# Patient Record
Sex: Male | Born: 1935 | Race: White | Hispanic: No | Marital: Married | Smoking: Former smoker
Health system: Southern US, Community
[De-identification: ages and names within clinical notes are randomized; demographics above are authoritative.]

## PROBLEM LIST (undated history)

## (undated) DIAGNOSIS — R262 Difficulty in walking, not elsewhere classified: Secondary | ICD-10-CM

## (undated) DIAGNOSIS — K219 Gastro-esophageal reflux disease without esophagitis: Secondary | ICD-10-CM

## (undated) DIAGNOSIS — J189 Pneumonia, unspecified organism: Secondary | ICD-10-CM

## (undated) DIAGNOSIS — D649 Anemia, unspecified: Secondary | ICD-10-CM

## (undated) DIAGNOSIS — I10 Essential (primary) hypertension: Secondary | ICD-10-CM

## (undated) DIAGNOSIS — I2699 Other pulmonary embolism without acute cor pulmonale: Secondary | ICD-10-CM

## (undated) DIAGNOSIS — M6281 Muscle weakness (generalized): Secondary | ICD-10-CM

## (undated) DIAGNOSIS — N390 Urinary tract infection, site not specified: Secondary | ICD-10-CM

## (undated) DIAGNOSIS — N3041 Irradiation cystitis with hematuria: Secondary | ICD-10-CM

## (undated) DIAGNOSIS — N400 Enlarged prostate without lower urinary tract symptoms: Secondary | ICD-10-CM

## (undated) DIAGNOSIS — C61 Malignant neoplasm of prostate: Secondary | ICD-10-CM

## (undated) DIAGNOSIS — M1990 Unspecified osteoarthritis, unspecified site: Secondary | ICD-10-CM

## (undated) HISTORY — DX: Muscle weakness (generalized): M62.81

## (undated) HISTORY — DX: Urinary tract infection, site not specified: N39.0

## (undated) HISTORY — DX: Malignant neoplasm of prostate: C61

## (undated) HISTORY — DX: Irradiation cystitis with hematuria: N30.41

## (undated) HISTORY — PX: TOTAL KNEE ARTHROPLASTY: SHX125

## (undated) HISTORY — DX: Gastro-esophageal reflux disease without esophagitis: K21.9

## (undated) HISTORY — DX: Pneumonia, unspecified organism: J18.9

## (undated) HISTORY — DX: Other pulmonary embolism without acute cor pulmonale: I26.99

## (undated) HISTORY — DX: Difficulty in walking, not elsewhere classified: R26.2

## (undated) HISTORY — DX: Benign prostatic hyperplasia without lower urinary tract symptoms: N40.0

## (undated) HISTORY — DX: Anemia, unspecified: D64.9

## (undated) SURGERY — IVC FILTER INSERTION
Anesthesia: Moderate Sedation

---
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
## 1997-10-18 ENCOUNTER — Encounter: Admission: RE | Admit: 1997-10-18 | Discharge: 1998-01-16 | Payer: Self-pay | Admitting: Internal Medicine

## 1997-10-18 ENCOUNTER — Encounter: Admission: RE | Admit: 1997-10-18 | Discharge: 1998-01-16 | Payer: Self-pay | Admitting: Orthopedic Surgery

## 1999-01-15 ENCOUNTER — Other Ambulatory Visit: Admission: RE | Admit: 1999-01-15 | Discharge: 1999-01-15 | Payer: Self-pay | Admitting: *Deleted

## 1999-02-06 ENCOUNTER — Encounter: Admission: RE | Admit: 1999-02-06 | Discharge: 1999-02-17 | Payer: Self-pay | Admitting: Radiation Oncology

## 1999-02-18 ENCOUNTER — Encounter: Admission: RE | Admit: 1999-02-18 | Discharge: 1999-05-19 | Payer: Self-pay | Admitting: Radiation Oncology

## 2003-04-18 ENCOUNTER — Encounter: Payer: Self-pay | Admitting: *Deleted

## 2003-04-18 ENCOUNTER — Encounter: Admission: RE | Admit: 2003-04-18 | Discharge: 2003-04-18 | Payer: Self-pay | Admitting: *Deleted

## 2003-04-20 ENCOUNTER — Ambulatory Visit (HOSPITAL_BASED_OUTPATIENT_CLINIC_OR_DEPARTMENT_OTHER): Admission: RE | Admit: 2003-04-20 | Discharge: 2003-04-20 | Payer: Self-pay | Admitting: *Deleted

## 2003-04-20 ENCOUNTER — Ambulatory Visit (HOSPITAL_COMMUNITY): Admission: RE | Admit: 2003-04-20 | Discharge: 2003-04-20 | Payer: Self-pay | Admitting: *Deleted

## 2004-08-25 ENCOUNTER — Inpatient Hospital Stay: Payer: Self-pay | Admitting: General Practice

## 2004-08-29 ENCOUNTER — Encounter: Payer: Self-pay | Admitting: Internal Medicine

## 2004-09-23 ENCOUNTER — Encounter: Payer: Self-pay | Admitting: General Practice

## 2005-06-14 ENCOUNTER — Emergency Department: Payer: Self-pay | Admitting: Unknown Physician Specialty

## 2005-06-16 ENCOUNTER — Ambulatory Visit: Payer: Self-pay | Admitting: Urology

## 2007-08-09 ENCOUNTER — Other Ambulatory Visit: Payer: Self-pay

## 2007-08-09 ENCOUNTER — Ambulatory Visit: Payer: Self-pay | Admitting: General Practice

## 2007-08-22 ENCOUNTER — Inpatient Hospital Stay: Payer: Self-pay | Admitting: General Practice

## 2007-08-26 ENCOUNTER — Encounter: Payer: Self-pay | Admitting: Internal Medicine

## 2008-08-20 ENCOUNTER — Encounter: Admission: RE | Admit: 2008-08-20 | Payer: Medicare Other | Source: Ambulatory Visit | Admitting: Internal Medicine

## 2008-12-20 ENCOUNTER — Ambulatory Visit: Payer: Self-pay | Admitting: Ophthalmology

## 2009-01-02 ENCOUNTER — Ambulatory Visit: Payer: Self-pay | Admitting: Ophthalmology

## 2009-06-05 ENCOUNTER — Ambulatory Visit: Payer: Self-pay | Admitting: Ophthalmology

## 2009-06-18 ENCOUNTER — Ambulatory Visit: Payer: Self-pay | Admitting: Ophthalmology

## 2012-01-05 ENCOUNTER — Ambulatory Visit: Payer: Self-pay | Admitting: Internal Medicine

## 2012-01-18 ENCOUNTER — Ambulatory Visit: Payer: Self-pay | Admitting: Physical Medicine and Rehabilitation

## 2012-02-24 ENCOUNTER — Emergency Department: Payer: Self-pay | Admitting: Emergency Medicine

## 2012-02-24 LAB — CK TOTAL AND CKMB (NOT AT ARMC)
CK, Total: 74 U/L (ref 35–232)
CK-MB: 1.4 ng/mL (ref 0.5–3.6)

## 2012-02-24 LAB — URINALYSIS, COMPLETE
Bacteria: NONE SEEN
Bilirubin,UR: NEGATIVE
Blood: NEGATIVE
Glucose,UR: NEGATIVE mg/dL (ref 0–75)
Ketone: NEGATIVE
Leukocyte Esterase: NEGATIVE
Nitrite: NEGATIVE
Ph: 7 (ref 4.5–8.0)
Protein: NEGATIVE
RBC,UR: 2 /HPF (ref 0–5)
Specific Gravity: 1.009 (ref 1.003–1.030)
Squamous Epithelial: <1
WBC UR: 1 /HPF (ref 0–5)

## 2012-02-24 LAB — COMPREHENSIVE METABOLIC PANEL
Albumin: 2.6 g/dL — ABNORMAL LOW (ref 3.4–5.0)
Alkaline Phosphatase: 116 U/L (ref 50–136)
Anion Gap: 9 (ref 7–16)
BUN: 18 mg/dL (ref 7–18)
Bilirubin,Total: 0.5 mg/dL (ref 0.2–1.0)
Calcium, Total: 8.4 mg/dL — ABNORMAL LOW (ref 8.5–10.1)
Chloride: 101 mmol/L (ref 98–107)
Co2: 28 mmol/L (ref 21–32)
Creatinine: 0.91 mg/dL (ref 0.60–1.30)
EGFR (African American): >60
EGFR (Non-African Amer.): >60
Glucose: 100 mg/dL — ABNORMAL HIGH (ref 65–99)
Osmolality: 278 (ref 275–301)
Potassium: 3.5 mmol/L (ref 3.5–5.1)
SGOT(AST): 19 U/L (ref 15–37)
SGPT (ALT): 29 U/L (ref 12–78)
Sodium: 138 mmol/L (ref 136–145)
Total Protein: 5.8 g/dL — ABNORMAL LOW (ref 6.4–8.2)

## 2012-02-24 LAB — CBC
HCT: 33.4 % — ABNORMAL LOW (ref 40.0–52.0)
HGB: 11.5 g/dL — ABNORMAL LOW (ref 13.0–18.0)
MCH: 32 pg (ref 26.0–34.0)
MCHC: 34.5 g/dL (ref 32.0–36.0)
MCV: 93 fL (ref 80–100)
Platelet: 394 10*3/uL (ref 150–440)
RBC: 3.6 10*6/uL — ABNORMAL LOW (ref 4.40–5.90)
RDW: 14.5 % (ref 11.5–14.5)
WBC: 8.5 10*3/uL (ref 3.8–10.6)

## 2012-02-24 LAB — TSH: Thyroid Stimulating Horm: 3.14 u[IU]/mL

## 2012-02-24 LAB — PRO B NATRIURETIC PEPTIDE: B-Type Natriuretic Peptide: 598 pg/mL — ABNORMAL HIGH (ref 0–450)

## 2012-02-24 LAB — TROPONIN I: Troponin-I: <0.02 ng/mL

## 2012-04-05 ENCOUNTER — Ambulatory Visit: Payer: Self-pay | Admitting: Neurology

## 2015-12-11 ENCOUNTER — Encounter: Payer: Self-pay | Admitting: Emergency Medicine

## 2015-12-11 ENCOUNTER — Emergency Department: Payer: Medicare Other

## 2015-12-11 ENCOUNTER — Inpatient Hospital Stay
Admission: EM | Admit: 2015-12-11 | Discharge: 2015-12-14 | DRG: 699 | Disposition: A | Discharge status: Skilled Nursing Facility | Payer: Medicare Other | Attending: Internal Medicine | Admitting: Internal Medicine

## 2015-12-11 DIAGNOSIS — X58XXXA Exposure to other specified factors, initial encounter: Secondary | ICD-10-CM | POA: Diagnosis present

## 2015-12-11 DIAGNOSIS — Z8546 Personal history of malignant neoplasm of prostate: Secondary | ICD-10-CM

## 2015-12-11 DIAGNOSIS — Z79899 Other long term (current) drug therapy: Secondary | ICD-10-CM

## 2015-12-11 DIAGNOSIS — K219 Gastro-esophageal reflux disease without esophagitis: Secondary | ICD-10-CM | POA: Diagnosis present

## 2015-12-11 DIAGNOSIS — S3282XA Multiple fractures of pelvis without disruption of pelvic ring, initial encounter for closed fracture: Secondary | ICD-10-CM | POA: Diagnosis present

## 2015-12-11 DIAGNOSIS — Z8249 Family history of ischemic heart disease and other diseases of the circulatory system: Secondary | ICD-10-CM | POA: Diagnosis not present

## 2015-12-11 DIAGNOSIS — N3289 Other specified disorders of bladder: Secondary | ICD-10-CM | POA: Diagnosis present

## 2015-12-11 DIAGNOSIS — Z7902 Long term (current) use of antithrombotics/antiplatelets: Secondary | ICD-10-CM | POA: Diagnosis not present

## 2015-12-11 DIAGNOSIS — N3041 Irradiation cystitis with hematuria: Secondary | ICD-10-CM | POA: Diagnosis present

## 2015-12-11 DIAGNOSIS — R319 Hematuria, unspecified: Secondary | ICD-10-CM | POA: Diagnosis present

## 2015-12-11 DIAGNOSIS — K627 Radiation proctitis: Secondary | ICD-10-CM | POA: Diagnosis present

## 2015-12-11 DIAGNOSIS — R338 Other retention of urine: Secondary | ICD-10-CM

## 2015-12-11 DIAGNOSIS — I2510 Atherosclerotic heart disease of native coronary artery without angina pectoris: Secondary | ICD-10-CM | POA: Diagnosis present

## 2015-12-11 DIAGNOSIS — N3040 Irradiation cystitis without hematuria: Secondary | ICD-10-CM | POA: Diagnosis present

## 2015-12-11 DIAGNOSIS — Y842 Radiological procedure and radiotherapy as the cause of abnormal reaction of the patient, or of later complication, without mention of misadventure at the time of the procedure: Secondary | ICD-10-CM | POA: Diagnosis present

## 2015-12-11 DIAGNOSIS — Z833 Family history of diabetes mellitus: Secondary | ICD-10-CM | POA: Diagnosis not present

## 2015-12-11 DIAGNOSIS — N281 Cyst of kidney, acquired: Secondary | ICD-10-CM | POA: Diagnosis present

## 2015-12-11 DIAGNOSIS — D500 Iron deficiency anemia secondary to blood loss (chronic): Secondary | ICD-10-CM | POA: Diagnosis present

## 2015-12-11 DIAGNOSIS — I10 Essential (primary) hypertension: Secondary | ICD-10-CM | POA: Diagnosis present

## 2015-12-11 DIAGNOSIS — N3941 Urge incontinence: Secondary | ICD-10-CM | POA: Diagnosis not present

## 2015-12-11 DIAGNOSIS — N359 Urethral stricture, unspecified: Secondary | ICD-10-CM | POA: Diagnosis present

## 2015-12-11 DIAGNOSIS — R310 Gross hematuria: Secondary | ICD-10-CM

## 2015-12-11 DIAGNOSIS — Z923 Personal history of irradiation: Secondary | ICD-10-CM | POA: Diagnosis not present

## 2015-12-11 DIAGNOSIS — M1990 Unspecified osteoarthritis, unspecified site: Secondary | ICD-10-CM | POA: Diagnosis present

## 2015-12-11 DIAGNOSIS — Z87891 Personal history of nicotine dependence: Secondary | ICD-10-CM

## 2015-12-11 HISTORY — DX: Essential (primary) hypertension: I10

## 2015-12-11 HISTORY — DX: Unspecified osteoarthritis, unspecified site: M19.90

## 2015-12-11 LAB — CBC WITH DIFFERENTIAL/PLATELET
BASOS ABS: 0 10*3/uL (ref 0–0.1)
Basophils Relative: 0 %
Eosinophils Absolute: 0.1 10*3/uL (ref 0–0.7)
Eosinophils Relative: 1 %
HEMATOCRIT: 30.3 % — AB (ref 40.0–52.0)
Hemoglobin: 10.1 g/dL — ABNORMAL LOW (ref 13.0–18.0)
LYMPHS ABS: 1.2 10*3/uL (ref 1.0–3.6)
LYMPHS PCT: 20 %
MCH: 28.7 pg (ref 26.0–34.0)
MCHC: 33.3 g/dL (ref 32.0–36.0)
MCV: 86.2 fL (ref 80.0–100.0)
MONO ABS: 0.6 10*3/uL (ref 0.2–1.0)
MONOS PCT: 11 %
NEUTROS ABS: 4.2 10*3/uL (ref 1.4–6.5)
Neutrophils Relative %: 68 %
Platelets: 335 10*3/uL (ref 150–440)
RBC: 3.52 MIL/uL — ABNORMAL LOW (ref 4.40–5.90)
RDW: 15.7 % — AB (ref 11.5–14.5)
WBC: 6.1 10*3/uL (ref 3.8–10.6)

## 2015-12-11 LAB — TYPE AND SCREEN
ABO/RH(D): O POS
Antibody Screen: NEGATIVE

## 2015-12-11 LAB — CBC
HCT: 30.5 % — ABNORMAL LOW (ref 40.0–52.0)
Hemoglobin: 10.2 g/dL — ABNORMAL LOW (ref 13.0–18.0)
MCH: 28.9 pg (ref 26.0–34.0)
MCHC: 33.3 g/dL (ref 32.0–36.0)
MCV: 86.5 fL (ref 80.0–100.0)
PLATELETS: 350 10*3/uL (ref 150–440)
RBC: 3.53 MIL/uL — ABNORMAL LOW (ref 4.40–5.90)
RDW: 15.9 % — ABNORMAL HIGH (ref 11.5–14.5)
WBC: 6.2 10*3/uL (ref 3.8–10.6)

## 2015-12-11 LAB — URINALYSIS COMPLETE WITH MICROSCOPIC (ARMC ONLY)
Bacteria, UA: NONE SEEN
SPECIFIC GRAVITY, URINE: 1.015 (ref 1.005–1.030)
Squamous Epithelial / LPF: NONE SEEN
WBC, UA: NONE SEEN WBC/hpf (ref 0–5)

## 2015-12-11 LAB — BASIC METABOLIC PANEL
Anion gap: 10 (ref 5–15)
BUN: 28 mg/dL — ABNORMAL HIGH (ref 6–20)
CALCIUM: 8.6 mg/dL — AB (ref 8.9–10.3)
CO2: 22 mmol/L (ref 22–32)
CREATININE: 0.9 mg/dL (ref 0.61–1.24)
Chloride: 100 mmol/L — ABNORMAL LOW (ref 101–111)
GFR calc Af Amer: >60 mL/min (ref >60)
GFR calc non Af Amer: >60 mL/min (ref >60)
GLUCOSE: 108 mg/dL — AB (ref 65–99)
Potassium: 3.8 mmol/L (ref 3.5–5.1)
Sodium: 132 mmol/L — ABNORMAL LOW (ref 135–145)

## 2015-12-11 LAB — PROTIME-INR
INR: 1.14
Prothrombin Time: 14.8 seconds (ref 11.4–15.0)

## 2015-12-11 MED ORDER — HYDRALAZINE HCL 20 MG/ML IJ SOLN
10.0000 mg | Freq: Four times a day (QID) | INTRAMUSCULAR | Status: DC | PRN
  Filled 2015-12-11: qty 0.5

## 2015-12-11 NOTE — ED Notes (Signed)
Dr. Junious Silk at bedside with patient.

## 2015-12-11 NOTE — ED Notes (Signed)
Patient has h/o prostate cancer and does self catheters.  Patient started having bleeding today when he completed a self cath.  Patient called his [place] coordinator who told him to come here and then have the hospital transfer him to the [facility]

## 2015-12-11 NOTE — H&P (Signed)
[place] [facility] [place] NAME: Geoffrey Barber    MR#:  [id number]  DATE OF BIRTH:  August 06, 1924  DATE OF ADMISSION:  12/11/2015  PRIMARY CARE PHYSICIAN: BABAOFF, Caryl Bis, MD   REQUESTING/REFERRING PHYSICIAN: dr Marcelene Butte  CHIEF COMPLAINT:   Blood in the urine since last night HISTORY OF PRESENT ILLNESS:  Geoffrey Barber  is a 82 y.o. male with a known history of Prostate cancer, hypertension, arthritis comes to the emergency room after he started noticing hematuria since last time. Patient intermittently does self-catheterization at home started noticing some hematuria did self-catheterization 5 times today and it up getting significant amount of hematuria and got 3000 cc of CBI in the emergency room. Patient's Foley catheter was difficult to balloon inflation hands urology was consulted and is planning on doing a cystoscopy and thereafter a Foley catheter placement in the ER. Patient is asymptomatic other than some discomfort locally in the perineal area. Denies any vomiting nausea or any symptoms of hematuria in the past.  No family members present during my evaluation.  PAST MEDICAL HISTORY:   Past Medical History  Diagnosis Date  . Cancer ([place])   . Hypertension   . Arthritis     PAST SURGICAL HISTOIRY:  No past surgical history on file.  SOCIAL HISTORY:   Social History  Substance Use Topics  . Smoking status: Former Research scientist (life sciences)  . Smokeless tobacco: Not on file  . Alcohol Use: No    FAMILY HISTORY:  Hypertension, diabetes  DRUG ALLERGIES:  No Known Allergies  REVIEW OF SYSTEMS:  Review of Systems  Constitutional: Negative for fever, chills and weight loss.  HENT: Negative for ear discharge, ear pain and nosebleeds.   Eyes: Negative for blurred vision, pain and discharge.  Respiratory: Negative for sputum production, shortness of breath, wheezing and stridor.   Cardiovascular: Negative for chest pain, palpitations, orthopnea  and PND.  Gastrointestinal: Negative for nausea, vomiting, abdominal pain and diarrhea.  Genitourinary: Positive for hematuria. Negative for urgency and frequency.  Musculoskeletal: Negative for back pain and joint pain.  Neurological: Negative for sensory change, speech change, focal weakness and weakness.  Psychiatric/Behavioral: Negative for depression and hallucinations. The patient is not nervous/anxious.   All other systems reviewed and are negative.    MEDICATIONS AT HOME:   Prior to Admission medications   Medication Sig Start Date End Date Taking? Authorizing Provider  acetaminophen (TYLENOL) 325 MG tablet Take 650 mg by mouth every 6 (six) hours as needed for mild pain.   Yes Historical Provider, MD  amLODipine (NORVASC) 10 MG tablet Take 10 mg by mouth daily.   Yes Historical Provider, MD  Cholecalciferol (VITAMIN D3) 1000 units CAPS Take 1 capsule by mouth daily.   Yes Historical Provider, MD  clopidogrel (PLAVIX) 75 MG tablet Take 75 mg by mouth daily.   Yes Historical Provider, MD  dapsone 25 MG tablet Take 25 mg by mouth every other day.   Yes Historical Provider, MD  hydrochlorothiazide (HYDRODIURIL) 25 MG tablet Take 25 mg by mouth daily.   Yes Historical Provider, MD  meloxicam (MOBIC) 15 MG tablet Take 15 mg by mouth daily.   Yes Historical Provider, MD  omeprazole (PRILOSEC) 20 MG capsule Take 20 mg by mouth daily.   Yes Historical Provider, MD  potassium chloride SA (K-DUR,KLOR-CON) 20 MEQ tablet Take 20 mEq by mouth daily.   Yes Historical Provider, MD  terazosin (HYTRIN) 5 MG capsule Take 5 mg by mouth  every evening.   Yes Historical Provider, MD  traMADol (ULTRAM) 50 MG tablet Take 50 mg by mouth every 6 (six) hours as needed.   Yes Historical Provider, MD      VITAL SIGNS:  Blood pressure 154/84, pulse 73, temperature 98.4 F (36.9 C), temperature source Oral, resp. rate 18, height 6' (1.829 m), weight 99.791 kg (220 lb), SpO2 95 %.  PHYSICAL EXAMINATION:   GENERAL:  82 y.o.-year-old patient lying in the bed with no acute distress.  EYES: Pupils equal, round, reactive to light and accommodation. No scleral icterus. Extraocular muscles intact.  HEENT: Head atraumatic, normocephalic. Oropharynx and nasopharynx clear.  NECK:  Supple, no jugular venous distention. No thyroid enlargement, no tenderness.  LUNGS: Normal breath sounds bilaterally, no wheezing, rales,rhonchi or crepitation. No use of accessory muscles of respiration.  CARDIOVASCULAR: S1, S2 normal. No murmurs, rubs, or gallops.  ABDOMEN: Soft, nontender, nondistended. Bowel sounds present. No organomegaly or mass.  EXTREMITIES: No pedal edema, cyanosis, or clubbing.  NEUROLOGIC: Cranial nerves II through XII are intact. Muscle strength 5/5 in all extremities. Sensation intact. Gait not checked.  PSYCHIATRIC: The patient is alert and oriented x 3.  SKIN: No obvious rash, lesion, or ulcer.   LABORATORY PANEL:   CBC  Recent Labs Lab 12/11/15 2146  WBC 6.1  HGB 10.1*  HCT 30.3*  PLT 335   ------------------------------------------------------------------------------------------------------------------  Chemistries   Recent Labs Lab 12/11/15 1651  NA 132*  K 3.8  CL 100*  CO2 22  GLUCOSE 108*  BUN 28*  CREATININE 0.90  CALCIUM 8.6*   ------------------------------------------------------------------------------------------------------------------  Cardiac Enzymes No results for input(s): TROPONINI in the last 168 hours. ------------------------------------------------------------------------------------------------------------------  RADIOLOGY:  Ct Renal Stone Study  12/11/2015  CLINICAL DATA:  History of prostate cancer and perform self catheterization. Bleeding on self catheterization today. EXAM: CT ABDOMEN AND PELVIS WITHOUT CONTRAST TECHNIQUE: Multidetector CT imaging of the abdomen and pelvis was performed following the standard protocol without IV contrast.  COMPARISON:  CT 06/16/2005 as well as pelvic MRI 01/18/2012 FINDINGS: Lung bases demonstrate minimal scarring/ atelectasis over the lingula and left base. Calcified plaque over the left anterior descending coronary artery. Abdominal images demonstrate a few sub cm liver hypodensity is too small to characterize but likely cysts. The gallbladder, spleen, pancreas and adrenal glands are within normal. Kidneys are normal in size without evidence of hydronephrosis. Subtle smooth shaped stranding of the perirenal fat. Possible subtle faint cortical calcification upper pole right kidney. There is 6.4 cm simple cyst over the lower pole right kidney. Ureters are within normal. There is calcified plaque over the abdominal aorta iliac arteries. Appendix is normal. Colon is within normal. Small bowel is unremarkable. Pelvic images demonstrate a a focal dependent region of hyperdensity along the posterior bladder wall measuring 1.3 x 2.7 cm in AP and transverse dimension. This likely represents focal hemorrhage, although cannot exclude a mass. Rectum is normal. There is no free fluid or adenopathy. Examination demonstrates chronic displaced fracture of the right superior pubic ramus and bilateral symphysis including junction of inferior pubic rami to the symphysis. Old right inferior pubic ramus fracture. These findings were present on the previous MRI. The There is a nondisplaced fracture across the left superior acetabulum vessels in minimal displaced slightly comminuted fracture of the superior right acetabulum as these likely acute to subacute in nature. There are moderate degenerative changes of the spine with multilevel disc disease over the lumbar spine. Moderate degenerative change of the hips. IMPRESSION: 1.3 x 2.7  cm hyperdense collection along the posterior dependent portion of the bladder likely a focal region of hemorrhage, although cannot exclude a mass. No adenopathy. No renal/ureteral stones or obstruction. 6.4  cm right renal cyst. Few small subcentimeter liver hypodensities likely cysts or hemangiomas. Displaced somewhat comminuted chronic fracture involving the right superior ramus and bilateral symphysis including the junction of the inferior pubic rami bilaterally. Acute to subacute fracture left superior acetabulum without significant displacement as well as right acetabulum fracture with mild displacement and comminution. Atherosclerotic coronary artery disease. Electronically Signed   By: Marin Olp M.D.   On: 12/11/2015 20:28    EKG:    IMPRESSION AND PLAN:   Geoffrey Barber  is a 82 y.o. male with a known history of Prostate cancer, hypertension, arthritis comes to the emergency room after he started noticing hematuria since last time. Patient intermittently does self-catheterization at home started noticing some hematuria did self-catheterization 5 times today and it up getting significant amount of hematuria and got 3000 cc of CBI in the emergency room. P  1. Gross hematuria with abnormal CT scan suggestive of either bladder mass or focal hemorrhage -Admit to medical floor -Further recommendations per urology -Patient received CBI of 3 L -H&H every 12 hourly transfuse if hemoglobin less than 7 -Hold off Plavix  2. Hypertension -Continue hydrochlorothiazide and amlodipine  3. History of prostate cancer patient follows at the [place]  4. GERD continue PPI  5. DVT prophylaxis SCD and teds No antiplatelet secondary to hematuria  All the records are reviewed and case discussed with ED provider. Management plans discussed with the patient, family and they are in agreement.  CODE STATUS: full  TOTAL TIME TAKING CARE OF THIS PATIENT: 50 minutes.    Geoffrey Barber M.D on 12/11/2015 at 10:59 PM  Between 7am to 6pm - Pager - [phone]  After 6pm go to [email or web address] - password [email or web address]  Office  [phone]  CC: Primary care physician; BABAOFF, Caryl Bis,  MD

## 2015-12-11 NOTE — ED Notes (Signed)
Pt presents with blood in his urine started last night. Pt with hx of same. Pt with hx of prostate cancer.

## 2015-12-11 NOTE — Consult Note (Signed)
Consult: gross hematuria  Requested by: Dr. Marcelene Butte  History of Present Illness: 82 year old with history of radiation therapy for prostate cancer, urethral stricture and gross hematuria he developed gross hematuria earlier today. He had trouble emptying his bladder and tried to do CIC which is done in the past for urethral stricture disease but did not drain any of the urine. He came to the hospital and a three-way catheter was placed per urethra and the bladder irrigated but the balloon could not be inflated so it was removed. Patient currently feels an urge to void and is having urge incontinence. He underwent a noncontrast CT scan of the abdomen and pelvis which revealed a small clot in the bladder and otherwise benign GU tract. I reviewed the images. Is kidney function was normal. Hemoglobin of 10 with a hemoglobin of 11.5 three yrs ago. No dysuria or fever.  As above he reports a history of radiation for prostate cancer many years ago. He thinks a PSA was checked recently at the [place] and noted to be normal. He saw Dr. Jacqlyn Larsen in 2015. A PSA was less than 0.1 at that time. Also he reports he's had urethral dilations in the past for urethral stricture. He reports gross hematuria about 3 years ago and cystoscopy at the [place] which he was told was radiation cystitis but does not recall urethral dilation at that time.  Past Medical History  Diagnosis Date  . Cancer ([place])   . Hypertension   . Arthritis    No past surgical history on file.  Home Medications:   (Not in a hospital admission) Allergies: No Known Allergies  No family history on file. Social History:  reports that he has quit smoking. He does not have any smokeless tobacco history on file. He reports that he does not drink alcohol. His drug history is not on file.  ROS: A complete review of systems was performed.  All systems are negative except for pertinent findings as noted. Review of Systems  All other systems reviewed and are  negative.    Physical Exam:  Vital signs in last 24 hours: Temp:  [98.4 F (36.9 C)] 98.4 F (36.9 C) (05/24 1640) Pulse Rate:  [74] 74 (05/24 1640) Resp:  [18] 18 (05/24 1640) BP: (143)/(87) 143/87 mmHg (05/24 1640) SpO2:  [98 %] 98 % (05/24 1640) Weight:  [99.791 kg (220 lb)] 99.791 kg (220 lb) (05/24 1641) General:  Alert and oriented, No acute distress HEENT: Normocephalic, atraumatic Neck: No JVD or lymphadenopathy Cardiovascular: Regular rate and rhythm Lungs: Regular rate and effort Abdomen: Soft, nontender, nondistended, no abdominal masses Extremities: + edema Neurologic: Grossly intact  Procedure: The penis was prepped and an 15 [place] coud catheter was placed without difficulty but there was some resistance going through the prostate. The balloon was inflated and seated at the bladder neck. I irrigated the bladder to clear getting out several small clots. The catheter was left to gravity drainage.  Laboratory Data:  Results for orders placed or performed during the hospital encounter of 12/11/15 (from the past 24 hour(s))  Urinalysis complete, with microscopic- may I&O cath if menses     Status: Abnormal   Collection Time: 12/11/15  4:13 PM  Result Value Ref Range   Color, Urine RED (A) YELLOW   APPearance CLOUDY (A) CLEAR   Glucose, UA (A) NEGATIVE mg/dL    TEST NOT REPORTED DUE TO COLOR INTERFERENCE OF URINE PIGMENT   Bilirubin Urine (A) NEGATIVE    TEST NOT  REPORTED DUE TO COLOR INTERFERENCE OF URINE PIGMENT   Ketones, ur (A) NEGATIVE mg/dL    TEST NOT REPORTED DUE TO COLOR INTERFERENCE OF URINE PIGMENT   Specific Gravity, Urine 1.015 1.005 - 1.030   Hgb urine dipstick (A) NEGATIVE    TEST NOT REPORTED DUE TO COLOR INTERFERENCE OF URINE PIGMENT   pH  5.0 - 8.0    TEST NOT REPORTED DUE TO COLOR INTERFERENCE OF URINE PIGMENT   Protein, ur (A) NEGATIVE mg/dL    TEST NOT REPORTED DUE TO COLOR INTERFERENCE OF URINE PIGMENT   Nitrite (A) NEGATIVE    TEST NOT  REPORTED DUE TO COLOR INTERFERENCE OF URINE PIGMENT   Leukocytes, UA (A) NEGATIVE    TEST NOT REPORTED DUE TO COLOR INTERFERENCE OF URINE PIGMENT   RBC / HPF TOO NUMEROUS TO COUNT 0 - 5 RBC/hpf   WBC, UA NONE SEEN 0 - 5 WBC/hpf   Bacteria, UA NONE SEEN NONE SEEN   Squamous Epithelial / LPF NONE SEEN NONE SEEN  Basic metabolic panel     Status: Abnormal   Collection Time: 12/11/15  4:51 PM  Result Value Ref Range   Sodium 132 (L) 135 - 145 mmol/L   Potassium 3.8 3.5 - 5.1 mmol/L   Chloride 100 (L) 101 - 111 mmol/L   CO2 22 22 - 32 mmol/L   Glucose, Bld 108 (H) 65 - 99 mg/dL   BUN 28 (H) 6 - 20 mg/dL   Creatinine, Ser 0.90 0.61 - 1.24 mg/dL   Calcium 8.6 (L) 8.9 - 10.3 mg/dL   GFR calc non Af Amer >60 >60 mL/min   GFR calc Af Amer >60 >60 mL/min   Anion gap 10 5 - 15  CBC     Status: Abnormal   Collection Time: 12/11/15  4:51 PM  Result Value Ref Range   WBC 6.2 3.8 - 10.6 K/uL   RBC 3.53 (L) 4.40 - 5.90 MIL/uL   Hemoglobin 10.2 (L) 13.0 - 18.0 g/dL   HCT 30.5 (L) 40.0 - 52.0 %   MCV 86.5 80.0 - 100.0 fL   MCH 28.9 26.0 - 34.0 pg   MCHC 33.3 32.0 - 36.0 g/dL   RDW 15.9 (H) 11.5 - 14.5 %   Platelets 350 150 - 440 K/uL  CBC with Differential/Platelet     Status: Abnormal   Collection Time: 12/11/15  9:46 PM  Result Value Ref Range   WBC 6.1 3.8 - 10.6 K/uL   RBC 3.52 (L) 4.40 - 5.90 MIL/uL   Hemoglobin 10.1 (L) 13.0 - 18.0 g/dL   HCT 30.3 (L) 40.0 - 52.0 %   MCV 86.2 80.0 - 100.0 fL   MCH 28.7 26.0 - 34.0 pg   MCHC 33.3 32.0 - 36.0 g/dL   RDW 15.7 (H) 11.5 - 14.5 %   Platelets 335 150 - 440 K/uL   Neutrophils Relative % 68 %   Neutro Abs 4.2 1.4 - 6.5 K/uL   Lymphocytes Relative 20 %   Lymphs Abs 1.2 1.0 - 3.6 K/uL   Monocytes Relative 11 %   Monocytes Absolute 0.6 0.2 - 1.0 K/uL   Eosinophils Relative 1 %   Eosinophils Absolute 0.1 0 - 0.7 K/uL   Basophils Relative 0 %   Basophils Absolute 0.0 0 - 0.1 K/uL  Protime-INR     Status: None   Collection Time: 12/11/15   9:46 PM  Result Value Ref Range   Prothrombin Time 14.8 11.4 - 15.0 seconds  INR 1.14    No results found for this or any previous visit (from the past 240 hour(s)). 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 History of urethral stricture but 89 French coud catheter was able to be placed.  Plan:  Continue Foley catheter. If urine remains clear, foley could be discontinued for void trial. He'll need outpatient cystoscopy. Urology to follow.  Hilari Wethington 12/11/2015

## 2015-12-11 NOTE — ED Provider Notes (Signed)
Time Seen: Approximately 1830 I have reviewed the triage notes  Chief Complaint: Hematuria   History of Present Illness: Geoffrey Barber is a 82 y.o. male who presents with persistent hematuria. Patient states he started having some mild bleeding last night and then started having some heavy bleeding with urination. Patient self catheterizes himself and states that sometimes the catheter showed clogging up. The patient denies any lightheadedness. He denies much in way of abdominal pain. Patient self catheterize himself due to a history of prostate cancer that was treated with radiation pellets. The patient states he did not have any surgery on his prostate. He's had one previous episode of hematuria and had cystoscopy performed and they have did not have any lesions. The source of his pain was thought to be irritation from his previous  therapy for prostate cancer. Patient is not currently on any blood thinners. He denies any rectal bleeding.   Past Medical History  Diagnosis Date  . Cancer ([place])   . Hypertension   . Arthritis     There are no active problems to display for this patient.   No past surgical history on file.  No past surgical history on file.  Current Outpatient Rx  Name  Route  Sig  Dispense  Refill  . acetaminophen (TYLENOL) 325 MG tablet   Oral   Take 650 mg by mouth every 6 (six) hours as needed for mild pain.         Marland Kitchen amLODipine (NORVASC) 10 MG tablet   Oral   Take 10 mg by mouth daily.         . Cholecalciferol (VITAMIN D3) 1000 units CAPS   Oral   Take 1 capsule by mouth daily.         . clopidogrel (PLAVIX) 75 MG tablet   Oral   Take 75 mg by mouth daily.         . dapsone 25 MG tablet   Oral   Take 25 mg by mouth every other day.         . hydrochlorothiazide (HYDRODIURIL) 25 MG tablet   Oral   Take 25 mg by mouth daily.         . meloxicam (MOBIC) 15 MG tablet   Oral   Take 15 mg by mouth daily.         Marland Kitchen omeprazole  (PRILOSEC) 20 MG capsule   Oral   Take 20 mg by mouth daily.         . potassium chloride SA (K-DUR,KLOR-CON) 20 MEQ tablet   Oral   Take 20 mEq by mouth daily.         Marland Kitchen terazosin (HYTRIN) 5 MG capsule   Oral   Take 5 mg by mouth every evening.         . traMADol (ULTRAM) 50 MG tablet   Oral   Take 50 mg by mouth every 6 (six) hours as needed.           Allergies:  Review of patient's allergies indicates no known allergies.  Family History: No family history on file.  Social History: Social History  Substance Use Topics  . Smoking status: Former Research scientist (life sciences)  . Smokeless tobacco: None  . Alcohol Use: No     Review of Systems:   10 point review of systems was performed and was otherwise negative:  Constitutional: No fever Eyes: No visual disturbances ENT: No sore throat, ear pain Cardiac: No chest pain Respiratory:  No shortness of breath, wheezing, or stridor Abdomen: No abdominal pain, no vomiting, No diarrhea Endocrine: No weight loss, No night sweats Extremities: No peripheral edema, cyanosis Skin: No rashes, easy bruising Neurologic: No focal weakness, trouble with speech or swollowing Urologic: No dysuria, Hematuria, or urinary frequency   Physical Exam:  ED Triage Vitals  Enc Vitals Group     BP 12/11/15 1640 143/87 mmHg     Pulse Rate 12/11/15 1640 74     Resp 12/11/15 1640 18     Temp 12/11/15 1640 98.4 F (36.9 C)     Temp Source 12/11/15 1640 Oral     SpO2 12/11/15 1640 98 %     Weight 12/11/15 1641 220 lb (99.791 kg)     Height 12/11/15 1641 6' (1.829 m)     Head Cir --      Peak Flow --      Pain Score --      Pain Loc --      Pain Edu? --      Excl. in [place]? --     General: Awake , Alert , and Oriented times 3; GCS 15 Head: Normal cephalic , atraumatic Eyes: Pupils equal , round, reactive to light Nose/Throat: No nasal drainage, patent upper airway without erythema or exudate.  Neck: Supple, Full range of motion, No anterior  adenopathy or palpable thyroid masses Lungs: Clear to ascultation without wheezes , rhonchi, or rales Heart: Regular rate, regular rhythm without murmurs , gallops , or rubs Abdomen: Soft, non tender without rebound, guarding , or rigidity; bowel sounds positive and symmetric in all 4 quadrants. No organomegaly .        Extremities: 2 plus symmetric pulses. No edema, clubbing or cyanosis Neurologic: normal ambulation, Motor symmetric without deficits, sensory intact Skin: warm, dry, no rashes Examination of the penis shows some clotted blood at the tip of the meatus. He does not have any other skin lesions. No testicular pain. Labs:   All laboratory work was reviewed including any pertinent negatives or positives listed below:  Labs Reviewed  [place] ([place]) - Abnormal; Notable for the following:    Color, Urine RED (*)    APPearance CLOUDY (*)    Glucose, UA   (*)    Value: TEST NOT REPORTED DUE TO COLOR INTERFERENCE OF URINE PIGMENT   Bilirubin Urine   (*)    Value: TEST NOT REPORTED DUE TO COLOR INTERFERENCE OF URINE PIGMENT   Ketones, ur   (*)    Value: TEST NOT REPORTED DUE TO COLOR INTERFERENCE OF URINE PIGMENT   Hgb urine dipstick   (*)    Value: TEST NOT REPORTED DUE TO COLOR INTERFERENCE OF URINE PIGMENT   Protein, ur   (*)    Value: TEST NOT REPORTED DUE TO COLOR INTERFERENCE OF URINE PIGMENT   Nitrite   (*)    Value: TEST NOT REPORTED DUE TO COLOR INTERFERENCE OF URINE PIGMENT   Leukocytes, UA   (*)    Value: TEST NOT REPORTED DUE TO COLOR INTERFERENCE OF URINE PIGMENT   All other components within normal limits  BASIC METABOLIC PANEL - Abnormal; Notable for the following:    Sodium 132 (*)    Chloride 100 (*)    Glucose, Bld 108 (*)    BUN 28 (*)    Calcium 8.6 (*)    All other components within normal limits  CBC - Abnormal; Notable for the following:    RBC  3.53 (*)    Hemoglobin 10.2 (*)    HCT 30.5 (*)    RDW 15.9 (*)    All  other components within normal limits  CBC WITH DIFFERENTIAL/PLATELET  PROTIME-INR  TYPE AND SCREEN  Review of laboratory work shows some mild anemia compared to his previous hemoglobin level.   Radiology:     COMPARISON: CT 06/16/2005 as well as pelvic MRI 01/18/2012  FINDINGS: Lung bases demonstrate minimal scarring/ atelectasis over the lingula and left base. Calcified plaque over the left anterior descending coronary artery.  Abdominal images demonstrate a few sub cm liver hypodensity is too small to characterize but likely cysts. The gallbladder, spleen, pancreas and adrenal glands are within normal.  Kidneys are normal in size without evidence of hydronephrosis. Subtle smooth shaped stranding of the perirenal fat. Possible subtle faint cortical calcification upper pole right kidney. There is 6.4 cm simple cyst over the lower pole right kidney. Ureters are within normal.  There is calcified plaque over the abdominal aorta iliac arteries.  Appendix is normal. Colon is within normal. Small bowel is unremarkable.  Pelvic images demonstrate a a focal dependent region of hyperdensity along the posterior bladder wall measuring 1.3 x 2.7 cm in AP and transverse dimension. This likely represents focal hemorrhage, although cannot exclude a mass. Rectum is normal. There is no free fluid or adenopathy.  Examination demonstrates chronic displaced fracture of the right superior pubic ramus and bilateral symphysis including junction of inferior pubic rami to the symphysis. Old right inferior pubic ramus fracture. These findings were present on the previous MRI. The There is a nondisplaced fracture across the left superior acetabulum vessels in minimal displaced slightly comminuted fracture of the superior right acetabulum as these likely acute to subacute in nature.  There are moderate degenerative changes of the spine with multilevel disc disease over the lumbar spine.  Moderate degenerative change of the hips.  IMPRESSION: 1.3 x 2.7 cm hyperdense collection along the posterior dependent portion of the bladder likely a focal region of hemorrhage, although cannot exclude a mass. No adenopathy.  No renal/ureteral stones or obstruction.  6.4 cm right renal cyst.  Few small subcentimeter liver hypodensities likely cysts or hemangiomas.  Displaced somewhat comminuted chronic fracture involving the right superior ramus and bilateral symphysis including the junction of the inferior pubic rami bilaterally. Acute to subacute fracture left superior acetabulum without significant displacement as well as right acetabulum fracture with mild displacement and comminution. Atherosclerotic coronary artery disease.   Electronically Signed By: Marin Olp M.D. On: 12/11/2015 20:28   I personally reviewed the radiologic studies    ED Course: * The patient for his persistent hematuria had a three-way Foley inserted by the nursing staff and received 3 L of normal saline irrigation but still had persistent his hemoglobin is slightly lower compared to previous levels in the past and I felt required observation especially since he is still having the hematuria. Another concern is what may be some pathologic fractures noticed on his pelvis region given his history of prostate cancer. Patient was recently placed on narcotic pain medication and states he did not receive any x-ray evaluation. The patient is normally followed by the [facility] but states that he wishes to stay at our hospital for local treatment. He states if he was transferred to [facility] and would be difficult for him to get home here in [place]. Concerning his bleeding I discussed the case with Dr. Junious Silk who was on call for urology. He states he  would like to see and evaluate the patient . the Foley that was previously established was removed by the nursing staff after the bladder  irrigation.    Assessment: * Painless hematuria Multiple fractures of the pelvis      Plan:  Inpatient management          Code time  Daymon Larsen, MD 12/11/15 2217

## 2015-12-11 NOTE — ED Notes (Signed)
Patient moved to room 1. Patient alert and oriented. Awaiting ready bed. Hearing aids put in box and placed in jacket pocket in belongings bag. Glasses wrapped in paper towel, labeled and placed in belongings bag. Patient given warm blanket.

## 2015-12-12 ENCOUNTER — Encounter
Admission: RE | Admit: 2015-12-12 | Discharge: 2015-12-12 | Disposition: A | Discharge status: Home / Self Care | Payer: Medicare Other | Source: Ambulatory Visit | Attending: Internal Medicine | Admitting: Internal Medicine

## 2015-12-12 DIAGNOSIS — R319 Hematuria, unspecified: Secondary | ICD-10-CM

## 2015-12-12 LAB — CBC
HCT: 29.9 % — ABNORMAL LOW (ref 40.0–52.0)
Hemoglobin: 10.2 g/dL — ABNORMAL LOW (ref 13.0–18.0)
MCH: 28.7 pg (ref 26.0–34.0)
MCHC: 34.1 g/dL (ref 32.0–36.0)
MCV: 84.3 fL (ref 80.0–100.0)
PLATELETS: 322 10*3/uL (ref 150–440)
RBC: 3.55 MIL/uL — ABNORMAL LOW (ref 4.40–5.90)
RDW: 16.1 % — AB (ref 11.5–14.5)
WBC: 5.7 10*3/uL (ref 3.8–10.6)

## 2015-12-12 LAB — ABO/RH: ABO/RH(D): O POS

## 2015-12-12 MED ORDER — AMLODIPINE BESYLATE 10 MG PO TABS
10.0000 mg | ORAL_TABLET | Freq: Every day | ORAL | Status: DC
  Administered 2015-12-12 – 2015-12-14 (×3): 10 mg via ORAL
  Filled 2015-12-12 (×3): qty 1

## 2015-12-12 MED ORDER — ACETAMINOPHEN 325 MG PO TABS
650.0000 mg | ORAL_TABLET | Freq: Four times a day (QID) | ORAL | Status: DC | PRN
  Administered 2015-12-12 – 2015-12-14 (×4): 650 mg via ORAL
  Filled 2015-12-12 (×5): qty 2

## 2015-12-12 MED ORDER — HYDROCHLOROTHIAZIDE 25 MG PO TABS
25.0000 mg | ORAL_TABLET | Freq: Every day | ORAL | Status: DC
  Administered 2015-12-12 – 2015-12-14 (×3): 25 mg via ORAL
  Filled 2015-12-12 (×3): qty 1

## 2015-12-12 MED ORDER — DAPSONE 25 MG PO TABS
25.0000 mg | ORAL_TABLET | ORAL | Status: DC
  Administered 2015-12-12 – 2015-12-14 (×2): 25 mg via ORAL
  Filled 2015-12-12 (×2): qty 1

## 2015-12-12 MED ORDER — SODIUM CHLORIDE 0.9 % IV SOLN
INTRAVENOUS | Status: DC
  Administered 2015-12-12: 02:00:00 via INTRAVENOUS

## 2015-12-12 MED ORDER — PRESERVISION AREDS 2 PO CAPS
1.0000 | ORAL_CAPSULE | Freq: Two times a day (BID) | ORAL | Status: DC
  Administered 2015-12-12 – 2015-12-14 (×4): 1 via ORAL
  Filled 2015-12-12: qty 1

## 2015-12-12 MED ORDER — PANTOPRAZOLE SODIUM 40 MG PO TBEC
40.0000 mg | DELAYED_RELEASE_TABLET | Freq: Every day | ORAL | Status: DC
  Administered 2015-12-12 – 2015-12-14 (×3): 40 mg via ORAL
  Filled 2015-12-12 (×3): qty 1

## 2015-12-12 MED ORDER — POTASSIUM CHLORIDE CRYS ER 20 MEQ PO TBCR
20.0000 meq | EXTENDED_RELEASE_TABLET | Freq: Every day | ORAL | Status: DC
  Administered 2015-12-12 – 2015-12-14 (×3): 20 meq via ORAL
  Filled 2015-12-12 (×3): qty 1

## 2015-12-12 MED ORDER — VITAMIN D 1000 UNITS PO TABS
1000.0000 [IU] | ORAL_TABLET | Freq: Every day | ORAL | Status: DC
  Administered 2015-12-12 – 2015-12-14 (×3): 1000 [IU] via ORAL
  Filled 2015-12-12 (×3): qty 1

## 2015-12-12 MED ORDER — ONDANSETRON HCL 4 MG PO TABS: 4.0000 mg | ORAL_TABLET | Freq: Four times a day (QID) | ORAL | Status: DC | PRN

## 2015-12-12 MED ORDER — TERAZOSIN HCL 5 MG PO CAPS
5.0000 mg | ORAL_CAPSULE | Freq: Every evening | ORAL | Status: DC
  Administered 2015-12-12 – 2015-12-13 (×2): 5 mg via ORAL
  Filled 2015-12-12 (×5): qty 1

## 2015-12-12 MED ORDER — ONDANSETRON HCL 4 MG/2ML IJ SOLN: 4.0000 mg | Freq: Four times a day (QID) | INTRAMUSCULAR | Status: DC | PRN

## 2015-12-12 MED ORDER — TRAMADOL HCL 50 MG PO TABS
50.0000 mg | ORAL_TABLET | Freq: Four times a day (QID) | ORAL | Status: DC | PRN
  Administered 2015-12-13: 50 mg via ORAL
  Filled 2015-12-12: qty 1

## 2015-12-12 NOTE — Progress Notes (Signed)
[facility] NAME: Geoffrey Barber    MR#:  [id number]  DATE OF BIRTH:  1925/05/19  SUBJECTIVE:  CHIEF COMPLAINT:   Chief Complaint  Patient presents with  . Hematuria   Continues to have significant hematuria. Had no drainage and Foley due to clots in the morning and this improved after irrigation by urology. No abdominal pain.  REVIEW OF SYSTEMS:    Review of Systems  Constitutional: Negative for fever and chills.  HENT: Negative for sore throat.   Eyes: Negative for blurred vision, double vision and pain.  Respiratory: Negative for cough, hemoptysis, shortness of breath and wheezing.   Cardiovascular: Negative for chest pain, palpitations, orthopnea and leg swelling.  Gastrointestinal: Negative for heartburn, nausea, vomiting, abdominal pain, diarrhea and constipation.  Genitourinary: Positive for hematuria. Negative for dysuria.  Musculoskeletal: Negative for back pain and joint pain.  Skin: Negative for rash.  Neurological: Negative for sensory change, speech change, focal weakness and headaches.  Endo/Heme/Allergies: Does not bruise/bleed easily.  Psychiatric/Behavioral: Negative for depression. The patient is not nervous/anxious.     DRUG ALLERGIES:  No Known Allergies  VITALS:  Blood pressure 116/63, pulse 71, temperature 99.2 F (37.3 C), temperature source Oral, resp. rate 17, height 6' (1.829 m), weight 97.932 kg (215 lb 14.4 oz), SpO2 95 %.  PHYSICAL EXAMINATION:   Physical Exam  GENERAL:  82 y.o.-year-old patient lying in the bed with no acute distress.  EYES: Pupils equal, round, reactive to light and accommodation. No scleral icterus. Extraocular muscles intact.  HEENT: Head atraumatic, normocephalic. Oropharynx and nasopharynx clear.  NECK:  Supple, no jugular venous distention. No thyroid enlargement, no tenderness.  LUNGS: Normal breath sounds bilaterally, no wheezing, rales, rhonchi. No use of  accessory muscles of respiration.  CARDIOVASCULAR: S1, S2 normal. No murmurs, rubs, or gallops.  ABDOMEN: Soft, nontender, nondistended. Bowel sounds present. No organomegaly or mass.  EXTREMITIES: No cyanosis, clubbing or edema b/l.    NEUROLOGIC: Cranial nerves II through XII are intact. No focal Motor or sensory deficits b/l.   PSYCHIATRIC: The patient is alert and oriented x 3.  SKIN: No obvious rash, lesion, or ulcer.  Foley catheter in place with bloody urine  LABORATORY PANEL:   CBC  Recent Labs Lab 12/12/15 0439  WBC 5.7  HGB 10.2*  HCT 29.9*  PLT 322   ------------------------------------------------------------------------------------------------------------------ Chemistries   Recent Labs Lab 12/11/15 1651  NA 132*  K 3.8  CL 100*  CO2 22  GLUCOSE 108*  BUN 28*  CREATININE 0.90  CALCIUM 8.6*   ------------------------------------------------------------------------------------------------------------------  Cardiac Enzymes No results for input(s): TROPONINI in the last 168 hours. ------------------------------------------------------------------------------------------------------------------  RADIOLOGY:  Ct Renal Stone Study  12/11/2015  CLINICAL DATA:  History of prostate cancer and perform self catheterization. Bleeding on self catheterization today. EXAM: CT ABDOMEN AND PELVIS WITHOUT CONTRAST TECHNIQUE: Multidetector CT imaging of the abdomen and pelvis was performed following the standard protocol without IV contrast. COMPARISON:  CT 06/16/2005 as well as pelvic MRI 01/18/2012 FINDINGS: Lung bases demonstrate minimal scarring/ atelectasis over the lingula and left base. Calcified plaque over the left anterior descending coronary artery. Abdominal images demonstrate a few sub cm liver hypodensity is too small to characterize but likely cysts. The gallbladder, spleen, pancreas and adrenal glands are within normal. Kidneys are normal in size without evidence  of hydronephrosis. Subtle smooth shaped stranding of the perirenal fat. Possible subtle faint cortical calcification upper pole right kidney. There is  6.4 cm simple cyst over the lower pole right kidney. Ureters are within normal. There is calcified plaque over the abdominal aorta iliac arteries. Appendix is normal. Colon is within normal. Small bowel is unremarkable. Pelvic images demonstrate a a focal dependent region of hyperdensity along the posterior bladder wall measuring 1.3 x 2.7 cm in AP and transverse dimension. This likely represents focal hemorrhage, although cannot exclude a mass. Rectum is normal. There is no free fluid or adenopathy. Examination demonstrates chronic displaced fracture of the right superior pubic ramus and bilateral symphysis including junction of inferior pubic rami to the symphysis. Old right inferior pubic ramus fracture. These findings were present on the previous MRI. The There is a nondisplaced fracture across the left superior acetabulum vessels in minimal displaced slightly comminuted fracture of the superior right acetabulum as these likely acute to subacute in nature. There are moderate degenerative changes of the spine with multilevel disc disease over the lumbar spine. Moderate degenerative change of the hips. IMPRESSION: 1.3 x 2.7 cm hyperdense collection along the posterior dependent portion of the bladder likely a focal region of hemorrhage, although cannot exclude a mass. No adenopathy. No renal/ureteral stones or obstruction. 6.4 cm right renal cyst. Few small subcentimeter liver hypodensities likely cysts or hemangiomas. Displaced somewhat comminuted chronic fracture involving the right superior ramus and bilateral symphysis including the junction of the inferior pubic rami bilaterally. Acute to subacute fracture left superior acetabulum without significant displacement as well as right acetabulum fracture with mild displacement and comminution. Atherosclerotic  coronary artery disease. Electronically Signed   By: Marin Olp M.D.   On: 12/11/2015 20:28     ASSESSMENT AND PLAN:   Geoffrey Barber is a 82 y.o. male with a known history of Prostate cancer, hypertension, arthritis comes to the emergency room after he started noticing hematuria since last time. Patient intermittently does self-catheterization at home started noticing some hematuria did self-catheterization 5 times today and it up getting significant amount of hematuria and got 3000 cc of CBI in the emergency room. 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 -Admit to medical floor -Further recommendations per urology -Patient receiving bladder irrigation PRN - Follow Hb. Transfuse as needed -Hold  Plavix  # Hypertension -Continue hydrochlorothiazide and amlodipine  # History of prostate cancer patient follows at the [facility]  # GERD continue PPI  # DVT prophylaxis SCD and teds No anticoagulants secondary to hematuria  All the records are reviewed and case discussed with Care Management/Social Workerr. Management plans discussed with the patient, family and they are in agreement.  CODE STATUS: FULL CODE  DVT Prophylaxis: SCDs  TOTAL TIME TAKING CARE OF THIS PATIENT: 30 minutes.   POSSIBLE D/C IN  DAYS, DEPENDING ON CLINICAL CONDITION.  Hillary Bow R M.D on 12/12/2015 at 2:59 PM  Between 7am to 6pm - Pager - [phone]  After 6pm go to [email or web address] - password [email or web address] [facility]  Office  [phone]  CC: Primary care physician; BABAOFF, Caryl Bis, MD  Note: This dictation was prepared with Dragon dictation along with smaller phrase technology. Any transcriptional errors that result from this process are unintentional.

## 2015-12-12 NOTE — Progress Notes (Signed)
Urology Consult Follow Up  Subjective: Patient complaining of bladder spasms.  Staff states they had to irrigate several times during the night.  He is comfortable otherwise.  Hbg stable.  Foley in place, draining light red urine with small clots.    Anti-infectives: Anti-infectives    Start     Dose/Rate Route Frequency Ordered Stop   12/12/15 1000  dapsone tablet 25 mg     25 mg Oral Every other day 12/12/15 0041        Current Facility-Administered Medications  Medication Dose Route Frequency Provider Last Rate Last Dose  . amLODipine (NORVASC) tablet 10 mg  10 mg Oral Daily Fritzi Mandes, MD      . cholecalciferol (VITAMIN D) tablet 1,000 Units  1,000 Units Oral Daily Fritzi Mandes, MD      . dapsone tablet 25 mg  25 mg Oral QODAY Fritzi Mandes, MD      . hydrALAZINE (APRESOLINE) injection 10 mg  10 mg Intravenous Q6H PRN Fritzi Mandes, MD      . hydrochlorothiazide (HYDRODIURIL) tablet 25 mg  25 mg Oral Daily Fritzi Mandes, MD      . ondansetron (ZOFRAN) tablet 4 mg  4 mg Oral Q6H PRN Fritzi Mandes, MD       Or  . ondansetron (ZOFRAN) injection 4 mg  4 mg Intravenous Q6H PRN Fritzi Mandes, MD      . pantoprazole (PROTONIX) EC tablet 40 mg  40 mg Oral QAC breakfast Fritzi Mandes, MD      . potassium chloride SA (K-DUR,KLOR-CON) CR tablet 20 mEq  20 mEq Oral Daily Fritzi Mandes, MD      . terazosin (HYTRIN) capsule 5 mg  5 mg Oral QPM Fritzi Mandes, MD      . traMADol (ULTRAM) tablet 50 mg  50 mg Oral Q6H PRN Fritzi Mandes, MD         Objective: Vital signs in last 24 hours: Temp:  [98 F (36.7 C)-98.4 F (36.9 C)] 98 F (36.7 C) (05/25 0422) Pulse Rate:  [66-74] 72 (05/25 0422) Resp:  [18-19] 19 (05/25 0422) BP: (136-154)/(74-90) 147/90 mmHg (05/25 0422) SpO2:  [93 %-98 %] 93 % (05/25 0422) Weight:  [215 lb 14.4 oz (97.932 kg)-220 lb (99.791 kg)] 215 lb 14.4 oz (97.932 kg) (05/25 0045)  Intake/Output from previous day: 05/24 0701 - 05/25 0700 In: -  Out: 500 [Urine:500] Intake/Output this shift:      Physical Exam Constitutional: Well nourished. Alert and oriented, No acute distress. HEENT: [name] AT, moist mucus membranes. Trachea midline, no masses. Cardiovascular: No clubbing, cyanosis, or edema. Respiratory: Normal respiratory effort, no increased work of breathing. GI: Abdomen is soft, non tender, non distended, no abdominal masses.  GU: No CVA tenderness.  No bladder fullness or masses.  Patient with uncircumcised phallus.  Foley in place, draining light red tinged urine with small clots Skin: No rashes, bruises or suspicious lesions. Lymph: No cervical or inguinal adenopathy. Neurologic: Grossly intact, no focal deficits, moving all 4 extremities. Psychiatric: Normal mood and affect.  Lab Results:   Recent Labs  12/11/15 2146 12/12/15 0439  WBC 6.1 5.7  HGB 10.1* 10.2*  HCT 30.3* 29.9*  PLT 335 322   BMET  Recent Labs  12/11/15 1651  NA 132*  K 3.8  CL 100*  CO2 22  GLUCOSE 108*  BUN 28*  CREATININE 0.90  CALCIUM 8.6*   PT/INR  Recent Labs  12/11/15 2146  LABPROT 14.8  INR 1.14  ABG No results for input(s): PHART, HCO3 in the last 72 hours.  Invalid input(s): PCO2, PO2  Studies/Results: Ct Renal Stone Study  12/11/2015  CLINICAL DATA:  History of prostate cancer and perform self catheterization. Bleeding on self catheterization today. EXAM: CT ABDOMEN AND PELVIS WITHOUT CONTRAST TECHNIQUE: Multidetector CT imaging of the abdomen and pelvis was performed following the standard protocol without IV contrast. COMPARISON:  CT 06/16/2005 as well as pelvic MRI 01/18/2012 FINDINGS: Lung bases demonstrate minimal scarring/ atelectasis over the lingula and left base. Calcified plaque over the left anterior descending coronary artery. Abdominal images demonstrate a few sub cm liver hypodensity is too small to characterize but likely cysts. The gallbladder, spleen, pancreas and adrenal glands are within normal. Kidneys are normal in size without evidence of  hydronephrosis. Subtle smooth shaped stranding of the perirenal fat. Possible subtle faint cortical calcification upper pole right kidney. There is 6.4 cm simple cyst over the lower pole right kidney. Ureters are within normal. There is calcified plaque over the abdominal aorta iliac arteries. Appendix is normal. Colon is within normal. Small bowel is unremarkable. Pelvic images demonstrate a a focal dependent region of hyperdensity along the posterior bladder wall measuring 1.3 x 2.7 cm in AP and transverse dimension. This likely represents focal hemorrhage, although cannot exclude a mass. Rectum is normal. There is no free fluid or adenopathy. Examination demonstrates chronic displaced fracture of the right superior pubic ramus and bilateral symphysis including junction of inferior pubic rami to the symphysis. Old right inferior pubic ramus fracture. These findings were present on the previous MRI. The There is a nondisplaced fracture across the left superior acetabulum vessels in minimal displaced slightly comminuted fracture of the superior right acetabulum as these likely acute to subacute in nature. There are moderate degenerative changes of the spine with multilevel disc disease over the lumbar spine. Moderate degenerative change of the hips. IMPRESSION: 1.3 x 2.7 cm hyperdense collection along the posterior dependent portion of the bladder likely a focal region of hemorrhage, although cannot exclude a mass. No adenopathy. No renal/ureteral stones or obstruction. 6.4 cm right renal cyst. Few small subcentimeter liver hypodensities likely cysts or hemangiomas. Displaced somewhat comminuted chronic fracture involving the right superior ramus and bilateral symphysis including the junction of the inferior pubic rami bilaterally. Acute to subacute fracture left superior acetabulum without significant displacement as well as right acetabulum fracture with mild displacement and comminution. Atherosclerotic coronary  artery disease. Electronically Signed   By: Marin Olp M.D.   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 History of urethral stricture but 46 French coud catheter was able to be placed.  Plan:  Bladder Irrigation Due to clots patient is present today for a bladder irrigation. Patient was cleaned and prepped in a sterile fashion. 700 ml of saline/sterile water was instilled and irrigated into the bladder with a 27ml [place] syringe through the catheter in place.  After irrigation urine flow was noted, no complications were noted catheter is now draining fine.  Catheter was reattached to the overnight bag for drainage. Patient tolerated well.  Continue Foley catheter. If urine remains clear, foley could be discontinued for void trial. He'll need outpatient cystoscopy. Urology to follow.     LOS: 1 day    [facility] 12/12/2015

## 2015-12-12 NOTE — Care Management Note (Signed)
Case Management Note  Patient Details  Name: Geoffrey Barber MRN: [id number] Date of Birth: Jun 03, 1925  Subjective/Objective:                   Met with patient to discuss discharge planning. He lives with his wife at [facility] apartment/[place]. He would like to go to [facility] unit for rehab however he just walked to the nurses station with PT. He is affiliated with [facility] and they are aware that patient is here and wishes to stay at [facility] for treatment. He receives his Rx from [employer] order. He states his PCP is Dr. Loney Hering. He uses a walker to ambulate. Action/Plan: List of home health agencies left with patient. CSW updated. I have faxed notes to [facility].  RNCM will continue to follow.   Expected Discharge Date:                  Expected Discharge Plan:     In-House Referral:     Discharge planning Services  CM Consult  Post Acute Care Choice:  Home Health Choice offered to:  Patient  DME Arranged:    DME Agency:     HH Arranged:    [employer] Agency:     Status of Service:  In process, will continue to follow  Medicare Important Message Given:    Date Medicare IM Given:    Medicare IM give by:    Date Additional Medicare IM Given:    Additional Medicare Important Message give by:     If discussed at [place] of Stay Meetings, dates discussed:    Additional Comments:  Marshell Garfinkel, RN 12/12/2015, 11:35 AM

## 2015-12-12 NOTE — NC FL2 (Signed)
[place] LEVEL OF CARE SCREENING TOOL     IDENTIFICATION  Patient Name: QUENTIN GERRIOR Birthdate: 1925-06-14 Sex: male Admission Date (Current Location): 12/11/2015  [place] and [place] Number:  Engineering geologist and Address:  [facility], [address] [place], [place] [address]      Provider Number: [id number]  Attending Physician Name and Address:  Hillary Bow, MD  Relative Name and Phone Number:       Current Level of Care: Hospital Recommended Level of Care: [place] Prior Approval Number:    Date Approved/Denied:   PASRR Number:  ( [id number] )  Discharge Plan: SNF    Current Diagnoses: Patient Active Problem List   Diagnosis Date Noted  . Hematuria 12/11/2015    Orientation RESPIRATION BLADDER Height & Weight     Self, Time, Situation, Place  Normal Continent (Blood in Urine) Weight: 215 lb 14.4 oz (97.932 kg) Height:  6' (182.9 cm)  BEHAVIORAL SYMPTOMS/MOOD NEUROLOGICAL BOWEL NUTRITION STATUS   (none )  (none ) Continent Diet (Regular Diet)  AMBULATORY STATUS COMMUNICATION OF NEEDS Skin   Extensive Assist Verbally Normal                       Personal Care Assistance Level of Assistance  Bathing, Feeding, Dressing Bathing Assistance: Limited assistance Feeding assistance: Independent Dressing Assistance: Limited assistance     Functional Limitations Info  Sight, Hearing, Speech Sight Info: Impaired Hearing Info: Impaired Speech Info: Adequate    SPECIAL CARE FACTORS FREQUENCY  PT (By licensed PT), OT (By licensed OT)     PT Frequency:  (5) OT Frequency:  (5)            Contractures      Additional Factors Info  Code Status, Allergies Code Status Info:  (Full Code. ) Allergies Info:  (No Known Allergies )           Current Medications (12/12/2015):  This is the current hospital active medication list Current Facility-Administered Medications  Medication Dose  Route Frequency Provider Last Rate Last Dose  . acetaminophen (TYLENOL) tablet 650 mg  650 mg Oral Q6H PRN Srikar Sudini, MD      . amLODipine (NORVASC) tablet 10 mg  10 mg Oral Daily Fritzi Mandes, MD   10 mg at 12/12/15 0908  . cholecalciferol (VITAMIN D) tablet 1,000 Units  1,000 Units Oral Daily Fritzi Mandes, MD   1,000 Units at 12/12/15 0908  . dapsone tablet 25 mg  25 mg Oral Cherylynn Ridges, MD   25 mg at 12/12/15 0908  . hydrALAZINE (APRESOLINE) injection 10 mg  10 mg Intravenous Q6H PRN Fritzi Mandes, MD      . hydrochlorothiazide (HYDRODIURIL) tablet 25 mg  25 mg Oral Daily Fritzi Mandes, MD   25 mg at 12/12/15 0908  . ondansetron (ZOFRAN) tablet 4 mg  4 mg Oral Q6H PRN Fritzi Mandes, MD       Or  . ondansetron (ZOFRAN) injection 4 mg  4 mg Intravenous Q6H PRN Fritzi Mandes, MD      . pantoprazole (PROTONIX) EC tablet 40 mg  40 mg Oral QAC breakfast Fritzi Mandes, MD   40 mg at 12/12/15 0908  . potassium chloride SA (K-DUR,KLOR-CON) CR tablet 20 mEq  20 mEq Oral Daily Fritzi Mandes, MD   20 mEq at 12/12/15 0908  . terazosin (HYTRIN) capsule 5 mg  5 mg Oral QPM Fritzi Mandes, MD      .  traMADol (ULTRAM) tablet 50 mg  50 mg Oral Q6H PRN Fritzi Mandes, MD         Discharge Medications: Please see discharge summary for a list of discharge medications.  Relevant Imaging Results:  Relevant Lab Results:   Additional Information  (SSN: [id number])  Loralyn Freshwater, LCSW

## 2015-12-12 NOTE — Clinical Social Work Placement (Signed)
   CLINICAL SOCIAL WORK PLACEMENT  NOTE  Date:  12/12/2015  Patient Details  Name: Geoffrey Barber MRN: [id number] Date of Birth: 08-Mar-1925  Clinical Social Work is seeking post-discharge placement for this patient at the [place] level of care (*CSW will initial, date and re-position this form in  chart as items are completed):  Yes   Patient/family provided with [place] Work Department's list of facilities offering this level of care within the geographic area requested by the patient (or if unable, by the patient's family).  Yes   Patient/family informed of their freedom to choose among providers that offer the needed level of care, that participate in Medicare, Medicaid or managed care program needed by the patient, have an available bed and are willing to accept the patient.  Yes   Patient/family informed of [place]'s ownership interest in [facility] and [facility], as well as of the fact that they are under no obligation to receive care at these facilities.  PASRR submitted to EDS on       PASRR number received on       Existing PASRR number confirmed on 12/12/15     FL2 transmitted to all facilities in geographic area requested by pt/family on 12/12/15     FL2 transmitted to all facilities within larger geographic area on       Patient informed that his/her managed care company has contracts with or will negotiate with certain facilities, including the following:        Yes   Patient/family informed of bed offers received.  Patient chooses bed at  [facility]) )     Physician recommends and patient chooses bed at      Patient to be transferred to   on  .  Patient to be transferred to facility by       Patient family notified on   of transfer.  Name of family member notified:        PHYSICIAN       Additional Comment:    _______________________________________________ Loralyn Freshwater,  LCSW 12/12/2015, 11:17 AM

## 2015-12-12 NOTE — Progress Notes (Signed)
Foley irrigated, many blood clots came out. Foley draining now. Will irrigate again in an hour.

## 2015-12-12 NOTE — Progress Notes (Signed)
Physical Therapy Evaluation Patient Details Name: Geoffrey Barber MRN: [id number] DOB: [birth year] Today's Date: 12/12/2015   History of Present Illness  Geoffrey Barber is a 82 y.o. male with a known history of Prostate cancer, hypertension, arthritis comes to the emergency room after he started noticing hematuria since last time.   Clinical Impression  Pt presents to PT with decreased functional mobility and weakness and would benefit from acute PT services to address objective findings.  Pt uses a  RW at baseline and walks with a slow gait and decreased toe clearance on L foot putting pt at risk of falls.  Pt requires Min A for bed mobility and transfers.  Pt also reports he has trouble getting in and out of his car prior to admission.  Recommend f/u services at SNF level of care in order for pt to return safely to independent living apartment with wife.    Follow Up Recommendations SNF    Equipment Recommendations  Rolling walker with 5" wheels (has own RW)    Recommendations for Other Services       Precautions / Restrictions Precautions Precautions: Fall Precaution Comments: Mod Restrictions Weight Bearing Restrictions: No      Mobility  Bed Mobility Overal bed mobility: Needs Assistance Bed Mobility: Supine to Sit;Sit to Supine     Supine to sit: Min guard Sit to supine: Min assist   General bed mobility comments: Exiting to R side, uses R rail to roll, elevated trunk using R UE, slow to move legs to EOB, increased time.  Transfers Overall transfer level: Needs assistance Equipment used: Rolling walker (2 wheeled) Transfers: Sit to/from Stand Sit to Stand: Min assist         General transfer comment: Decreased anterior weight shift and slow lift off from bed using B UE's on seated surface, Min A for balance during hand transfer to RW.  Ambulation/Gait   Ambulation Distance (Feet): 40 Feet Assistive device: Rolling walker (2 wheeled) Gait Pattern/deviations:  Step-through pattern;Decreased step length - left;Steppage;Narrow base of support Gait velocity: very slow Gait velocity interpretation: Below normal speed for age/gender General Gait Details: Slow gait with L foot drop, toes dragging on floor and heavy lean on RW with forward flexed posture; decreased balance/safety with turns (pt turns walker too far in front of body before moving feet.)  Stairs            Wheelchair Mobility    Modified Rankin (Stroke Patients Only)       Balance Overall balance assessment: Needs assistance Sitting-balance support: Bilateral upper extremity supported;Feet supported Sitting balance-Leahy Scale: Good   Postural control: Posterior lean Standing balance support: Bilateral upper extremity supported;During functional activity Standing balance-Leahy Scale: Fair                               Pertinent Vitals/Pain Pain Assessment: No/denies pain    Home Living Family/patient expects to be discharged to:: Private residence Living Arrangements: Spouse/significant other   Type of Home: Apartment Home Access: Elevator     Home Layout: One level Home Equipment: Environmental consultant - 2 wheels;Bedside commode;Cane - quad;Cane - single point;Grab bars - toilet;Walker - 4 wheels      Prior Function Level of Independence: Independent with assistive device(s)         Comments: wife helps with buttons and shoes     Hand Dominance        Extremity/Trunk Assessment   Upper  Extremity Assessment: Generalized weakness;LUE deficits/detail       LUE Deficits / Details: L UE weakness from history of CVA   Lower Extremity Assessment: Generalized weakness;LLE deficits/detail   LLE Deficits / Details: L foot drop with ambulation      Communication   Communication: No difficulties  Cognition Arousal/Alertness: Awake/alert Behavior During Therapy: WFL for tasks assessed/performed Overall Cognitive Status: Within Functional Limits for tasks  assessed                      General Comments General comments (skin integrity, edema, etc.): foley with pink drainage    Exercises        Assessment/Plan    PT Assessment Patient needs continued PT services  PT Diagnosis Difficulty walking;Generalized weakness;Hemiplegia non-dominant side   PT Problem List Decreased strength;Decreased range of motion;Decreased activity tolerance;Decreased balance;Decreased mobility;Decreased safety awareness  PT Treatment Interventions Gait training;Functional mobility training;Therapeutic activities;Therapeutic exercise;Balance training;Patient/family education   PT Goals (Current goals can be found in the Care Plan section) Acute Rehab PT Goals Patient Stated Goal: To get stronger. PT Goal Formulation: With patient/family Time For Goal Achievement: 12/19/15 Potential to Achieve Goals: Good    Frequency Min 2X/week   Barriers to discharge        Co-evaluation               End of Session Equipment Utilized During Treatment: Gait belt Activity Tolerance: Patient tolerated treatment well Patient left: in bed;with call bell/phone within reach;with nursing/sitter in room;with family/visitor present Nurse Communication: Mobility status         Time: 1028-1110 PT Time Calculation (min) (ACUTE ONLY): 42 min   Charges:   PT Evaluation $PT Eval Moderate Complexity: 1 Procedure PT Treatments $Gait Training: 8-22 mins   PT G Codes:        Deray Dawes A Teonna Coonan, PT 12/12/2015, 11:14 AM

## 2015-12-12 NOTE — Clinical Social Work Note (Signed)
Clinical Social Work Assessment  Patient Details  Name: Geoffrey Barber MRN: [id number] Date of Birth: Jul 02, 1925  Date of referral:  12/12/15               Reason for consult:  Facility Placement, Other (Comment Required) (From Independent Living at [place]. )                Permission sought to share information with:  Chartered certified accountant granted to share information::  Yes, Verbal Permission Granted  Name::      Geophysicist/field seismologist::   [place]   Relationship::     Contact Information:     Housing/Transportation Living arrangements for the past 2 months:  Charity fundraiser of Information:  Patient, Spouse Patient Interpreter Needed:  None Criminal Activity/Legal Involvement Pertinent to Current Situation/Hospitalization:  No - Comment as needed Significant Relationships:  Spouse Lives with:  Spouse Do you feel safe going back to the place where you live?  Yes Need for family participation in patient care:  Yes (Comment)  Care giving concerns:  Patient and his wife live at the independent living section at [place].    Social Worker assessment / plan:  Holiday representative ([place]) received verbal consult from RN Case Freight forwarder that patient wants to go to the [facility] at [place]. PT is recommending SNF. CSW met with patient and his wife Geoffrey Barber was at bedside. CSW introduced self and explained role of CSW department. Patient was alert and oriented. CSW explained that patient will need a 3 night qualifying inpatient stay at the hospital in order for Medicare to cover SNF. Patient and wife requested for patient to go to [place]. Per Kim admissions coordinator at [facility] patient can [facility] the [facility]. Plan is for patient to D/C to [facility] on Saturday 12/14/15 pending medical clearance. CSW will continue to follow and assist as needed.   Employment status:  Retired Forensic scientist:   Medicare PT Recommendations:  [place] / Referral to community resources:  [place]  Patient/Family's Response to care:  Patient and wife are agreeable for patient to go to [facility]   Patient/Family's Understanding of and Emotional Response to Diagnosis, Current Treatment, and Prognosis:  Patient and wife were pleasant and thanked CSW for visit.   Emotional Assessment Appearance:  Appears stated age Attitude/Demeanor/Rapport:    Affect (typically observed):  Accepting, Adaptable, Pleasant Orientation:  Oriented to Self, Oriented to Place, Oriented to  Time, Oriented to Situation Alcohol / Substance use:  Not Applicable Psych involvement (Current and /or in the community):  No (Comment)  Discharge Needs  Concerns to be addressed:  Discharge Planning Concerns Readmission within the last 30 days:  No Current discharge risk:  Dependent with Mobility Barriers to Discharge:  Continued Medical Work up   Loralyn Freshwater, LCSW 12/12/2015, 11:19 AM

## 2015-12-13 LAB — HEMOGLOBIN: Hemoglobin: 9.6 g/dL — ABNORMAL LOW (ref 13.0–18.0)

## 2015-12-13 MED ORDER — TRAMADOL HCL 50 MG PO TABS: 50.0000 mg | ORAL_TABLET | Freq: Four times a day (QID) | ORAL | Status: DC | PRN

## 2015-12-13 NOTE — Progress Notes (Signed)
Patient ID: Geoffrey Barber, male   DOB: 1924/12/08, 82 y.o.   MRN: [id number] [facility] PROGRESS NOTE  Geoffrey Barber [id number] DOB: 07/01/1925 DOA: 12/11/2015 PCP: Marcello Fennel, MD  HPI/Subjective: Patient feeling okay. Still has blood coming out of the Foley catheter. But better than yesterday. Yesterday he had a be flushed quite a bit secondary to clots.  Objective: Filed Vitals:   12/13/15 0859 12/13/15 1542  BP: 111/61 121/65  Pulse: 76 70  Temp: 98.8 F (37.1 C) 98.5 F (36.9 C)  Resp:  20    Filed Weights   12/11/15 1641 12/12/15 0045  Weight: 99.791 kg (220 lb) 97.932 kg (215 lb 14.4 oz)    ROS: Review of Systems  Constitutional: Negative for fever and chills.  Eyes: Negative for blurred vision.  Respiratory: Negative for cough and shortness of breath.   Cardiovascular: Negative for chest pain.  Gastrointestinal: Negative for nausea, vomiting, abdominal pain, diarrhea and constipation.  Genitourinary: Positive for hematuria. Negative for dysuria.  Musculoskeletal: Negative for joint pain.  Neurological: Negative for dizziness and headaches.   Exam: Physical Exam  Constitutional: He is oriented to person, place, and time.  HENT:  Nose: No mucosal edema.  Mouth/Throat: No oropharyngeal exudate or posterior oropharyngeal edema.  Eyes: Conjunctivae, EOM and lids are normal. Pupils are equal, round, and reactive to light.  Neck: No JVD present. Carotid bruit is not present. No edema present. No thyroid mass and no thyromegaly present.  Cardiovascular: S1 normal and S2 normal.  Exam reveals no gallop.   No murmur heard. Pulses:      Dorsalis pedis pulses are 2+ on the right side, and 2+ on the left side.  Respiratory: No respiratory distress. He has no wheezes. He has no rhonchi. He has no rales.  GI: Soft. Bowel sounds are normal. There is no tenderness.  Musculoskeletal:       Right ankle: He exhibits no swelling.       Left ankle: He exhibits no  swelling.  Lymphadenopathy:    He has no cervical adenopathy.  Neurological: He is alert and oriented to person, place, and time. No cranial nerve deficit.  Skin: Skin is warm. No rash noted. Nails show no clubbing.  Psychiatric: He has a normal mood and affect.      Data Reviewed: Basic Metabolic Panel:  Recent Labs Lab 12/11/15 1651  NA 132*  K 3.8  CL 100*  CO2 22  GLUCOSE 108*  BUN 28*  CREATININE 0.90  CALCIUM 8.6*   CBC:  Recent Labs Lab 12/11/15 1651 12/11/15 2146 12/12/15 0439 12/13/15 0542  WBC 6.2 6.1 5.7  --   NEUTROABS  --  4.2  --   --   HGB 10.2* 10.1* 10.2* 9.6*  HCT 30.5* 30.3* 29.9*  --   MCV 86.5 86.2 84.3  --   PLT 350 335 322  --      Studies: Ct Renal Stone Study  12/11/2015  CLINICAL DATA:  History of prostate cancer and perform self catheterization. Bleeding on self catheterization today. EXAM: CT ABDOMEN AND PELVIS WITHOUT CONTRAST TECHNIQUE: Multidetector CT imaging of the abdomen and pelvis was performed following the standard protocol without IV contrast. COMPARISON:  CT 06/16/2005 as well as pelvic MRI 01/18/2012 FINDINGS: Lung bases demonstrate minimal scarring/ atelectasis over the lingula and left base. Calcified plaque over the left anterior descending coronary artery. Abdominal images demonstrate a few sub cm liver hypodensity is too small to characterize but likely  cysts. The gallbladder, spleen, pancreas and adrenal glands are within normal. Kidneys are normal in size without evidence of hydronephrosis. Subtle smooth shaped stranding of the perirenal fat. Possible subtle faint cortical calcification upper pole right kidney. There is 6.4 cm simple cyst over the lower pole right kidney. Ureters are within normal. There is calcified plaque over the abdominal aorta iliac arteries. Appendix is normal. Colon is within normal. Small bowel is unremarkable. Pelvic images demonstrate a a focal dependent region of hyperdensity along the posterior  bladder wall measuring 1.3 x 2.7 cm in AP and transverse dimension. This likely represents focal hemorrhage, although cannot exclude a mass. Rectum is normal. There is no free fluid or adenopathy. Examination demonstrates chronic displaced fracture of the right superior pubic ramus and bilateral symphysis including junction of inferior pubic rami to the symphysis. Old right inferior pubic ramus fracture. These findings were present on the previous MRI. The There is a nondisplaced fracture across the left superior acetabulum vessels in minimal displaced slightly comminuted fracture of the superior right acetabulum as these likely acute to subacute in nature. There are moderate degenerative changes of the spine with multilevel disc disease over the lumbar spine. Moderate degenerative change of the hips. IMPRESSION: 1.3 x 2.7 cm hyperdense collection along the posterior dependent portion of the bladder likely a focal region of hemorrhage, although cannot exclude a mass. No adenopathy. No renal/ureteral stones or obstruction. 6.4 cm right renal cyst. Few small subcentimeter liver hypodensities likely cysts or hemangiomas. Displaced somewhat comminuted chronic fracture involving the right superior ramus and bilateral symphysis including the junction of the inferior pubic rami bilaterally. Acute to subacute fracture left superior acetabulum without significant displacement as well as right acetabulum fracture with mild displacement and comminution. Atherosclerotic coronary artery disease. Electronically Signed   By: Marin Olp M.D.   On: 12/11/2015 20:28    Scheduled Meds: . amLODipine  10 mg Oral Daily  . cholecalciferol  1,000 Units Oral Daily  . dapsone  25 mg Oral QODAY  . hydrochlorothiazide  25 mg Oral Daily  . pantoprazole  40 mg Oral QAC breakfast  . potassium chloride SA  20 mEq Oral Daily  . PRESERVISION AREDS 2  1 capsule Oral BID  . terazosin  5 mg Oral QPM    Assessment/Plan:  1. Hematuria,  abnormal CTscan showing  Debris in the bladder which could be a mass or focal hemorrhage.  Plavix on hold.  Patient seen by urology and theyrecommended discontinuing the Foley catheter when the urine is clear. Since the urine is not clear today I will reassess this tomorrow. A  Voiding trial will happen after catheter removed.his is likely a radiation cystitis 2. Essential hypertension on usual medications 3. History of prostate cancer status post radiation 4. Anemia likely from chronic blood loss 5. Gastroesophageal reflux disease on PPI  Code Status:     Code Status Orders        Start     Ordered   12/12/15 0042  Full code   Continuous     12/12/15 0041    Code Status History    Date Active Date Inactive Code Status Order ID Comments User Context   This patient has a current code status but no historical code status.     Disposition Plan: likely to rehabilitation over the weekend  Consultants:  urology  Time spent: 25 minutes  Loletha Grayer  [employer]

## 2015-12-13 NOTE — Discharge Instructions (Signed)

## 2015-12-13 NOTE — Discharge Summary (Addendum)
[facility] [place] NAME: Geoffrey Barber    MR#:  [id number]  DATE OF BIRTH:  1924-12-03  DATE OF ADMISSION:  12/11/2015 ADMITTING PHYSICIAN: Fritzi Mandes, MD  DATE OF DISCHARGE: 12/14/2015  PRIMARY CARE PHYSICIAN: BABAOFF, Caryl Bis, MD    ADMISSION DIAGNOSIS:  Hematuria [R31.9]  DISCHARGE DIAGNOSIS:  Active Problems:   Hematuria   SECONDARY DIAGNOSIS:   Past Medical History  Diagnosis Date  . Cancer ([place])   . Hypertension   . Arthritis     HOSPITAL COURSE:   1. Gross hematuria with abnormal CT scan showing debris in the bladder which could be a mass or focal hemorrhage. Plavix was stopped. Patient was seen in consultation by urology. Remove foley and voiding trial today. Patient states he does intermittent catheterizations. Patient will need cystoscopy as outpatient. Follow-up hemoglobin weekly as outpatient. Likely this is a radiation cystitis.  2. Essential hypertension continue usual medications 3. History of prostate cancer 4. Gastroesophageal reflux disease on PPI 5. Anemia likely from blood loss  DISCHARGE CONDITIONS:   Satisfactory  CONSULTS OBTAINED:  Treatment Team:  Festus Aloe, MD  DRUG ALLERGIES:  No Known Allergies  DISCHARGE MEDICATIONS:   Current Discharge Medication List    CONTINUE these medications which have CHANGED   Details  traMADol (ULTRAM) 50 MG tablet Take 1 tablet (50 mg total) by mouth every 6 (six) hours as needed. Qty: 30 tablet, Refills: 0      CONTINUE these medications which have NOT CHANGED   Details  acetaminophen (TYLENOL) 325 MG tablet Take 650 mg by mouth every 6 (six) hours as needed for mild pain.    amLODipine (NORVASC) 10 MG tablet Take 10 mg by mouth daily.    Cholecalciferol (VITAMIN D3) 1000 units CAPS Take 1 capsule by mouth daily.    dapsone 25 MG tablet Take 25 mg by mouth every other day.    hydrochlorothiazide (HYDRODIURIL) 25 MG tablet Take 25 mg by mouth  daily.    lisinopril (PRINIVIL,ZESTRIL) 20 MG tablet Take 20 mg by mouth daily.    Multiple Vitamins-Minerals (MULTIVITAMIN WITH MINERALS) tablet Take 1 tablet by mouth daily.    omeprazole (PRILOSEC) 20 MG capsule Take 20 mg by mouth daily.    potassium chloride SA (K-DUR,KLOR-CON) 20 MEQ tablet Take 20 mEq by mouth daily.    psyllium (METAMUCIL) 58.6 % powder Take 1 packet by mouth daily.    terazosin (HYTRIN) 5 MG capsule Take 5 mg by mouth every evening.      STOP taking these medications     clopidogrel (PLAVIX) 75 MG tablet      meloxicam (MOBIC) 15 MG tablet          DISCHARGE INSTRUCTIONS:    Follow-up with Dr. at rehabilitation one day  If you experience worsening of your admission symptoms, develop shortness of breath, life threatening emergency, suicidal or homicidal thoughts you must seek medical attention immediately by calling 911 or calling your MD immediately  if symptoms less severe.  You Must read complete instructions/literature along with all the possible adverse reactions/side effects for all the Medicines you take and that have been prescribed to you. Take any new Medicines after you have completely understood and accept all the possible adverse reactions/side effects.   Please note  You were cared for by a hospitalist during your hospital stay. If you have any questions about your discharge medications or the care you received while you were in the  hospital after you are discharged, you can call the unit and asked to speak with the hospitalist on call if the hospitalist that took care of you is not available. Once you are discharged, your primary care physician will handle any further medical issues. Please note that NO REFILLS for any discharge medications will be authorized once you are discharged, as it is imperative that you return to your primary care physician (or establish a relationship with a primary care physician if you do not have one) for your  aftercare needs so that they can reassess your need for medications and monitor your lab values.    Today   CHIEF COMPLAINT:   Chief Complaint  Patient presents with  . Hematuria    HISTORY OF PRESENT ILLNESS:  Geoffrey Barber  is a 82 y.o. male presented with hematuria   VITAL SIGNS:  Blood pressure 111/61, pulse 76, temperature 98.8 F (37.1 C), temperature source Oral, resp. rate 16, height 6' (1.829 m), weight 97.932 kg (215 lb 14.4 oz), SpO2 94 %.    PHYSICAL EXAMINATION:  GENERAL:  82 y.o.-year-old patient lying in the bed with no acute distress.  EYES: Pupils equal, round, reactive to light and accommodation. No scleral icterus. Extraocular muscles intact.  HEENT: Head atraumatic, normocephalic. Oropharynx and nasopharynx clear.  NECK:  Supple, no jugular venous distention. No thyroid enlargement, no tenderness.  LUNGS: Normal breath sounds bilaterally, no wheezing, rales,rhonchi or crepitation. No use of accessory muscles of respiration.  CARDIOVASCULAR: S1, S2 normal. No murmurs, rubs, or gallops.  ABDOMEN: Soft, non-tender, non-distended. Bowel sounds present. No organomegaly or mass.  EXTREMITIES:  Trace edema, no cyanosis, or clubbing.  NEUROLOGIC: Cranial nerves II through XII are intact. Muscle strength 5/5 in all extremities. Sensation intact. Gait not checked.  PSYCHIATRIC: The patient is alert and oriented x 3.  SKIN: No obvious rash, lesion, or ulcer.   DATA REVIEW:   CBC  Recent Labs Lab 12/12/15 0439 12/13/15 0542  WBC 5.7  --   HGB 10.2* 9.6*  HCT 29.9*  --   PLT 322  --     Chemistries   Recent Labs Lab 12/11/15 1651  NA 132*  K 3.8  CL 100*  CO2 22  GLUCOSE 108*  BUN 28*  CREATININE 0.90  CALCIUM 8.6*     RADIOLOGY:  Ct Renal Stone Study  12/11/2015  CLINICAL DATA:  History of prostate cancer and perform self catheterization. Bleeding on self catheterization today. EXAM: CT ABDOMEN AND PELVIS WITHOUT CONTRAST TECHNIQUE:  Multidetector CT imaging of the abdomen and pelvis was performed following the standard protocol without IV contrast. COMPARISON:  CT 06/16/2005 as well as pelvic MRI 01/18/2012 FINDINGS: Lung bases demonstrate minimal scarring/ atelectasis over the lingula and left base. Calcified plaque over the left anterior descending coronary artery. Abdominal images demonstrate a few sub cm liver hypodensity is too small to characterize but likely cysts. The gallbladder, spleen, pancreas and adrenal glands are within normal. Kidneys are normal in size without evidence of hydronephrosis. Subtle smooth shaped stranding of the perirenal fat. Possible subtle faint cortical calcification upper pole right kidney. There is 6.4 cm simple cyst over the lower pole right kidney. Ureters are within normal. There is calcified plaque over the abdominal aorta iliac arteries. Appendix is normal. Colon is within normal. Small bowel is unremarkable. Pelvic images demonstrate a a focal dependent region of hyperdensity along the posterior bladder wall measuring 1.3 x 2.7 cm in AP and transverse dimension. This likely represents  focal hemorrhage, although cannot exclude a mass. Rectum is normal. There is no free fluid or adenopathy. Examination demonstrates chronic displaced fracture of the right superior pubic ramus and bilateral symphysis including junction of inferior pubic rami to the symphysis. Old right inferior pubic ramus fracture. These findings were present on the previous MRI. The There is a nondisplaced fracture across the left superior acetabulum vessels in minimal displaced slightly comminuted fracture of the superior right acetabulum as these likely acute to subacute in nature. There are moderate degenerative changes of the spine with multilevel disc disease over the lumbar spine. Moderate degenerative change of the hips. IMPRESSION: 1.3 x 2.7 cm hyperdense collection along the posterior dependent portion of the bladder likely a  focal region of hemorrhage, although cannot exclude a mass. No adenopathy. No renal/ureteral stones or obstruction. 6.4 cm right renal cyst. Few small subcentimeter liver hypodensities likely cysts or hemangiomas. Displaced somewhat comminuted chronic fracture involving the right superior ramus and bilateral symphysis including the junction of the inferior pubic rami bilaterally. Acute to subacute fracture left superior acetabulum without significant displacement as well as right acetabulum fracture with mild displacement and comminution. Atherosclerotic coronary artery disease. Electronically Signed   By: Marin Olp M.D.   On: 12/11/2015 20:28    Management plans discussed with the patient, family and they are in agreement.  CODE STATUS:     Code Status Orders        Start     Ordered   12/12/15 0042  Full code   Continuous     12/12/15 0041    Code Status History    Date Active Date Inactive Code Status Order ID Comments User Context   This patient has a current code status but no historical code status.      TOTAL TIME TAKING CARE OF THIS PATIENT: 35 minutes.    Loletha Grayer M.D on 12/13/2015 at 1:41 PM  Between 7am to 6pm - Pager - [phone]  After 6pm go to [email or web address] - password [email or web address]  [facility] Office  [phone]  CC: Primary care physician; BABAOFF, Caryl Bis, MD

## 2015-12-13 NOTE — Progress Notes (Signed)
Urology Consult Follow Up  Subjective: No complaints.  Foley remains in place.  Light pink without clots.   Hgb stable.    Anti-infectives: Anti-infectives    Start     Dose/Rate Route Frequency Ordered Stop   12/12/15 1000  dapsone tablet 25 mg     25 mg Oral Every other day 12/12/15 0041        Current Facility-Administered Medications  Medication Dose Route Frequency Provider Last Rate Last Dose  . acetaminophen (TYLENOL) tablet 650 mg  650 mg Oral Q6H PRN Hillary Bow, MD   650 mg at 12/13/15 0951  . amLODipine (NORVASC) tablet 10 mg  10 mg Oral Daily Fritzi Mandes, MD   10 mg at 12/13/15 0952  . cholecalciferol (VITAMIN D) tablet 1,000 Units  1,000 Units Oral Daily Fritzi Mandes, MD   1,000 Units at 12/13/15 [phone]  . dapsone tablet 25 mg  25 mg Oral Cherylynn Ridges, MD   25 mg at 12/12/15 0908  . hydrALAZINE (APRESOLINE) injection 10 mg  10 mg Intravenous Q6H PRN Fritzi Mandes, MD      . hydrochlorothiazide (HYDRODIURIL) tablet 25 mg  25 mg Oral Daily Fritzi Mandes, MD   25 mg at 12/13/15 0952  . ondansetron (ZOFRAN) tablet 4 mg  4 mg Oral Q6H PRN Fritzi Mandes, MD       Or  . ondansetron (ZOFRAN) injection 4 mg  4 mg Intravenous Q6H PRN Fritzi Mandes, MD      . pantoprazole (PROTONIX) EC tablet 40 mg  40 mg Oral QAC breakfast Fritzi Mandes, MD   40 mg at 12/13/15 0952  . potassium chloride SA (K-DUR,KLOR-CON) CR tablet 20 mEq  20 mEq Oral Daily Fritzi Mandes, MD   20 mEq at 12/13/15 0952  . PRESERVISION AREDS 2 CAPS 1 capsule  1 capsule Oral BID Hillary Bow, MD   1 capsule at 12/13/15 0953  . terazosin (HYTRIN) capsule 5 mg  5 mg Oral QPM Fritzi Mandes, MD   5 mg at 12/12/15 2258  . traMADol (ULTRAM) tablet 50 mg  50 mg Oral Q6H PRN Fritzi Mandes, MD         Objective: Vital signs in last 24 hours: Temp:  [97.6 F (36.4 C)-98.8 F (37.1 C)] 98.5 F (36.9 C) (05/26 1542) Pulse Rate:  [70-78] 70 (05/26 1542) Resp:  [16-22] 20 (05/26 1542) BP: (111-121)/(61-65) 121/65 mmHg (05/26 1542) SpO2:  [94  %-98 %] 95 % (05/26 1542)  Intake/Output from previous day: 05/25 0701 - 05/26 0700 In: 240 [P.O.:240] Out: 1975 [Urine:1975] Intake/Output this shift: Total I/O In: 480 [P.O.:480] Out: 550 [Urine:550]   Physical Exam Constitutional: Well nourished. Alert and oriented, No acute distress.  Eating dinnner.    HEENT: [place] AT, moist mucus membranes. Trachea midline, no masses. Cardiovascular: No clubbing, cyanosis, or edema. Respiratory: Normal respiratory effort, no increased work of breathing. GI: Abdomen is soft, non tender, non distended, no abdominal masses.  GU: No CVA tenderness.  No bladder fullness or masses.  Patient with uncircumcised phallus.  Foley in place, light pink  Urine with no clots.   Neurologic: Grossly intact, no focal deficits, moving all 4 extremities. Psychiatric: Normal mood and affect.  Lab Results:   Recent Labs  12/11/15 2146 12/12/15 0439 12/13/15 0542  WBC 6.1 5.7  --   HGB 10.1* 10.2* 9.6*  HCT 30.3* 29.9*  --   PLT 335 322  --    BMET  Recent Labs  12/11/15 1651  NA 132*  K 3.8  CL 100*  CO2 22  GLUCOSE 108*  BUN 28*  CREATININE 0.90  CALCIUM 8.6*   PT/INR  Recent Labs  12/11/15 2146  LABPROT 14.8  INR 1.14   ABG No results for input(s): PHART, HCO3 in the last 72 hours.  Invalid input(s): PCO2, PO2  Studies/Results: Ct Renal Stone Study  12/11/2015  CLINICAL DATA:  History of prostate cancer and perform self catheterization. Bleeding on self catheterization today. EXAM: CT ABDOMEN AND PELVIS WITHOUT CONTRAST TECHNIQUE: Multidetector CT imaging of the abdomen and pelvis was performed following the standard protocol without IV contrast. COMPARISON:  CT 06/16/2005 as well as pelvic MRI 01/18/2012 FINDINGS: Lung bases demonstrate minimal scarring/ atelectasis over the lingula and left base. Calcified plaque over the left anterior descending coronary artery. Abdominal images demonstrate a few sub cm liver hypodensity is too  small to characterize but likely cysts. The gallbladder, spleen, pancreas and adrenal glands are within normal. Kidneys are normal in size without evidence of hydronephrosis. Subtle smooth shaped stranding of the perirenal fat. Possible subtle faint cortical calcification upper pole right kidney. There is 6.4 cm simple cyst over the lower pole right kidney. Ureters are within normal. There is calcified plaque over the abdominal aorta iliac arteries. Appendix is normal. Colon is within normal. Small bowel is unremarkable. Pelvic images demonstrate a a focal dependent region of hyperdensity along the posterior bladder wall measuring 1.3 x 2.7 cm in AP and transverse dimension. This likely represents focal hemorrhage, although cannot exclude a mass. Rectum is normal. There is no free fluid or adenopathy. Examination demonstrates chronic displaced fracture of the right superior pubic ramus and bilateral symphysis including junction of inferior pubic rami to the symphysis. Old right inferior pubic ramus fracture. These findings were present on the previous MRI. The There is a nondisplaced fracture across the left superior acetabulum vessels in minimal displaced slightly comminuted fracture of the superior right acetabulum as these likely acute to subacute in nature. There are moderate degenerative changes of the spine with multilevel disc disease over the lumbar spine. Moderate degenerative change of the hips. IMPRESSION: 1.3 x 2.7 cm hyperdense collection along the posterior dependent portion of the bladder likely a focal region of hemorrhage, although cannot exclude a mass. No adenopathy. No renal/ureteral stones or obstruction. 6.4 cm right renal cyst. Few small subcentimeter liver hypodensities likely cysts or hemangiomas. Displaced somewhat comminuted chronic fracture involving the right superior ramus and bilateral symphysis including the junction of the inferior pubic rami bilaterally. Acute to subacute fracture  left superior acetabulum without significant displacement as well as right acetabulum fracture with mild displacement and comminution. Atherosclerotic coronary artery disease. Electronically Signed   By: Marin Olp M.D.   On: 12/11/2015 20:28     Assessment: Impression/Assessment:  Gross hematuria-likely radiation cystitis, improving. History of urethral stricture but 72 French coud catheter was able to be placed.  Plan:  Hematuria adequately cleared Recommend Foley catheter removal early tumor morning Outpatient urology follow-up in 2 weeks for cystoscopy Urology will sign off, please contact us if you have any questions, concerns, or any other questions or if patient fails to void    LOS: 2 days    Hollice Espy 12/13/2015

## 2015-12-13 NOTE — Care Management (Signed)
Paged Sherren Mocha PA for SNF orders.

## 2015-12-13 NOTE — Care Management Important Message (Signed)
Important Message  Patient Details  Name: BODEY LAVANWAY MRN: [id number] Date of Birth: [birth year]   Medicare Important Message Given:  Yes    Juliann Pulse A Aminta Sakurai 12/13/2015, 11:41 AM

## 2015-12-14 LAB — HEMOGLOBIN: Hemoglobin: 9.7 g/dL — ABNORMAL LOW (ref 13.0–18.0)

## 2015-12-14 NOTE — Progress Notes (Signed)
Report called to [facility] place.  Pt was able to void on his own without being cathed. Pt sent out via wheelchair to wifes waiting are

## 2015-12-14 NOTE — Progress Notes (Signed)
Patient ID: Geoffrey Barber, male   DOB: 10-Aug-1924, 82 y.o.   MRN: [id number] [facility] PROGRESS NOTE  Geoffrey Barber [id number] DOB: [birth year] DOA: 12/11/2015 PCP: Marcello Fennel, MD  HPI/Subjective: Patient feels okay, urine has cleared. Hemoglobin stabilized.  Objective: Filed Vitals:   12/14/15 0733 12/14/15 0917  BP: 118/71 122/70  Pulse: 69   Temp: 98.1 F (36.7 C)   Resp: 18     Filed Weights   12/11/15 1641 12/12/15 0045  Weight: 99.791 kg (220 lb) 97.932 kg (215 lb 14.4 oz)    ROS: Review of Systems  Constitutional: Negative for fever and chills.  Eyes: Negative for blurred vision.  Respiratory: Negative for cough and shortness of breath.   Cardiovascular: Negative for chest pain.  Gastrointestinal: Negative for nausea, vomiting, abdominal pain, diarrhea and constipation.  Genitourinary: Negative for dysuria and hematuria.  Musculoskeletal: Negative for joint pain.  Neurological: Negative for dizziness and headaches.   Exam: Physical Exam  Constitutional: He is oriented to person, place, and time.  HENT:  Nose: No mucosal edema.  Mouth/Throat: No oropharyngeal exudate or posterior oropharyngeal edema.  Eyes: Conjunctivae, EOM and lids are normal. Pupils are equal, round, and reactive to light.  Neck: No JVD present. Carotid bruit is not present. No edema present. No thyroid mass and no thyromegaly present.  Cardiovascular: S1 normal and S2 normal.  Exam reveals no gallop.   No murmur heard. Pulses:      Dorsalis pedis pulses are 2+ on the right side, and 2+ on the left side.  Respiratory: No respiratory distress. He has no wheezes. He has no rhonchi. He has no rales.  GI: Soft. Bowel sounds are normal. There is no tenderness.  Musculoskeletal:       Right ankle: He exhibits no swelling.       Left ankle: He exhibits no swelling.  Lymphadenopathy:    He has no cervical adenopathy.  Neurological: He is alert and oriented to person, place, and  time. No cranial nerve deficit.  Skin: Skin is warm. No rash noted. Nails show no clubbing.  Psychiatric: He has a normal mood and affect.      Data Reviewed: Basic Metabolic Panel:  Recent Labs Lab 12/11/15 1651  NA 132*  K 3.8  CL 100*  CO2 22  GLUCOSE 108*  BUN 28*  CREATININE 0.90  CALCIUM 8.6*   CBC:  Recent Labs Lab 12/11/15 1651 12/11/15 2146 12/12/15 0439 12/13/15 0542 12/14/15 0448  WBC 6.2 6.1 5.7  --   --   NEUTROABS  --  4.2  --   --   --   HGB 10.2* 10.1* 10.2* 9.6* 9.7*  HCT 30.5* 30.3* 29.9*  --   --   MCV 86.5 86.2 84.3  --   --   PLT 350 335 322  --   --     Scheduled Meds: . amLODipine  10 mg Oral Daily  . cholecalciferol  1,000 Units Oral Daily  . dapsone  25 mg Oral QODAY  . hydrochlorothiazide  25 mg Oral Daily  . pantoprazole  40 mg Oral QAC breakfast  . potassium chloride SA  20 mEq Oral Daily  . PRESERVISION AREDS 2  1 capsule Oral BID  . terazosin  5 mg Oral QPM    Assessment/Plan:  1. Hematuria, abnormal CTscan showing  Debris in the bladder which could be a mass or focal hemorrhage.  Plavix on hold.  Patient seen by urology and theyrecommended  discontinuing the Foley catheter this am and doing a voiding trial.  As per patient he does intermittent self catheterizations. A  Voiding trial after foley removed today. 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 2. Essential hypertension on usual medications 3. History of prostate cancer status post radiation 4. Anemia likely from chronic blood loss. hemoglobin 5. Gastroesophageal reflux disease on PPI  Code Status:     Code Status Orders        Start     Ordered   12/12/15 0042  Full code   Continuous     12/12/15 0041    Code Status History    Date Active Date Inactive Code Status Order ID Comments User Context   This patient has a current code status but no historical code status.     Disposition Plan: likely to rehabilitation over the weekend  Consultants:  urology  Time  spent: 35 minutes  Loletha Grayer  [employer]

## 2015-12-14 NOTE — Clinical Social Work Note (Signed)
Patient to discharge to [facility] today if he can void after foley removed. Patient and wife are aware and are in agreement with this. CSW informed that if patient chooses to go by ambulance, that insurance may not cover it. Patient and wife have chosen to transport via car to [place]. Patient's nurse is aware and has contact info to call report.  Shela Leff MSW,LCSW [phone]

## 2015-12-19 ENCOUNTER — Encounter
Admission: RE | Admit: 2015-12-19 | Discharge: 2015-12-19 | Disposition: A | Discharge status: Home / Self Care | Payer: Medicare Other | Source: Ambulatory Visit | Attending: Internal Medicine | Admitting: Internal Medicine

## 2015-12-19 LAB — COMPREHENSIVE METABOLIC PANEL
ALT: 18 U/L (ref 17–63)
ANION GAP: 8 (ref 5–15)
AST: 20 U/L (ref 15–41)
Albumin: 3.6 g/dL (ref 3.5–5.0)
Alkaline Phosphatase: 147 U/L — ABNORMAL HIGH (ref 38–126)
BILIRUBIN TOTAL: 0.4 mg/dL (ref 0.3–1.2)
BUN: 33 mg/dL — ABNORMAL HIGH (ref 6–20)
CHLORIDE: 100 mmol/L — AB (ref 101–111)
CO2: 24 mmol/L (ref 22–32)
Calcium: 8.6 mg/dL — ABNORMAL LOW (ref 8.9–10.3)
Creatinine, Ser: 1.12 mg/dL (ref 0.61–1.24)
GFR, EST NON AFRICAN AMERICAN: 56 mL/min — AB (ref >60)
Glucose, Bld: 116 mg/dL — ABNORMAL HIGH (ref 65–99)
POTASSIUM: 4.2 mmol/L (ref 3.5–5.1)
Sodium: 132 mmol/L — ABNORMAL LOW (ref 135–145)
TOTAL PROTEIN: 6.7 g/dL (ref 6.5–8.1)

## 2015-12-19 LAB — CBC WITH DIFFERENTIAL/PLATELET
BASOS ABS: 0 10*3/uL (ref 0–0.1)
Basophils Relative: 1 %
EOS PCT: 2 %
Eosinophils Absolute: 0.1 10*3/uL (ref 0–0.7)
HCT: 30.5 % — ABNORMAL LOW (ref 40.0–52.0)
Hemoglobin: 10.2 g/dL — ABNORMAL LOW (ref 13.0–18.0)
LYMPHS PCT: 20 %
Lymphs Abs: 1.4 10*3/uL (ref 1.0–3.6)
MCH: 28.3 pg (ref 26.0–34.0)
MCHC: 33.4 g/dL (ref 32.0–36.0)
MCV: 84.9 fL (ref 80.0–100.0)
MONO ABS: 0.6 10*3/uL (ref 0.2–1.0)
MONOS PCT: 9 %
Neutro Abs: 4.8 10*3/uL (ref 1.4–6.5)
Neutrophils Relative %: 68 %
PLATELETS: 365 10*3/uL (ref 150–440)
RBC: 3.59 MIL/uL — ABNORMAL LOW (ref 4.40–5.90)
RDW: 16.3 % — AB (ref 11.5–14.5)
WBC: 6.9 10*3/uL (ref 3.8–10.6)

## 2015-12-23 ENCOUNTER — Ambulatory Visit: Payer: Self-pay | Admitting: Urology

## 2015-12-24 LAB — CBC WITH DIFFERENTIAL/PLATELET
BASOS ABS: 0.1 10*3/uL (ref 0–0.1)
BASOS PCT: 1 %
EOS ABS: 0.1 10*3/uL (ref 0–0.7)
EOS PCT: 2 %
HEMATOCRIT: 28.9 % — AB (ref 40.0–52.0)
Hemoglobin: 9.9 g/dL — ABNORMAL LOW (ref 13.0–18.0)
Lymphocytes Relative: 22 %
Lymphs Abs: 1.2 10*3/uL (ref 1.0–3.6)
MCH: 28.9 pg (ref 26.0–34.0)
MCHC: 34.1 g/dL (ref 32.0–36.0)
MCV: 84.8 fL (ref 80.0–100.0)
MONO ABS: 0.5 10*3/uL (ref 0.2–1.0)
Monocytes Relative: 10 %
NEUTROS ABS: 3.4 10*3/uL (ref 1.4–6.5)
Neutrophils Relative %: 65 %
PLATELETS: 343 10*3/uL (ref 150–440)
RBC: 3.41 MIL/uL — ABNORMAL LOW (ref 4.40–5.90)
RDW: 16.5 % — AB (ref 11.5–14.5)
WBC: 5.3 10*3/uL (ref 3.8–10.6)

## 2015-12-24 LAB — COMPREHENSIVE METABOLIC PANEL
ALBUMIN: 3.4 g/dL — AB (ref 3.5–5.0)
ALT: 15 U/L — ABNORMAL LOW (ref 17–63)
ANION GAP: 8 (ref 5–15)
AST: 18 U/L (ref 15–41)
Alkaline Phosphatase: 131 U/L — ABNORMAL HIGH (ref 38–126)
BUN: 30 mg/dL — AB (ref 6–20)
CHLORIDE: 104 mmol/L (ref 101–111)
CO2: 22 mmol/L (ref 22–32)
Calcium: 8.5 mg/dL — ABNORMAL LOW (ref 8.9–10.3)
Creatinine, Ser: 0.97 mg/dL (ref 0.61–1.24)
GFR calc Af Amer: >60 mL/min (ref >60)
GFR calc non Af Amer: >60 mL/min (ref >60)
GLUCOSE: 113 mg/dL — AB (ref 65–99)
POTASSIUM: 3.9 mmol/L (ref 3.5–5.1)
SODIUM: 134 mmol/L — AB (ref 135–145)
Total Bilirubin: 0.3 mg/dL (ref 0.3–1.2)
Total Protein: 6.4 g/dL — ABNORMAL LOW (ref 6.5–8.1)

## 2015-12-25 ENCOUNTER — Encounter: Payer: Self-pay | Admitting: Emergency Medicine

## 2015-12-25 ENCOUNTER — Inpatient Hospital Stay
Admission: EM | Admit: 2015-12-25 | Discharge: 2016-01-02 | DRG: 853 | Disposition: A | Discharge status: Skilled Nursing Facility | Payer: Medicare Other | Source: Skilled Nursing Facility | Attending: Internal Medicine | Admitting: Internal Medicine

## 2015-12-25 DIAGNOSIS — Z923 Personal history of irradiation: Secondary | ICD-10-CM

## 2015-12-25 DIAGNOSIS — M1990 Unspecified osteoarthritis, unspecified site: Secondary | ICD-10-CM | POA: Diagnosis present

## 2015-12-25 DIAGNOSIS — R0602 Shortness of breath: Secondary | ICD-10-CM

## 2015-12-25 DIAGNOSIS — I10 Essential (primary) hypertension: Secondary | ICD-10-CM | POA: Diagnosis present

## 2015-12-25 DIAGNOSIS — J9601 Acute respiratory failure with hypoxia: Secondary | ICD-10-CM | POA: Diagnosis not present

## 2015-12-25 DIAGNOSIS — Z87891 Personal history of nicotine dependence: Secondary | ICD-10-CM | POA: Diagnosis not present

## 2015-12-25 DIAGNOSIS — N3041 Irradiation cystitis with hematuria: Secondary | ICD-10-CM | POA: Diagnosis present

## 2015-12-25 DIAGNOSIS — J9811 Atelectasis: Secondary | ICD-10-CM | POA: Diagnosis not present

## 2015-12-25 DIAGNOSIS — Z8546 Personal history of malignant neoplasm of prostate: Secondary | ICD-10-CM | POA: Diagnosis not present

## 2015-12-25 DIAGNOSIS — H9190 Unspecified hearing loss, unspecified ear: Secondary | ICD-10-CM | POA: Diagnosis present

## 2015-12-25 DIAGNOSIS — D62 Acute posthemorrhagic anemia: Secondary | ICD-10-CM | POA: Diagnosis present

## 2015-12-25 DIAGNOSIS — Y95 Nosocomial condition: Secondary | ICD-10-CM | POA: Diagnosis present

## 2015-12-25 DIAGNOSIS — R0902 Hypoxemia: Secondary | ICD-10-CM

## 2015-12-25 DIAGNOSIS — Z8551 Personal history of malignant neoplasm of bladder: Secondary | ICD-10-CM | POA: Diagnosis not present

## 2015-12-25 DIAGNOSIS — R0682 Tachypnea, not elsewhere classified: Secondary | ICD-10-CM

## 2015-12-25 DIAGNOSIS — N400 Enlarged prostate without lower urinary tract symptoms: Secondary | ICD-10-CM | POA: Diagnosis present

## 2015-12-25 DIAGNOSIS — C61 Malignant neoplasm of prostate: Secondary | ICD-10-CM | POA: Diagnosis not present

## 2015-12-25 DIAGNOSIS — R935 Abnormal findings on diagnostic imaging of other abdominal regions, including retroperitoneum: Secondary | ICD-10-CM

## 2015-12-25 DIAGNOSIS — K219 Gastro-esophageal reflux disease without esophagitis: Secondary | ICD-10-CM | POA: Diagnosis present

## 2015-12-25 DIAGNOSIS — B9620 Unspecified Escherichia coli [E. coli] as the cause of diseases classified elsewhere: Secondary | ICD-10-CM | POA: Diagnosis present

## 2015-12-25 DIAGNOSIS — J189 Pneumonia, unspecified organism: Secondary | ICD-10-CM | POA: Diagnosis present

## 2015-12-25 DIAGNOSIS — I2699 Other pulmonary embolism without acute cor pulmonale: Secondary | ICD-10-CM

## 2015-12-25 DIAGNOSIS — A4159 Other Gram-negative sepsis: Principal | ICD-10-CM | POA: Diagnosis present

## 2015-12-25 DIAGNOSIS — Y842 Radiological procedure and radiotherapy as the cause of abnormal reaction of the patient, or of later complication, without mention of misadventure at the time of the procedure: Secondary | ICD-10-CM | POA: Diagnosis present

## 2015-12-25 DIAGNOSIS — R319 Hematuria, unspecified: Secondary | ICD-10-CM | POA: Diagnosis present

## 2015-12-25 DIAGNOSIS — R7881 Bacteremia: Secondary | ICD-10-CM | POA: Diagnosis not present

## 2015-12-25 LAB — BASIC METABOLIC PANEL
Anion gap: 10 (ref 5–15)
BUN: 34 mg/dL — AB (ref 6–20)
CO2: 20 mmol/L — ABNORMAL LOW (ref 22–32)
CREATININE: 0.94 mg/dL (ref 0.61–1.24)
Calcium: 8.4 mg/dL — ABNORMAL LOW (ref 8.9–10.3)
Chloride: 101 mmol/L (ref 101–111)
GFR calc Af Amer: >60 mL/min (ref >60)
Glucose, Bld: 131 mg/dL — ABNORMAL HIGH (ref 65–99)
POTASSIUM: 4 mmol/L (ref 3.5–5.1)
SODIUM: 131 mmol/L — AB (ref 135–145)

## 2015-12-25 LAB — CBC
HEMATOCRIT: 28.9 % — AB (ref 40.0–52.0)
HEMOGLOBIN: 9.8 g/dL — AB (ref 13.0–18.0)
MCH: 28.6 pg (ref 26.0–34.0)
MCHC: 33.7 g/dL (ref 32.0–36.0)
MCV: 85 fL (ref 80.0–100.0)
Platelets: 338 10*3/uL (ref 150–440)
RBC: 3.41 MIL/uL — AB (ref 4.40–5.90)
RDW: 16.8 % — ABNORMAL HIGH (ref 11.5–14.5)
WBC: 9.9 10*3/uL (ref 3.8–10.6)

## 2015-12-25 LAB — URINALYSIS COMPLETE WITH MICROSCOPIC (ARMC ONLY)
BACTERIA UA: NONE SEEN
SPECIFIC GRAVITY, URINE: 1.023 (ref 1.005–1.030)
Squamous Epithelial / LPF: NONE SEEN

## 2015-12-25 LAB — HEMOGLOBIN: HEMOGLOBIN: 8.7 g/dL — AB (ref 13.0–18.0)

## 2015-12-25 MED ORDER — ONDANSETRON HCL 4 MG PO TABS: 4.0000 mg | ORAL_TABLET | Freq: Four times a day (QID) | ORAL | Status: DC | PRN

## 2015-12-25 MED ORDER — SODIUM CHLORIDE 0.9 % IV BOLUS (SEPSIS)
1000.0000 mL | Freq: Once | INTRAVENOUS | Status: AC
  Administered 2015-12-25: 1000 mL via INTRAVENOUS

## 2015-12-25 MED ORDER — MORPHINE SULFATE (PF) 2 MG/ML IV SOLN
2.0000 mg | Freq: Four times a day (QID) | INTRAVENOUS | Status: DC | PRN
  Administered 2015-12-25: 2 mg via INTRAVENOUS
  Filled 2015-12-25: qty 1

## 2015-12-25 MED ORDER — HYDROCODONE-ACETAMINOPHEN 5-325 MG PO TABS
1.0000 | ORAL_TABLET | ORAL | Status: DC | PRN
  Administered 2015-12-25 – 2015-12-28 (×2): 2 via ORAL
  Filled 2015-12-25: qty 2

## 2015-12-25 MED ORDER — LISINOPRIL 10 MG PO TABS
10.0000 mg | ORAL_TABLET | Freq: Every day | ORAL | Status: DC
  Administered 2015-12-25 – 2016-01-02 (×9): 10 mg via ORAL
  Filled 2015-12-25 (×9): qty 1

## 2015-12-25 MED ORDER — ADULT MULTIVITAMIN W/MINERALS CH
1.0000 | ORAL_TABLET | Freq: Every day | ORAL | Status: DC
  Administered 2015-12-25 – 2016-01-02 (×10): 1 via ORAL
  Filled 2015-12-25 (×9): qty 1

## 2015-12-25 MED ORDER — DAPSONE 25 MG PO TABS
25.0000 mg | ORAL_TABLET | ORAL | Status: DC
  Administered 2015-12-25 – 2016-01-02 (×5): 25 mg via ORAL
  Filled 2015-12-25 (×6): qty 1

## 2015-12-25 MED ORDER — SENNOSIDES-DOCUSATE SODIUM 8.6-50 MG PO TABS
1.0000 | ORAL_TABLET | Freq: Every evening | ORAL | Status: DC | PRN
  Administered 2015-12-29: 1 via ORAL
  Filled 2015-12-25: qty 1

## 2015-12-25 MED ORDER — PANTOPRAZOLE SODIUM 40 MG PO TBEC
40.0000 mg | DELAYED_RELEASE_TABLET | Freq: Every day | ORAL | Status: DC
  Administered 2015-12-25 – 2016-01-02 (×9): 40 mg via ORAL
  Filled 2015-12-25 (×9): qty 1

## 2015-12-25 MED ORDER — HYDROCODONE-ACETAMINOPHEN 5-325 MG PO TABS
ORAL_TABLET | ORAL | Status: AC
  Administered 2015-12-28: 2 via ORAL
  Filled 2015-12-25: qty 2

## 2015-12-25 MED ORDER — ACETAMINOPHEN 325 MG PO TABS
650.0000 mg | ORAL_TABLET | Freq: Four times a day (QID) | ORAL | Status: DC | PRN
  Administered 2015-12-26 – 2015-12-27 (×2): 650 mg via ORAL
  Filled 2015-12-25 (×2): qty 2

## 2015-12-25 MED ORDER — TERAZOSIN HCL 5 MG PO CAPS
5.0000 mg | ORAL_CAPSULE | Freq: Every day | ORAL | Status: DC
  Administered 2015-12-25 – 2016-01-01 (×8): 5 mg via ORAL
  Filled 2015-12-25 (×2): qty 1
  Filled 2015-12-25 (×2): qty 5
  Filled 2015-12-25 (×5): qty 1

## 2015-12-25 MED ORDER — VITAMIN D 1000 UNITS PO TABS
1000.0000 [IU] | ORAL_TABLET | Freq: Every day | ORAL | Status: DC
  Administered 2015-12-25 – 2016-01-02 (×9): 1000 [IU] via ORAL
  Filled 2015-12-25 (×9): qty 1

## 2015-12-25 MED ORDER — PSYLLIUM 95 % PO PACK
1.0000 | PACK | Freq: Every day | ORAL | Status: DC
  Administered 2015-12-26 – 2015-12-28 (×2): 1 via ORAL
  Administered 2015-12-30 – 2015-12-31 (×2): via ORAL
  Filled 2015-12-25 (×7): qty 1

## 2015-12-25 MED ORDER — AMLODIPINE BESYLATE 5 MG PO TABS
5.0000 mg | ORAL_TABLET | Freq: Every day | ORAL | Status: DC
  Administered 2015-12-25 – 2016-01-02 (×9): 5 mg via ORAL
  Filled 2015-12-25 (×9): qty 1

## 2015-12-25 MED ORDER — OCUVITE-LUTEIN PO CAPS
1.0000 | ORAL_CAPSULE | Freq: Two times a day (BID) | ORAL | Status: DC
  Administered 2015-12-25 – 2016-01-02 (×16): 1 via ORAL
  Filled 2015-12-25 (×17): qty 1

## 2015-12-25 MED ORDER — ONDANSETRON HCL 4 MG/2ML IJ SOLN: 4.0000 mg | Freq: Four times a day (QID) | INTRAMUSCULAR | Status: DC | PRN

## 2015-12-25 MED ORDER — BELLADONNA ALKALOIDS-OPIUM 16.2-60 MG RE SUPP
1.0000 | Freq: Three times a day (TID) | RECTAL | Status: DC | PRN
  Administered 2015-12-28: 1 via RECTAL
  Filled 2015-12-25: qty 1

## 2015-12-25 NOTE — ED Provider Notes (Signed)
-----------------------------------------   8:43 AM on 12/25/2015 -----------------------------------------  Patient with Foley now draining with large amounts of blood clots. Requires continuous bladder irrigation to keep Foley draining.  ----------------------------------------- 9:40 AM on 12/25/2015 -----------------------------------------  I discussed with Dr. Erlene Quan, urology. She states the patient needs admitting admit to medicine and she will see on the floor.  I d/w Dr. Benjie Karvonen, [facility] who will admit.  Ponciano Ort, MD 12/25/15 [phone]

## 2015-12-25 NOTE — Consult Note (Signed)
Urology Consult  I have been asked to see the patient by Dr. Robet Leu, for evaluation and management of gross hematuria.  Chief Complaint: gross hematuria  History of Present Illness: Geoffrey Barber is a 82 y.o. year old with a history of radiation therapy for prostate cancer, urethral stricture, and gross hematuria who was admitted just last week with gross hematuria/clot retention. At that time, he was having trouble emptying the bladder and having abdominal discomfort. He tried to self catheterize himself but he does irregularly for the purpose of keeping his urethral stricture patent but was unsuccessful.  Noncontrast CT scan showed a 27 x 13 mm depended mass in the bladder, most likely clot. He ultimately had aggressive hand irrigation of his bladder.  An 84 [place] coud was in place during that admission.  His plavix was stopped. He ultimately passed a voiding trial and was discharged home.    He returned to the emergency room today with further episodes of gross hematuria and lower abdominal pain. In the emergency room, an 23 [place] 3-way was again placed but continued to clot off. He was hand irrigated by the nurse for some fairly significant clot burden.  His hemoglobin has remained stable around 10.  In terms of prostate cancer, he did have radiation for prostate.  He thinks his PSA was check recently at the [place] and was noted to be normal. He saw Dr. Jacqlyn Larsen in 2015. A PSA was less than 0.1 at that time. Also he reports he's had urethral dilations in the past for urethral stricture. He reports gross hematuria about 3 years ago and cystoscopy at the [place] which he was told was radiation cystitis but does not recall urethral dilation at that time.   Past Medical History  Diagnosis Date  . Cancer ([place])   . Hypertension   . Arthritis     History reviewed. No pertinent past surgical history.  Home Medications:    Medication List    ASK your doctor about these medications        acetaminophen 325 MG tablet  Commonly known as:  TYLENOL  Take 650 mg by mouth every 6 (six) hours as needed for mild pain.     amLODipine 5 MG tablet  Commonly known as:  NORVASC  Take 5 mg by mouth daily.     dapsone 25 MG tablet  Take 25 mg by mouth every other day.     lisinopril 10 MG tablet  Commonly known as:  PRINIVIL,ZESTRIL  Take 10 mg by mouth daily.     multivitamin with minerals tablet  Take 1 tablet by mouth daily.     PRESERVISION AREDS Caps  Take 1 capsule by mouth 2 (two) times daily.     omeprazole 20 MG capsule  Commonly known as:  PRILOSEC  Take 20 mg by mouth daily.     potassium chloride SA 20 MEQ tablet  Commonly known as:  K-DUR,KLOR-CON  Take 20 mEq by mouth daily.     psyllium 58.6 % powder  Commonly known as:  METAMUCIL  Take 1 packet by mouth daily.     terazosin 5 MG capsule  Commonly known as:  HYTRIN  Take 5 mg by mouth at bedtime.     traMADol 50 MG tablet  Commonly known as:  ULTRAM  Take 1 tablet (50 mg total) by mouth every 6 (six) hours as needed.     Vitamin D3 1000 units Caps  Take 1 capsule by  mouth daily.        Allergies: No Known Allergies  History reviewed. No pertinent family history.  Social History:  reports that he has quit smoking. He does not have any smokeless tobacco history on file. He reports that he does not drink alcohol. His drug history is not on file.  ROS: A complete review of systems was performed.  All systems are negative except for pertinent findings as noted.  Physical Exam:  Vital signs in last 24 hours: Temp:  [97.9 F (36.6 C)-98.7 F (37.1 C)] 98.7 F (37.1 C) (06/07 1633) Pulse Rate:  [82-104] 99 (06/07 1633) Resp:  [15-24] 17 (06/07 1633) BP: (94-174)/(56-97) 94/56 mmHg (06/07 1633) SpO2:  [90 %-96 %] 95 % (06/07 1633) Weight:  [215 lb (97.523 kg)] 215 lb (97.523 kg) (06/07 0555) Constitutional:  Alert and oriented, No acute distress.  Wife at bedside.   HEENT: [place] AT, moist mucus  membranes.  Trachea midline, no masses Cardiovascular: Regular rate and rhythm, no clubbing, cyanosis, or edema. Respiratory: Normal respiratory effort, lungs clear bilaterally GI: Abdomen is soft, nontender, nondistended, no abdominal masses.  Mild lower abd tenderness.  Radiation tatoo visible.   GU: Uncirced phallus.   Skin: No rashes, bruises or suspicious lesions Lymph: No cervical or inguinal adenopathy Neurologic: Grossly intact, no focal deficits, moving all 4 extremities Psychiatric: Normal mood and affect   Laboratory Data:   Recent Labs  12/24/15 0837 12/25/15 0701 12/25/15 1634  WBC 5.3 9.9  --   HGB 9.9* 9.8* 8.7*  HCT 28.9* 28.9*  --     Recent Labs  12/24/15 0837 12/25/15 0701  NA 134* 131*  K 3.9 4.0  CL 104 101  CO2 22 20*  GLUCOSE 113* 131*  BUN 30* 34*  CREATININE 0.97 0.94  CALCIUM 8.5* 8.4*   Component     Latest Ref Rng 12/25/2015  Color, Urine     YELLOW RED (A)  Appearance     CLEAR TURBID (A)  Glucose     NEGATIVE mg/dL TEST NOT REPORTED DUE TO COLOR INTERFERENCE OF URINE PIGMENT (A)  Bilirubin Urine     NEGATIVE TEST NOT REPORTED DUE TO COLOR INTERFERENCE OF URINE PIGMENT (A)  Ketones, ur     NEGATIVE mg/dL TEST NOT REPORTED DUE TO COLOR INTERFERENCE OF URINE PIGMENT (A)  Specific Gravity, Urine     1.005 - 1.030 1.023  Hgb urine dipstick     NEGATIVE TEST NOT REPORTED DUE TO COLOR INTERFERENCE OF URINE PIGMENT (A)  pH     5.0 - 8.0 TEST NOT REPORTED DUE TO COLOR INTERFERENCE OF URINE PIGMENT  Protein     NEGATIVE mg/dL TEST NOT REPORTED DUE TO COLOR INTERFERENCE OF URINE PIGMENT (A)  Nitrite     NEGATIVE TEST NOT REPORTED DUE TO COLOR INTERFERENCE OF URINE PIGMENT (A)  Leukocytes, UA     NEGATIVE TEST NOT REPORTED DUE TO COLOR INTERFERENCE OF URINE PIGMENT (A)  RBC / HPF     0 - 5 RBC/hpf TOO NUMEROUS TO COUNT  WBC, UA     0 - 5 WBC/hpf TOO NUMEROUS TO COUNT  Bacteria, UA     NONE SEEN NONE SEEN  Squamous Epithelial / LPF      NONE SEEN NONE SEEN    Radiologic Imaging: Previous CT stone reviewed today  Procedure: 18 French 3-way catheter was removed. He was then prepped and draped in the standard sterile fashion. An 98 [place] council tip catheter was inserted  without difficulty and the balloon was filled with 10 cc of sterile water. The patient was then hand irrigated with 2 L of fluid at which time approximately 100 cc of old small clots were evacuated. At the end of hand irrigation, the urine was very light  pink. There is no evidence of active bleeding therefore no indication for CBI.  Impression/Assessment:  82 year old male with history of radiation cystitis readmitted with recurrent gross hematuria status post Foley catheter exchange and hand irrigation today at bedside at which time he was fairly adequately cleared. No evidence of active severe bleeding.  Plan:  1. Gross hematuria - recommend hand irrigation every shift for the time being.  - No indication for continuous bladder irrigation at this time.  - Urine culture order placed as this was not performed on last occasion to rule out urinary tract infection as the underlying cause of bleeding.  - Patient will need an outpatient cystoscopy for further workup.   - Monitor H&H.   - Continue to hold plavix.  2. Radiation cystitis If not other bladder pathology on cysto, will likely benefit from outpt hyperbaric 02  3. History of prostate cancer Followed at the [place], presumed NED Most recent documented PSA on our system undetectable 2015  12/25/2015, 6:04 PM  Hollice Espy,  MD   Thank you for involving me in this patient's care, I will continue to follow along. Please page with any further questions or concerns.

## 2015-12-25 NOTE — ED Notes (Signed)
Pt arrived by EMS from [place] with c/o hematuria, post self cathing. Pt A&O x4.

## 2015-12-25 NOTE — H&P (Signed)
[name] NAME: Geoffrey Barber    MR#:  [id number]  DATE OF BIRTH:  02/21/1925  DATE OF ADMISSION:  12/25/2015  PRIMARY CARE PHYSICIAN: BABAOFF, Caryl Bis, MD   REQUESTING/REFERRING PHYSICIAN: Dr Marlon Pel  CHIEF COMPLAINT:   Hematuria HISTORY OF PRESENT ILLNESS:  Geoffrey Barber  is a 82 y.o. male with a known history of Prostate cancer, urethral stricture who was discharged about a week ago for hematuria. Patient was discharged to skilled nursing facility and presents today with hematuria. During last hospitalization patient had a CT scan which showed debris in the bladder which could be a mass or focal hemorrhage. Plavix was discontinued. Patient was seen by urology during that hospitalization. At discharge patient's hematuria had resolved. Dr. Erlene Quan was called by the ER physician and recommendations were to continue CBI. PAST MEDICAL HISTORY:   Past Medical History  Diagnosis Date  . Cancer ([place])   . Hypertension   . Arthritis     PAST SURGICAL HISTORY:  Hemorrhoidectomy Retinal detachment surgery Total knee arthroplasty Vasectomy  SOCIAL HISTORY:   Social History  Substance Use Topics  . Smoking status: Former Research scientist (life sciences)  . Smokeless tobacco: Not on file  . Alcohol Use: No    FAMILY HISTORY:  History of lung cancer (father)  DRUG ALLERGIES:  No Known Allergies  REVIEW OF SYSTEMS:   Review of Systems  Constitutional: Negative for fever, chills and malaise/fatigue.  HENT: Positive for hearing loss. Negative for ear discharge, ear pain, nosebleeds and sore throat.   Eyes: Negative for blurred vision and pain.  Respiratory: Negative for cough, hemoptysis, shortness of breath and wheezing.   Cardiovascular: Negative for chest pain, palpitations and leg swelling.  Gastrointestinal: Negative for nausea, vomiting, abdominal pain, diarrhea and blood in stool.  Genitourinary: Positive for hematuria. Negative for dysuria.   Musculoskeletal: Negative for back pain.  Neurological: Positive for weakness. Negative for dizziness, tremors, speech change, focal weakness, seizures and headaches.  Endo/Heme/Allergies: Does not bruise/bleed easily.  Psychiatric/Behavioral: Negative for depression, suicidal ideas and hallucinations.    MEDICATIONS AT HOME:   Prior to Admission medications   Medication Sig Start Date End Date Taking? Authorizing Provider  acetaminophen (TYLENOL) 325 MG tablet Take 650 mg by mouth every 6 (six) hours as needed for mild pain.   Yes Historical Provider, MD  amLODipine (NORVASC) 5 MG tablet Take 5 mg by mouth daily.   Yes Historical Provider, MD  Cholecalciferol (VITAMIN D3) 1000 units CAPS Take 1 capsule by mouth daily.   Yes Historical Provider, MD  dapsone 25 MG tablet Take 25 mg by mouth every other day.   Yes Historical Provider, MD  lisinopril (PRINIVIL,ZESTRIL) 10 MG tablet Take 10 mg by mouth daily.   Yes Historical Provider, MD  Multiple Vitamins-Minerals (MULTIVITAMIN WITH MINERALS) tablet Take 1 tablet by mouth daily.   Yes Historical Provider, MD  Multiple Vitamins-Minerals (PRESERVISION AREDS) CAPS Take 1 capsule by mouth 2 (two) times daily.   Yes Historical Provider, MD  omeprazole (PRILOSEC) 20 MG capsule Take 20 mg by mouth daily.   Yes Historical Provider, MD  potassium chloride SA (K-DUR,KLOR-CON) 20 MEQ tablet Take 20 mEq by mouth daily.   Yes Historical Provider, MD  psyllium (METAMUCIL) 58.6 % powder Take 1 packet by mouth daily.   Yes Historical Provider, MD  terazosin (HYTRIN) 5 MG capsule Take 5 mg by mouth at bedtime.    Yes Historical Provider, MD  traMADol Veatrice Bourbon) 50  MG tablet Take 1 tablet (50 mg total) by mouth every 6 (six) hours as needed. 12/13/15  Yes Loletha Grayer, MD      VITAL SIGNS:  Blood pressure 141/72, pulse 85, temperature 98.4 F (36.9 C), temperature source Oral, resp. rate 15, height 6' (1.829 m), weight 97.523 kg (215 lb), SpO2 92  %.  PHYSICAL EXAMINATION:   Physical Exam  Constitutional: He is oriented to person, place, and time and well-developed, well-nourished, and in no distress. No distress.  HENT:  Head: Normocephalic.  Eyes: No scleral icterus.  Neck: Normal range of motion. Neck supple. No JVD present. No tracheal deviation present.  Cardiovascular: Normal rate, regular rhythm and normal heart sounds.  Exam reveals no gallop and no friction rub.   No murmur heard. Pulmonary/Chest: Effort normal and breath sounds normal. No respiratory distress. He has no wheezes. He has no rales. He exhibits no tenderness.  Abdominal: Soft. Bowel sounds are normal. He exhibits no distension and no mass. There is no tenderness. There is no rebound and no guarding.  Genitourinary:  CBI with red [place] punch colored urine  Musculoskeletal: Normal range of motion. He exhibits no edema.  Neurological: He is alert and oriented to person, place, and time.  Skin: Skin is warm. No rash noted. No erythema.  Psychiatric: Affect and judgment normal.      LABORATORY PANEL:   CBC  Recent Labs Lab 12/25/15 0701  WBC 9.9  HGB 9.8*  HCT 28.9*  PLT 338   ------------------------------------------------------------------------------------------------------------------  Chemistries   Recent Labs Lab 12/24/15 0837 12/25/15 0701  NA 134* 131*  K 3.9 4.0  CL 104 101  CO2 22 20*  GLUCOSE 113* 131*  BUN 30* 34*  CREATININE 0.97 0.94  CALCIUM 8.5* 8.4*  AST 18  --   ALT 15*  --   ALKPHOS 131*  --   BILITOT 0.3  --    ------------------------------------------------------------------------------------------------------------------  Cardiac Enzymes No results for input(s): TROPONINI in the last 168 hours. ------------------------------------------------------------------------------------------------------------------  RADIOLOGY:  No results found.  EKG:     IMPRESSION AND PLAN:   82 year old male with a  history of radiation and prostate cancer who presents with, tree of likely related to radiation cystitis.  1. Hematuria: Continue CBI. Follow-up in urology requisitions. Follow hemoglobin every 8 hours. Transfuse if necessary.  2. Essential hypertension: Blood pressure is acceptable. Continue Norvasc, lisinopril and HCTZ  3. BPH: Continue Hytrin 4. GERD: Continue PPI  All the records are reviewed and case discussed with ED provider. Management plans discussed with the patient and he in agreement  CODE STATUS: FULL  TOTAL TIME TAKING CARE OF THIS PATIENT: 45 minutes.    Nalee Lightle M.D on 12/25/2015 at 10:24 AM  Between 7am to 6pm - Pager - [phone]  After 6pm go to [email or web address] - password [email or web address]  Office  [phone]  CC: Primary care physician; BABAOFF, Caryl Bis, MD

## 2015-12-25 NOTE — Progress Notes (Signed)
[facility] provided pastoral care & prayer for pt & family.  [place] Geoffrey Barber

## 2015-12-25 NOTE — ED Notes (Signed)
Catheter flushed multiple times per MD order, multiple clots in return, continuous bladder irrigation infusing wide open, catheter has become clotted multiple times, RN flushed catheter with blood clots in return

## 2015-12-25 NOTE — ED Provider Notes (Signed)
[facility] Emergency Department Provider Note   ____________________________________________  Time seen: Approximately 6:09 AM  I have reviewed the triage vital signs and the nursing notes.   HISTORY  Chief Complaint Hematuria    HPI JUVENCIO HALVORSON is a 82 y.o. male who comes into the hospital today with blood in his urine. The patient reports that since 1 AM he's been having blood clots just been stopping up his ability to urinate. He was here for the same thing and discharged a week ago on Saturday. He reports that he had a catheter in place and they removed it. He reports the catheter some self multiple times a day but he Himself twice tonight and the blood stopped up catheter. The patient lives at [facility] and has been in the nursing home unit.The patient reports that he is not fully able to empty his bladder and he feels as though he has to urinate again. The patient is uncomfortable at this time. He's had no fevers and no chest pain but he has had some mild nausea. He is here for evaluation and treatment.   Past Medical History  Diagnosis Date  . Cancer ([place])   . Hypertension   . Arthritis     Patient Active Problem List   Diagnosis Date Noted  . Hematuria 12/11/2015    History reviewed. No pertinent past surgical history.  Current Outpatient Rx  Name  Route  Sig  Dispense  Refill  . acetaminophen (TYLENOL) 325 MG tablet   Oral   Take 650 mg by mouth every 6 (six) hours as needed for mild pain.         Marland Kitchen amLODipine (NORVASC) 5 MG tablet   Oral   Take 5 mg by mouth daily.         . Cholecalciferol (VITAMIN D3) 1000 units CAPS   Oral   Take 1 capsule by mouth daily.         . dapsone 25 MG tablet   Oral   Take 25 mg by mouth every other day.         . lisinopril (PRINIVIL,ZESTRIL) 10 MG tablet   Oral   Take 10 mg by mouth daily.         . Multiple Vitamins-Minerals (MULTIVITAMIN WITH MINERALS) tablet    Oral   Take 1 tablet by mouth daily.         . Multiple Vitamins-Minerals (PRESERVISION AREDS) CAPS   Oral   Take 1 capsule by mouth 2 (two) times daily.         Marland Kitchen omeprazole (PRILOSEC) 20 MG capsule   Oral   Take 20 mg by mouth daily.         . potassium chloride SA (K-DUR,KLOR-CON) 20 MEQ tablet   Oral   Take 20 mEq by mouth daily.         . psyllium (METAMUCIL) 58.6 % powder   Oral   Take 1 packet by mouth daily.         Marland Kitchen terazosin (HYTRIN) 5 MG capsule   Oral   Take 5 mg by mouth at bedtime.          . traMADol (ULTRAM) 50 MG tablet   Oral   Take 1 tablet (50 mg total) by mouth every 6 (six) hours as needed.   30 tablet   0     Allergies Review of patient's allergies indicates no known allergies.  History reviewed. No pertinent family  history.  Social History Social History  Substance Use Topics  . Smoking status: Former Research scientist (life sciences)  . Smokeless tobacco: None  . Alcohol Use: No    Review of Systems Constitutional: No fever/chills Eyes: No visual changes. ENT: No sore throat. Cardiovascular: Denies chest pain. Respiratory: Denies shortness of breath. Gastrointestinal:  abdominal pain, nausea, no vomiting.  No diarrhea.  No constipation. Genitourinary:Hematuria Musculoskeletal: Negative for back pain. Skin: Negative for rash. Neurological: Negative for headaches, focal weakness or numbness.  10-point ROS otherwise negative.  ____________________________________________   PHYSICAL EXAM:  VITAL SIGNS: ED Triage Vitals  Enc Vitals Group     BP 12/25/15 0555 171/97 mmHg     Pulse Rate 12/25/15 0555 93     Resp 12/25/15 0555 15     Temp 12/25/15 0555 98.4 F (36.9 C)     Temp Source 12/25/15 0555 Oral     SpO2 12/25/15 0553 94 %     Weight 12/25/15 0555 215 lb (97.523 kg)     Height 12/25/15 0555 6' (1.829 m)     Head Cir --      Peak Flow --      Pain Score --      Pain Loc --      Pain Edu? --      Excl. in [place]? --      Constitutional: Alert and oriented. Well appearing and in moderate distress. Eyes: Conjunctivae are normal. PERRL. EOMI. Head: Atraumatic. Nose: No congestion/rhinnorhea. Mouth/Throat: Mucous membranes are moist.  Oropharynx non-erythematous. Cardiovascular: Normal rate, regular rhythm. Grossly normal heart sounds.  Good peripheral circulation. Respiratory: Normal respiratory effort.  No retractions. Lungs CTAB. Gastrointestinal: Soft with suprapubic tenderness to palpation. No distention. Positive  Genitourinary:  Musculoskeletal: No lower extremity tenderness nor edema.   Neurologic:  Normal speech and language.  Skin:  Skin is warm, dry and intact.  Psychiatric: Mood and affect are normal.   ____________________________________________   LABS (all labs ordered are listed, but only abnormal results are displayed)  Labs Reviewed  CBC - Abnormal; Notable for the following:    RBC 3.41 (*)    Hemoglobin 9.8 (*)    HCT 28.9 (*)    RDW 16.8 (*)    All other components within normal limits  URINALYSIS COMPLETEWITH MICROSCOPIC ([facility] ONLY) - Abnormal; Notable for the following:    Color, Urine RED (*)    APPearance TURBID (*)    Glucose, UA   (*)    Value: TEST NOT REPORTED DUE TO COLOR INTERFERENCE OF URINE PIGMENT   Bilirubin Urine   (*)    Value: TEST NOT REPORTED DUE TO COLOR INTERFERENCE OF URINE PIGMENT   Ketones, ur   (*)    Value: TEST NOT REPORTED DUE TO COLOR INTERFERENCE OF URINE PIGMENT   Hgb urine dipstick   (*)    Value: TEST NOT REPORTED DUE TO COLOR INTERFERENCE OF URINE PIGMENT   Protein, ur   (*)    Value: TEST NOT REPORTED DUE TO COLOR INTERFERENCE OF URINE PIGMENT   Nitrite   (*)    Value: TEST NOT REPORTED DUE TO COLOR INTERFERENCE OF URINE PIGMENT   Leukocytes, UA   (*)    Value: TEST NOT REPORTED DUE TO COLOR INTERFERENCE OF URINE PIGMENT   All other components within normal limits  BASIC METABOLIC PANEL - Abnormal; Notable for the following:     Sodium 131 (*)    CO2 20 (*)    Glucose, Bld 131 (*)  BUN 34 (*)    Calcium 8.4 (*)    All other components within normal limits   ____________________________________________  EKG  none ____________________________________________  RADIOLOGY  none ____________________________________________   PROCEDURES  Procedure(s) performed: None  Critical Care performed: No  ____________________________________________   INITIAL IMPRESSION / ASSESSMENT AND PLAN / ED COURSE  Pertinent labs & imaging results that were available during my care of the patient were reviewed by me and considered in my medical decision making (see chart for details).   This is an 36 -year-old male who comes to the hospital today with hematuria. The patient was seen here and admitted previously for a bladder washout with hematuria. We'll check the patient's blood work and insert a 3-way catheter into place. I will check the patient's urine and determine if the patient has some significant amount of blood and needs to be washed out. The patient will also receive a liter of normal saline.  The patient's care will be signed out to Dr. Robet Leu who will follow-up the patient's bladder irrigation. The nurse reports that they attempted to irrigate the patient's bladder and is not draining. We'll consider changing out the Foley catheter. ____________________________________________   FINAL CLINICAL IMPRESSION(S) / ED DIAGNOSES  Final diagnoses:  Hematuria      NEW MEDICATIONS STARTED DURING THIS VISIT:  New Prescriptions   No medications on file     Note:  This document was prepared using Dragon voice recognition software and may include unintentional dictation errors.    Loney Hering, MD 12/25/15 [phone]

## 2015-12-26 ENCOUNTER — Inpatient Hospital Stay: Payer: Medicare Other

## 2015-12-26 ENCOUNTER — Encounter: Payer: Self-pay | Admitting: Radiology

## 2015-12-26 ENCOUNTER — Telehealth: Payer: Self-pay | Admitting: Urology

## 2015-12-26 DIAGNOSIS — R319 Hematuria, unspecified: Secondary | ICD-10-CM

## 2015-12-26 LAB — HEMOGLOBIN
HEMOGLOBIN: 7.9 g/dL — AB (ref 13.0–18.0)
HEMOGLOBIN: 8.3 g/dL — AB (ref 13.0–18.0)
HEMOGLOBIN: 8.8 g/dL — AB (ref 13.0–18.0)

## 2015-12-26 LAB — URINALYSIS COMPLETE WITH MICROSCOPIC (ARMC ONLY)
BILIRUBIN URINE: NEGATIVE
Bacteria, UA: NONE SEEN
GLUCOSE, UA: NEGATIVE mg/dL
HGB URINE DIPSTICK: NEGATIVE
Ketones, ur: NEGATIVE mg/dL
LEUKOCYTES UA: NEGATIVE
NITRITE: NEGATIVE
Protein, ur: NEGATIVE mg/dL
SQUAMOUS EPITHELIAL / LPF: NONE SEEN
Specific Gravity, Urine: 1.022 (ref 1.005–1.030)

## 2015-12-26 LAB — BLOOD GAS, ARTERIAL
ALLENS TEST (PASS/FAIL): POSITIVE — AB
Acid-base deficit: 0.1 mmol/L (ref 0.0–2.0)
BICARBONATE: 22.9 meq/L (ref 21.0–28.0)
FIO2: 0.32
O2 Saturation: 97.4 %
PATIENT TEMPERATURE: 37.8
PH ART: 7.48 — AB (ref 7.350–7.450)
pCO2 arterial: 31 mmHg — ABNORMAL LOW (ref 32.0–48.0)
pO2, Arterial: 92 mmHg (ref 83.0–108.0)

## 2015-12-26 LAB — BASIC METABOLIC PANEL
ANION GAP: 7 (ref 5–15)
BUN: 38 mg/dL — AB (ref 6–20)
CALCIUM: 8.1 mg/dL — AB (ref 8.9–10.3)
CO2: 21 mmol/L — ABNORMAL LOW (ref 22–32)
Chloride: 105 mmol/L (ref 101–111)
Creatinine, Ser: 1.05 mg/dL (ref 0.61–1.24)
GFR calc Af Amer: >60 mL/min (ref >60)
GLUCOSE: 112 mg/dL — AB (ref 65–99)
POTASSIUM: 3.9 mmol/L (ref 3.5–5.1)
SODIUM: 133 mmol/L — AB (ref 135–145)

## 2015-12-26 LAB — CBC
HCT: 25.2 % — ABNORMAL LOW (ref 40.0–52.0)
HEMOGLOBIN: 8.4 g/dL — AB (ref 13.0–18.0)
MCH: 28.1 pg (ref 26.0–34.0)
MCHC: 33.2 g/dL (ref 32.0–36.0)
MCV: 84.4 fL (ref 80.0–100.0)
PLATELETS: 268 10*3/uL (ref 150–440)
RBC: 2.98 MIL/uL — AB (ref 4.40–5.90)
RDW: 16.9 % — ABNORMAL HIGH (ref 11.5–14.5)
WBC: 21.6 10*3/uL — ABNORMAL HIGH (ref 3.8–10.6)

## 2015-12-26 MED ORDER — MORPHINE SULFATE (PF) 2 MG/ML IV SOLN: 2.0000 mg | Freq: Four times a day (QID) | INTRAVENOUS | Status: DC | PRN

## 2015-12-26 MED ORDER — IOPAMIDOL (ISOVUE-370) INJECTION 76%
100.0000 mL | Freq: Once | INTRAVENOUS | Status: AC | PRN
  Administered 2015-12-26: 100 mL via INTRAVENOUS

## 2015-12-26 MED ORDER — PIPERACILLIN-TAZOBACTAM 3.375 G IVPB 30 MIN
3.3750 g | Freq: Three times a day (TID) | INTRAVENOUS | Status: DC
  Administered 2015-12-26 (×2): 3.375 g via INTRAVENOUS
  Filled 2015-12-26 (×5): qty 50

## 2015-12-26 MED ORDER — VANCOMYCIN HCL 10 G IV SOLR
1250.0000 mg | Freq: Once | INTRAVENOUS | Status: AC
  Administered 2015-12-27: 1250 mg via INTRAVENOUS
  Filled 2015-12-26: qty 1250

## 2015-12-26 MED ORDER — LEVOFLOXACIN IN D5W 750 MG/150ML IV SOLN
750.0000 mg | INTRAVENOUS | Status: DC
  Administered 2015-12-26: 750 mg via INTRAVENOUS
  Filled 2015-12-26: qty 150

## 2015-12-26 MED ORDER — ENOXAPARIN SODIUM 100 MG/ML ~~LOC~~ SOLN: 1.0000 mg/kg | Freq: Two times a day (BID) | SUBCUTANEOUS | Status: DC

## 2015-12-26 MED ORDER — SODIUM CHLORIDE 0.9 % IV SOLN
1250.0000 mg | INTRAVENOUS | Status: DC
  Filled 2015-12-26: qty 1250

## 2015-12-26 MED ORDER — VANCOMYCIN HCL 10 G IV SOLR
1250.0000 mg | Freq: Once | INTRAVENOUS | Status: AC
  Administered 2015-12-26: 1250 mg via INTRAVENOUS
  Filled 2015-12-26 (×2): qty 1250

## 2015-12-26 NOTE — Progress Notes (Signed)
Pharmacy Antibiotic Note  Geoffrey Barber is a 82 y.o. male admitted on 12/25/2015 with pneumonia.  Pharmacy has been consulted for vancomycin dosing.  Plan: Will order Vancomcyin 1250 mg IV once with a stacked dose in 18 hours. Will then initiate maintenance dosing of Vancomycin 1250 mg IV q18h. Goal trough 15-20 mcg/mL. Will order trough prior to 4th dose at 0630 on 6/11 (should be at steady state).   AdjBW: 88.6 kg, est CrCl~57.7 mL/min, ke: 0.052, t1/2: 13.3 h, Vd: 59.9 L  Height: 6' (182.9 cm) Weight: 215 lb (97.523 kg) IBW/kg (Calculated) : 77.6  Temp (24hrs), Avg:99.1 F (37.3 C), Min:98.3 F (36.8 C), Max:100.1 F (37.8 C)   Recent Labs Lab 12/24/15 0837 12/25/15 0701 12/26/15 0449  WBC 5.3 9.9 21.6*  CREATININE 0.97 0.94 1.05    Estimated Creatinine Clearance: 57.7 mL/min (by C-G formula based on Cr of 1.05).    No Known Allergies  Antimicrobials this admission: 6/7 dapsone >>  6/8 Zosyn >>  6/8 Vancomycin>>   Microbiology results: 6/8 BCx: pending 6/8 UCx: pending   Thank you for allowing pharmacy to be a part of this patient's care.  Brick Ketcher G 12/26/2015 4:20 PM

## 2015-12-26 NOTE — Progress Notes (Signed)
Urology Consult Follow Up  Subjective: Urine cola colored this a.m. Hbg relatively stable.  Hand irrigated with a few small clots, clear to very light P ink.Prior to hand irrigation, patient was noted to be somewhat confused, tachypnic, and in mild respiratory distress with persisted throughout procedure.    CXR, blood cultures ordered in setting of increasing leukocytosis to 21 today and temp to 100.1.  UCx from yesterday pending.    Anti-infectives: Anti-infectives    Start     Dose/Rate Route Frequency Ordered Stop   12/25/15 1630  dapsone tablet 25 mg     25 mg Oral Every other day 12/25/15 1623        Current Facility-Administered Medications  Medication Dose Route Frequency Provider Last Rate Last Dose  . acetaminophen (TYLENOL) tablet 650 mg  650 mg Oral Q6H PRN Sital Mody, MD      . amLODipine (NORVASC) tablet 5 mg  5 mg Oral Daily Bettey Costa, MD   5 mg at 12/26/15 1014  . cholecalciferol (VITAMIN D) tablet 1,000 Units  1,000 Units Oral Daily Bettey Costa, MD   1,000 Units at 12/26/15 1014  . dapsone tablet 25 mg  25 mg Oral QODAY Bettey Costa, MD   25 mg at 12/25/15 1858  . HYDROcodone-acetaminophen (NORCO/VICODIN) 5-325 MG per tablet 1-2 tablet  1-2 tablet Oral Q4H PRN Bettey Costa, MD   2 tablet at 12/25/15 1041  . lisinopril (PRINIVIL,ZESTRIL) tablet 10 mg  10 mg Oral Daily Bettey Costa, MD   10 mg at 12/26/15 1012  . morphine 2 MG/ML injection 2 mg  2 mg Intravenous Q6H PRN Bettey Costa, MD   2 mg at 12/25/15 1207  . multivitamin with minerals tablet 1 tablet  1 tablet Oral Daily Bettey Costa, MD   1 tablet at 12/26/15 1013  . multivitamin-lutein (OCUVITE-LUTEIN) capsule 1 capsule  1 capsule Oral BID Bettey Costa, MD   1 capsule at 12/26/15 1012  . ondansetron (ZOFRAN) tablet 4 mg  4 mg Oral Q6H PRN Bettey Costa, MD       Or  . ondansetron (ZOFRAN) injection 4 mg  4 mg Intravenous Q6H PRN Sital Mody, MD      . opium-belladonna (B&O SUPPRETTES) 16.2-60 MG suppository 1 suppository  1  suppository Rectal Q8H PRN Hollice Espy, MD      . pantoprazole (PROTONIX) EC tablet 40 mg  40 mg Oral Daily Bettey Costa, MD   40 mg at 12/26/15 1014  . psyllium (HYDROCIL/METAMUCIL) packet 1 packet  1 packet Oral Daily Bettey Costa, MD   1 packet at 12/26/15 1014  . senna-docusate (Senokot-S) tablet 1 tablet  1 tablet Oral QHS PRN Bettey Costa, MD      . terazosin (HYTRIN) capsule 5 mg  5 mg Oral QHS Bettey Costa, MD   5 mg at 12/25/15 2102     Objective: Vital signs in last 24 hours: Temp:  [98.7 F (37.1 C)-100.1 F (37.8 C)] 100.1 F (37.8 C) (06/08 0428) Pulse Rate:  [90-111] 111 (06/08 1229) Resp:  [17-25] 25 (06/08 1229) BP: (94-138)/(52-84) 138/84 mmHg (06/08 1229) SpO2:  [91 %-96 %] 91 % (06/08 1229)  Intake/Output from previous day: 06/07 0701 - 06/08 0700 In: 3000  Out: 9820 [Urine:9820] Intake/Output this shift: Total I/O In: 120 [P.O.:120] Out: -    Physical Exam  Constitutional:  Mild distress.  Somewhat confused, did not recall meeting me yesterday.    HENT:  Head: Normocephalic and atraumatic.  Hard of hearing.  Cardiovascular:  Tachycardic   Pulmonary/Chest: He is in respiratory distress. He has wheezes.  Increased WOB, tachypnic.  Abdominal: Soft. He exhibits no distension and no mass. There is no tenderness.  Genitourinary:  Foley draining cola colored urine prior to irrigation.  Hand irrigated with 1 L saline and evacuated ~10 small clots.  Skin: Skin is warm and dry.    Lab Results:   Recent Labs  12/25/15 0701  12/26/15 0449 12/26/15 0930  WBC 9.9  --  21.6*  --   HGB 9.8*  < > 8.4* 8.8*  HCT 28.9*  --  25.2*  --   PLT 338  --  268  --   < > = values in this interval not displayed. BMET  Recent Labs  12/25/15 0701 12/26/15 0449  NA 131* 133*  K 4.0 3.9  CL 101 105  CO2 20* 21*  GLUCOSE 131* 112*  BUN 34* 38*  CREATININE 0.94 1.05  CALCIUM 8.4* 8.1*   Studies/Results: Dg Chest Port 1 View  12/26/2015  CLINICAL DATA:  Hematuria  EXAM: PORTABLE CHEST 1 VIEW COMPARISON:  06/15/2005 FINDINGS: Cardiomediastinal silhouette is stable. There is streaky left basilar atelectasis or infiltrate. No pulmonary edema. Mild degenerative changes thoracic spine. IMPRESSION: Streaky left basilar atelectasis or infiltrate.  No pulmonary edema. Electronically Signed   By: Lahoma Crocker M.D.   On: 12/26/2015 13:09     Assessment: 82 year old male with history of radiation cystitis readmitted with recurrent gross hematuria.  Today, he appears to be decompensating possibly due to infection with low grade temp, leukocytosis to 21, tachypnia with decreased 02 sats, tachycardia.    Plan: 1. Gross hematuria - recommend hand irrigation every shift for the time being.  - No indication for continuous bladder irrigation at this time.  - Urine culture from yesterday pending - Patient will need an outpatient cystoscopy for further workup.  - Monitor H&H.  - Continue to hold plavix.  2. Radiation cystitis If not other bladder pathology on cysto, will likely benefit from outpt hyperbaric 02  3. History of prostate cancer Followed at the [place], presumed NED Most recent documented PSA on our system undetectable 2015  4. Early sepsis as above, suspect urinary source based on UA yesterday and evolving clinic picture, Dr. Margaretmary Eddy made aware. -CXR -Blood cultures -f/u urine cultures -abx/ fluids per primary team -supportive care   LOS: 1 day    Hollice Espy 12/26/2015

## 2015-12-26 NOTE — Telephone Encounter (Signed)
-----   Message from Hollice Espy, MD sent at 12/25/2015  6:37 PM EDT ----- Regarding: f/u for cyso This patient needs out cysto in 1-2 weeks, with any MD.  Being discharged from hospital likely tomorrow.  Hollice Espy, MD

## 2015-12-26 NOTE — Evaluation (Signed)
Physical Therapy Evaluation Patient Details Name: Geoffrey Barber MRN: [id number] DOB: [birth year] Today's Date: 12/26/2015   History of Present Illness  Geoffrey Barber  is a 82 y.o. male with a known history of Prostate cancer, urethral stricture who was discharged about a week ago for hematuria. Patient was discharged to skilled nursing facility and presents today with hematuria. During last hospitalization patient had a CT scan which showed debris in the bladder which could be a mass or focal hemorrhage. Plavix was discontinued. Patient was seen by urology during that hospitalization. At discharge patient's hematuria had resolved. Dr. Erlene Quan was called by the ER physician and recommendations were to continue CBI. Plan is to follow-up for outpatient cystoscopy. Pt reports 2 falls in the last 12 months.  Clinical Impression  Pt demonstrates considerable weakness with bed mobility, transfers, and ambulation. He requires modA+1 to come to standing and minA+1 for safety with ambulation. He requires chair follow for safety due to LE weakness and instability. Pt is limited in his ambulation distance and is currently unsafe to return home. He will need to return to SNF at discharge in order to facilitate safe return to prior level of function at home. Pt will benefit from skilled PT services to address deficits in strength, balance, and mobility in order to return to full function at home.     Follow Up Recommendations SNF    Equipment Recommendations       Recommendations for Other Services       Precautions / Restrictions Precautions Precautions: Fall Restrictions Weight Bearing Restrictions: No      Mobility  Bed Mobility Overal bed mobility: Needs Assistance Bed Mobility: Supine to Sit     Supine to sit: Mod assist     General bed mobility comments: Exiting to R side pt requiring assistance with bilateral LE as well as trunk support when moving from R sidelying to sitting. Once  upright pt is steady and stable in sitting   Transfers Overall transfer level: Needs assistance Equipment used: Rolling walker (2 wheeled) Transfers: Sit to/from Stand Sit to Stand: Min assist         General transfer comment: Pt requires extended time to come to standing. He requires modA+1 for transfer but once upright is steady in standing with bilateral UE support on walker  Ambulation/Gait Ambulation/Gait assistance: Min assist Ambulation Distance (Feet): 40 Feet Assistive device: Rolling walker (2 wheeled) Gait Pattern/deviations: Decreased step length - right;Decreased step length - left Gait velocity: Decreased Gait velocity interpretation: Below normal speed for age/gender General Gait Details: Gait is slow with cues required to keep walker close to body for safety. He denies DOE and VSS throughout ambualtion. Chair follow for safety due to LE deconditioning and weakness. Pt is a high fall risk. Gait continues to slow throughout ambulation distance  Stairs            Wheelchair Mobility    Modified Rankin (Stroke Patients Only)       Balance Overall balance assessment: Needs assistance Sitting-balance support: No upper extremity supported Sitting balance-Leahy Scale: Good     Standing balance support: Bilateral upper extremity supported Standing balance-Leahy Scale: Poor                               Pertinent Vitals/Pain Pain Assessment: 0-10 Pain Score: 5  Pain Location: Suprapubic area    Home Living Family/patient expects to be discharged to:: Skilled nursing facility  Living Arrangements: Spouse/significant other   Type of Home: Apartment Home Access: Elevator     Home Layout: One level Home Equipment: [place] - 2 wheels;Bedside commode;Cane - quad;Cane - single point;Grab bars - toilet;Walker - 4 wheels Additional Comments: Pt at [facility] since last admission    Prior Function Level of Independence: Needs  assistance   Gait / Transfers Assistance Needed: Pt uses a quad cane vs rolling walker for ambulation. Also has rollator that he uses intermittently  ADL's / Homemaking Assistance Needed: Wife helps with buttons and shoes.  Comments: Assist from wife with cooking and other IADLs     Hand Dominance   Dominant Hand: Right    Extremity/Trunk Assessment   Upper Extremity Assessment: Generalized weakness       LUE Deficits / Details: L UE weakness from history of CVA, weakness primarily in L shoulder (4-/5). Otherwise L elbow flex/ext and hand strength appears at least 4/5. RUE with deconditioning but at least 4 to 4+/5 with MMT   Lower Extremity Assessment: Generalized weakness   LLE Deficits / Details: minimal L foot drop with ambulation. Decreased bilateral hip flexion of 3/5.     Communication   Communication: HOH  Cognition Arousal/Alertness: Awake/alert Behavior During Therapy: WFL for tasks assessed/performed Overall Cognitive Status: Within Functional Limits for tasks assessed                      General Comments General comments (skin integrity, edema, etc.): Foley with blood tinged urine    Exercises General Exercises - Lower Extremity Long Arc Quad: Strengthening;Both;10 reps;Seated Heel Slides: Strengthening;Both;10 reps;Seated Hip ABduction/ADduction: Strengthening;Both;10 reps;Seated Hip Flexion/Marching: Strengthening;Both;10 reps;Seated Heel Raises: Strengthening;Both;10 reps;Seated      Assessment/Plan    PT Assessment Patient needs continued PT services  PT Diagnosis Difficulty walking;Generalized weakness;Abnormality of gait   PT Problem List Decreased strength;Decreased range of motion;Decreased activity tolerance;Decreased balance;Decreased mobility;Decreased safety awareness  PT Treatment Interventions Gait training;Functional mobility training;Therapeutic activities;Therapeutic exercise;Balance training;Patient/family  education;Neuromuscular re-education   PT Goals (Current goals can be found in the Care Plan section) Acute Rehab PT Goals Patient Stated Goal: Return to prior function at home PT Goal Formulation: With patient Time For Goal Achievement: 01/09/16 Potential to Achieve Goals: Fair    Frequency Min 2X/week   Barriers to discharge        Co-evaluation               End of Session Equipment Utilized During Treatment: Gait belt Activity Tolerance: Patient tolerated treatment well Patient left: with call bell/phone within reach;in chair;with chair alarm set;Other (comment) (Phlebotomy drawing labs) Nurse Communication: Mobility status         Time: [id number] PT Time Calculation (min) (ACUTE ONLY): 33 min   Charges:   PT Evaluation $PT Eval Moderate Complexity: 1 Procedure PT Treatments $Therapeutic Exercise: 8-22 mins   PT G Codes:       Lyndel Safe Taquisha Phung PT, DPT   Damion Kant 12/26/2015, 10:51 AM

## 2015-12-26 NOTE — Progress Notes (Signed)
Called to patient room by Dr, Erlene Quan. Patient appeared to be tachypnic with oxygen saturation of 88% on room air. 2L Oxygen applied and saturation of 95% obtained. Patient confused and other VSS. Dr. Erlene Quan ordered chst x ray and blood cultures verbally. Dr. Erlene Quan concerned about elevated white blood cell count. Dr. Margaretmary Eddy notified and urine culture ordered. Nita Sells, RN, primary nurse, entered room and assessed patient. RN still at bedside.

## 2015-12-26 NOTE — Telephone Encounter (Signed)
I will call and schedule the appt   Geoffrey Barber

## 2015-12-26 NOTE — Progress Notes (Signed)
[facility] [place] NAME: Geoffrey Barber    MR#:  [id number]  DATE OF BIRTH:  [birth year]  SUBJECTIVE:  CHIEF COMPLAINT:  Patient is reporting abdominal discomfort. Denies any chest pain or shortness of breath. Still having blood in his urine, eventually patient became short of breath but denies any chest pain  REVIEW OF SYSTEMS:  CONSTITUTIONAL: No fever, fatigue or weakness.  EYES: No blurred or double vision.  EARS, NOSE, AND THROAT: No tinnitus or ear pain.  RESPIRATORY: No cough, Reporting shortness of breath, denies wheezing or hemoptysis.  CARDIOVASCULAR: No chest pain, orthopnea, edema.  GASTROINTESTINAL: No nausea, vomiting, diarrhea or abdominal pain.  GENITOURINARY: No dysuria, hematuria.  ENDOCRINE: No polyuria, nocturia,  HEMATOLOGY: No anemia, easy bruising or bleeding SKIN: No rash or lesion. MUSCULOSKELETAL: No joint pain or arthritis.   NEUROLOGIC: No tingling, numbness, weakness.  PSYCHIATRY: No anxiety or depression.   DRUG ALLERGIES:  No Known Allergies  VITALS:  Blood pressure 138/84, pulse 111, temperature 98.3 F (36.8 C), temperature source Oral, resp. rate 25, height 6' (1.829 m), weight 97.523 kg (215 lb), SpO2 91 %.  PHYSICAL EXAMINATION:  GENERAL:  82 y.o.-year-old patient lying in the bed with no acute distress.  EYES: Pupils equal, round, reactive to light and accommodation. No scleral icterus. Extraocular muscles intact.  HEENT: Head atraumatic, normocephalic. Oropharynx and nasopharynx clear.  NECK:  Supple, no jugular venous distention. No thyroid enlargement, no tenderness.  LUNGS:Moderate breath sounds bilaterally and diminished in the lower lung fields, no wheezing, rales,rhonchi or crepitation. No use of accessory muscles of respiration.  CARDIOVASCULAR: S1, S2 normal. No murmurs, rubs, or gallops.  ABDOMEN: Soft, nontender, nondistended. Bowel sounds present. No organomegaly or mass.   EXTREMITIES: No pedal edema, cyanosis, or clubbing.  NEUROLOGIC: Cranial nerves II through XII are intact. Muscle strength 5/5 in all extremities. Sensation intact. Gait not checked.  PSYCHIATRIC: The patient is alert and oriented x 3.  SKIN: No obvious rash, lesion, or ulcer.    LABORATORY PANEL:   CBC  Recent Labs Lab 12/26/15 0449 12/26/15 0930  WBC 21.6*  --   HGB 8.4* 8.8*  HCT 25.2*  --   PLT 268  --    ------------------------------------------------------------------------------------------------------------------  Chemistries   Recent Labs Lab 12/24/15 0837  12/26/15 0449  NA 134*  < > 133*  K 3.9  < > 3.9  CL 104  < > 105  CO2 22  < > 21*  GLUCOSE 113*  < > 112*  BUN 30*  < > 38*  CREATININE 0.97  < > 1.05  CALCIUM 8.5*  < > 8.1*  AST 18  --   --   ALT 15*  --   --   ALKPHOS 131*  --   --   BILITOT 0.3  --   --   < > = values in this interval not displayed. ------------------------------------------------------------------------------------------------------------------  Cardiac Enzymes No results for input(s): TROPONINI in the last 168 hours. ------------------------------------------------------------------------------------------------------------------  RADIOLOGY:  Dg Chest Port 1 View  12/26/2015  CLINICAL DATA:  Hematuria EXAM: PORTABLE CHEST 1 VIEW COMPARISON:  06/15/2005 FINDINGS: Cardiomediastinal silhouette is stable. There is streaky left basilar atelectasis or infiltrate. No pulmonary edema. Mild degenerative changes thoracic spine. IMPRESSION: Streaky left basilar atelectasis or infiltrate.  No pulmonary edema. Electronically Signed   By: Lahoma Crocker M.D.   On: 12/26/2015 13:09    EKG:   Orders placed or performed in visit on  02/24/12  . 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  #Shortness of breath secondary  to sepsis from healthcare associated pneumonia Blood cultures, urine cultures and sputum culture and sensitivity is ordered. Chest x-ray with possible infiltrate Start him on IV Zosyn and vancomycin. Breathing treatments as needed will be provided. Continue oxygen Monitor WBC closely, WBC at 21.6 today  #. Hematuria: Secondary to radiation cystitis Urology is recommending manual bladder irrigation Monitor hemoglobin and hematocrit closely and transfuse as needed Follow-up in urology recommendations  #. Essential hypertension: Blood pressure is acceptable. Continue Norvasc, lisinopril and HCTZ  #. BPH: Continue Hytrin  #. GERD: Continue PPI   Physical therapy is recommending skilled nursing care  All the records are reviewed and case discussed with Care Management/Social Workerr. Management plans discussed with the patient, family and they are in agreement.  CODE STATUS: fc  TOTAL TIME TAKING CARE OF THIS PATIENT: 39 minutes.   POSSIBLE D/C IN 2=3 DAYS, DEPENDING ON CLINICAL CONDITION.  Note: This dictation was prepared with Dragon dictation along with smaller phrase technology. Any transcriptional errors that result from this process are unintentional.   Nicholes Mango M.D on 12/26/2015 at 3:25 PM  Between 7am to 6pm - Pager - [phone] After 6pm go to [email or web address] - password [email or web address] [facility]  Office  [phone]  CC: Primary care physician; BABAOFF, Caryl Bis, MD

## 2015-12-27 ENCOUNTER — Inpatient Hospital Stay (HOSPITAL_BASED_OUTPATIENT_CLINIC_OR_DEPARTMENT_OTHER)
Admit: 2015-12-27 | Discharge: 2015-12-27 | Disposition: A | Discharge status: Home / Self Care | Payer: Medicare Other | Attending: Internal Medicine | Admitting: Internal Medicine

## 2015-12-27 ENCOUNTER — Encounter: Payer: Self-pay | Admitting: Vascular Surgery

## 2015-12-27 ENCOUNTER — Inpatient Hospital Stay: Payer: Medicare Other

## 2015-12-27 ENCOUNTER — Encounter
Admission: EM | Disposition: A | Discharge status: Skilled Nursing Facility | Payer: Self-pay | Source: Skilled Nursing Facility | Attending: Internal Medicine

## 2015-12-27 DIAGNOSIS — I2699 Other pulmonary embolism without acute cor pulmonale: Secondary | ICD-10-CM

## 2015-12-27 DIAGNOSIS — N3040 Irradiation cystitis without hematuria: Secondary | ICD-10-CM

## 2015-12-27 DIAGNOSIS — R7881 Bacteremia: Secondary | ICD-10-CM

## 2015-12-27 DIAGNOSIS — C61 Malignant neoplasm of prostate: Secondary | ICD-10-CM

## 2015-12-27 HISTORY — PX: PERIPHERAL VASCULAR CATHETERIZATION: SHX172C

## 2015-12-27 LAB — BLOOD CULTURE ID PANEL (REFLEXED)
Acinetobacter baumannii: NOT DETECTED
CANDIDA PARAPSILOSIS: NOT DETECTED
CANDIDA TROPICALIS: NOT DETECTED
CARBAPENEM RESISTANCE: NOT DETECTED
Candida albicans: NOT DETECTED
Candida glabrata: NOT DETECTED
Candida krusei: NOT DETECTED
ENTEROBACTERIACEAE SPECIES: DETECTED — AB
ENTEROCOCCUS SPECIES: NOT DETECTED
Enterobacter cloacae complex: NOT DETECTED
Escherichia coli: DETECTED — AB
Haemophilus influenzae: NOT DETECTED
KLEBSIELLA PNEUMONIAE: NOT DETECTED
Klebsiella oxytoca: NOT DETECTED
LISTERIA MONOCYTOGENES: NOT DETECTED
Methicillin resistance: NOT DETECTED
Neisseria meningitidis: NOT DETECTED
PSEUDOMONAS AERUGINOSA: NOT DETECTED
Proteus species: NOT DETECTED
STAPHYLOCOCCUS AUREUS BCID: NOT DETECTED
STAPHYLOCOCCUS SPECIES: NOT DETECTED
Serratia marcescens: NOT DETECTED
Streptococcus agalactiae: NOT DETECTED
Streptococcus pneumoniae: NOT DETECTED
Streptococcus pyogenes: NOT DETECTED
Streptococcus species: NOT DETECTED
VANCOMYCIN RESISTANCE: NOT DETECTED

## 2015-12-27 LAB — BASIC METABOLIC PANEL
ANION GAP: 8 (ref 5–15)
BUN: 43 mg/dL — ABNORMAL HIGH (ref 6–20)
CO2: 22 mmol/L (ref 22–32)
Calcium: 8 mg/dL — ABNORMAL LOW (ref 8.9–10.3)
Chloride: 103 mmol/L (ref 101–111)
Creatinine, Ser: 1.33 mg/dL — ABNORMAL HIGH (ref 0.61–1.24)
GFR, EST AFRICAN AMERICAN: 53 mL/min — AB (ref >60)
GFR, EST NON AFRICAN AMERICAN: 46 mL/min — AB (ref >60)
Glucose, Bld: 117 mg/dL — ABNORMAL HIGH (ref 65–99)
POTASSIUM: 3.7 mmol/L (ref 3.5–5.1)
SODIUM: 133 mmol/L — AB (ref 135–145)

## 2015-12-27 LAB — ECHOCARDIOGRAM COMPLETE
AV Area mean vel: 2.42 cm2
AV peak Index: 1.02
AV pk vel: 179 cm/s
AV vel: 2.89
AVA: 2.89 cm2
AVAREAMEANVIN: 1.1 cm2/m2
AVAREAVTI: 2.24 cm2
AVAREAVTIIND: 1.31 cm2/m2
AVCELMEANRAT: 0.7
AVG: 7 mmHg
AVPG: 13 mmHg
Ao pk vel: 0.65 m/s
DOP CAL AO MEAN VELOCITY: 117 cm/s
E/e' ratio: 5.87
EWDT: 232 ms
FS: 32 % (ref 28–44)
Height: 72 in
IVS/LV PW RATIO, ED: 1.13
LA ID, A-P, ES: 42 mm
LA diam index: 1.91 cm/m2
LA vol A4C: 44.8 ml
LA vol index: 24.9 mL/m2
LA vol: 54.7 mL
LDCA: 3.46 cm2
LEFT ATRIUM END SYS DIAM: 42 mm
LV E/e'average: 5.87
LV TDI E'MEDIAL: 8.16
LV e' LATERAL: 10.9 cm/s
LVEEMED: 5.87
LVOT VTI: 26.6 cm
LVOT peak grad rest: 5 mmHg
LVOT peak vel: 116 cm/s
LVOTD: 21 mm
LVOTSV: 92 mL
LVOTVTI: 0.83 cm
MV Dec: 232
MV pk A vel: 116 m/s
MVPKEVEL: 64 m/s
PW: 11.7 mm — AB (ref 0.6–1.1)
RV TAPSE: 17.5 mm
TDI e' lateral: 10.9
VTI: 31.9 cm
Valve area index: 1.31
Weight: 3440 oz

## 2015-12-27 LAB — TROPONIN I
TROPONIN I: 0.04 ng/mL — AB (ref <0.031)
Troponin I: 0.06 ng/mL — ABNORMAL HIGH (ref <0.031)
Troponin I: 0.04 ng/mL — ABNORMAL HIGH (ref <0.031)
Troponin I: 0.03 ng/mL (ref <0.031)

## 2015-12-27 LAB — PROCALCITONIN: PROCALCITONIN: 43.1 ng/mL

## 2015-12-27 LAB — CBC
HEMATOCRIT: 24.3 % — AB (ref 40.0–52.0)
HEMOGLOBIN: 8 g/dL — AB (ref 13.0–18.0)
MCH: 28.2 pg (ref 26.0–34.0)
MCHC: 32.7 g/dL (ref 32.0–36.0)
MCV: 86.2 fL (ref 80.0–100.0)
Platelets: 194 10*3/uL (ref 150–440)
RBC: 2.82 MIL/uL — ABNORMAL LOW (ref 4.40–5.90)
RDW: 16.9 % — ABNORMAL HIGH (ref 11.5–14.5)
WBC: 13.6 10*3/uL — AB (ref 3.8–10.6)

## 2015-12-27 LAB — GLUCOSE, CAPILLARY: Glucose-Capillary: 180 mg/dL — ABNORMAL HIGH (ref 65–99)

## 2015-12-27 LAB — HEMOGLOBIN
HEMOGLOBIN: 7.9 g/dL — AB (ref 13.0–18.0)
Hemoglobin: 7.6 g/dL — ABNORMAL LOW (ref 13.0–18.0)

## 2015-12-27 LAB — MRSA PCR SCREENING: MRSA BY PCR: NEGATIVE

## 2015-12-27 SURGERY — IVC FILTER INSERTION
Anesthesia: Moderate Sedation

## 2015-12-27 MED ORDER — HEPARIN (PORCINE) IN NACL 2-0.9 UNIT/ML-% IJ SOLN
INTRAMUSCULAR | Status: AC
  Filled 2015-12-27: qty 500

## 2015-12-27 MED ORDER — IPRATROPIUM-ALBUTEROL 0.5-2.5 (3) MG/3ML IN SOLN
3.0000 mL | Freq: Four times a day (QID) | RESPIRATORY_TRACT | Status: DC
  Administered 2015-12-27 – 2015-12-28 (×6): 3 mL via RESPIRATORY_TRACT
  Filled 2015-12-27 (×6): qty 3

## 2015-12-27 MED ORDER — MIDAZOLAM HCL 2 MG/2ML IJ SOLN
INTRAMUSCULAR | Status: DC | PRN
  Administered 2015-12-27: 1 mg via INTRAVENOUS
  Administered 2015-12-27: 3 mg via INTRAVENOUS

## 2015-12-27 MED ORDER — LIDOCAINE HCL (PF) 1 % IJ SOLN
INTRAMUSCULAR | Status: AC
  Filled 2015-12-27: qty 30

## 2015-12-27 MED ORDER — MIDAZOLAM HCL 5 MG/5ML IJ SOLN
INTRAMUSCULAR | Status: AC
  Filled 2015-12-27: qty 5

## 2015-12-27 MED ORDER — FENTANYL CITRATE (PF) 100 MCG/2ML IJ SOLN
INTRAMUSCULAR | Status: AC
  Filled 2015-12-27: qty 2

## 2015-12-27 MED ORDER — CEFAZOLIN SODIUM-DEXTROSE 2-4 GM/100ML-% IV SOLN
2.0000 g | INTRAVENOUS | Status: AC
  Filled 2015-12-27: qty 100

## 2015-12-27 MED ORDER — IOPAMIDOL (ISOVUE-300) INJECTION 61%
INTRAVENOUS | Status: DC | PRN
  Administered 2015-12-27: 15 mL via INTRAVENOUS

## 2015-12-27 MED ORDER — SODIUM CHLORIDE 0.9 % IV SOLN
1.0000 g | Freq: Three times a day (TID) | INTRAVENOUS | Status: DC
  Administered 2015-12-27 – 2015-12-29 (×6): 1 g via INTRAVENOUS
  Filled 2015-12-27 (×9): qty 1

## 2015-12-27 MED ORDER — SODIUM CHLORIDE 0.9 % IV SOLN
2.0000 g | Freq: Three times a day (TID) | INTRAVENOUS | Status: DC
  Administered 2015-12-27: 2 g via INTRAVENOUS
  Filled 2015-12-27 (×3): qty 2

## 2015-12-27 SURGICAL SUPPLY — 3 items
FILTER VC CELECT-FEMORAL (Filter) ×3 IMPLANT
PACK ANGIOGRAPHY (CUSTOM PROCEDURE TRAY) ×3 IMPLANT
WIRE J 3MM .035X145CM (WIRE) ×3 IMPLANT

## 2015-12-27 NOTE — Progress Notes (Signed)
Urology Consult Follow Up  Subjective:  Patient now in ICU on IV antibiotics as below (meropenem/ vanc).  Today, he continues to decompensate yesterday and ultimately was found to have an acute PE along with bacteremia. His urine is also growing gram-negative yards, speciation pending.    Anti-infectives: Anti-infectives    Start     Dose/Rate Route Frequency Ordered Stop   12/27/15 1900  vancomycin (VANCOCIN) 1,250 mg in sodium chloride 0.9 % 250 mL IVPB     1,250 mg 166.7 mL/hr over 90 Minutes Intravenous Every 18 hours 12/26/15 1614     12/27/15 1015  ceFAZolin (ANCEF) IVPB 2g/100 mL premix     2 g 200 mL/hr over 30 Minutes Intravenous On call to O.R. 12/27/15 1001 12/28/15 0559   12/27/15 0300  meropenem (MERREM) 2 g in sodium chloride 0.9 % 100 mL IVPB     2 g 200 mL/hr over 30 Minutes Intravenous Every 8 hours 12/27/15 0246     12/27/15 0100  vancomycin (VANCOCIN) 1,250 mg in sodium chloride 0.9 % 250 mL IVPB     1,250 mg 166.7 mL/hr over 90 Minutes Intravenous  Once 12/26/15 1614 12/27/15 0410   12/26/15 2100  levofloxacin (LEVAQUIN) IVPB 750 mg  Status:  Discontinued     750 mg 100 mL/hr over 90 Minutes Intravenous Every 24 hours 12/26/15 2051 12/27/15 0246   12/26/15 1630  vancomycin (VANCOCIN) 1,250 mg in sodium chloride 0.9 % 250 mL IVPB     1,250 mg 166.7 mL/hr over 90 Minutes Intravenous  Once 12/26/15 1613 12/26/15 1943   12/26/15 1530  piperacillin-tazobactam (ZOSYN) IVPB 3.375 g  Status:  Discontinued     3.375 g 12.5 mL/hr over 240 Minutes Intravenous Every 8 hours 12/26/15 1523 12/27/15 0246   12/25/15 1630  dapsone tablet 25 mg     25 mg Oral Every other day 12/25/15 1623        Current Facility-Administered Medications  Medication Dose Route Frequency Provider Last Rate Last Dose  . acetaminophen (TYLENOL) tablet 650 mg  650 mg Oral Q6H PRN Bettey Costa, MD   650 mg at 12/26/15 2043  . amLODipine (NORVASC) tablet 5 mg  5 mg Oral Daily Bettey Costa, MD   5 mg at  12/26/15 1014  . ceFAZolin (ANCEF) IVPB 2g/100 mL premix  2 g Intravenous On Call to OR [name], MD      . cholecalciferol (VITAMIN D) tablet 1,000 Units  1,000 Units Oral Daily Bettey Costa, MD   1,000 Units at 12/26/15 1014  . dapsone tablet 25 mg  25 mg Oral QODAY Bettey Costa, MD   25 mg at 12/25/15 1858  . HYDROcodone-acetaminophen (NORCO/VICODIN) 5-325 MG per tablet 1-2 tablet  1-2 tablet Oral Q4H PRN Bettey Costa, MD   2 tablet at 12/25/15 1041  . ipratropium-albuterol (DUONEB) 0.5-2.5 (3) MG/3ML nebulizer solution 3 mL  3 mL Nebulization Q6H Aruna Gouru, MD      . lisinopril (PRINIVIL,ZESTRIL) tablet 10 mg  10 mg Oral Daily Bettey Costa, MD   10 mg at 12/26/15 1012  . meropenem (MERREM) 2 g in sodium chloride 0.9 % 100 mL IVPB  2 g Intravenous Q8H Debby Crosley, MD   2 g at 12/27/15 0407  . morphine 2 MG/ML injection 2 mg  2 mg Intravenous Q6H PRN Nicholes Mango, MD      . multivitamin with minerals tablet 1 tablet  1 tablet Oral Daily Bettey Costa, MD   1 tablet at  12/26/15 2043  . multivitamin-lutein (OCUVITE-LUTEIN) capsule 1 capsule  1 capsule Oral BID Bettey Costa, MD   1 capsule at 12/26/15 2200  . ondansetron (ZOFRAN) tablet 4 mg  4 mg Oral Q6H PRN Bettey Costa, MD       Or  . ondansetron (ZOFRAN) injection 4 mg  4 mg Intravenous Q6H PRN Sital Mody, MD      . opium-belladonna (B&O SUPPRETTES) 16.2-60 MG suppository 1 suppository  1 suppository Rectal Q8H PRN Hollice Espy, MD      . pantoprazole (PROTONIX) EC tablet 40 mg  40 mg Oral Daily Bettey Costa, MD   40 mg at 12/26/15 1014  . psyllium (HYDROCIL/METAMUCIL) packet 1 packet  1 packet Oral Daily Bettey Costa, MD   1 packet at 12/26/15 1014  . senna-docusate (Senokot-S) tablet 1 tablet  1 tablet Oral QHS PRN Bettey Costa, MD      . terazosin (HYTRIN) capsule 5 mg  5 mg Oral QHS Bettey Costa, MD   5 mg at 12/26/15 2043  . vancomycin (VANCOCIN) 1,250 mg in sodium chloride 0.9 % 250 mL IVPB  1,250 mg Intravenous Q18H Crystal G Scarpena, RPH          Objective: Vital signs in last 24 hours: Temp:  [97.5 F (36.4 C)-100.6 F (38.1 C)] 97.5 F (36.4 C) (06/09 1202) Pulse Rate:  [41-111] 87 (06/09 1202) Resp:  [24-40] 28 (06/09 1202) BP: (97-174)/(47-97) 109/59 mmHg (06/09 1202) SpO2:  [91 %-100 %] 98 % (06/09 1202) FiO2 (%):  [30 %] 30 % (06/09 0419)  Intake/Output from previous day: 06/08 0701 - 06/09 0700 In: 120 [P.O.:120] Out: 1980 [Urine:1980] Intake/Output this shift: Total I/O In: -  Out: 425 [Urine:425]   Physical Exam  Constitutional:  Mild distress.    HENT:  Head: Normocephalic and atraumatic.  Hard of hearing.    Cardiovascular:  Tachycardic   Pulmonary/Chest: He is in respiratory distress.  Wearing bipap  Abdominal: Soft. He exhibits no distension and no mass. There is no tenderness.  Genitourinary:  Foley draining merlot colored urine prior to irrigation.    Skin: Skin is warm and dry.    Lab Results:   Recent Labs  12/26/15 0449  12/27/15 0015 12/27/15 0659  WBC 21.6*  --   --  13.6*  HGB 8.4*  < > 7.6* 8.0*  HCT 25.2*  --   --  24.3*  PLT 268  --   --  194  < > = values in this interval not displayed. BMET  Recent Labs  12/26/15 0449 12/27/15 0659  NA 133* 133*  K 3.9 3.7  CL 105 103  CO2 21* 22  GLUCOSE 112* 117*  BUN 38* 43*  CREATININE 1.05 1.33*  CALCIUM 8.1* 8.0*   Studies/Results: Ct Angio Chest Pe W/cm &/or Wo Cm  12/26/2015  CLINICAL DATA:  Sudden onset shortness of breath EXAM: CT ANGIOGRAPHY CHEST WITH CONTRAST TECHNIQUE: Multidetector CT imaging of the chest was performed using the standard protocol during bolus administration of intravenous contrast. Multiplanar CT image reconstructions and MIPs were obtained to evaluate the vascular anatomy. CONTRAST:  100 cc Isovue 370 intravenous COMPARISON:  None. FINDINGS: THORACIC INLET/BODY WALL: No acute abnormality. MEDIASTINUM: Chronic cardiomegaly. No pericardial effusion. Atherosclerosis, including the coronary  arteries. Dilated ascending aorta at 44 mm. No evidence of acute aortic syndrome. CTA of the pulmonary arteries is intermittently distorted by respiratory motion. Segmental branching pulmonary artery filling defect to the lateral segment right middle lobe. No  right heart strain. LUNG WINDOWS: Trace pleural effusions and mild basilar atelectasis. Mild bronchial airway collapse cyst with bronchomalacia. Negative for pneumonia or edema. No lung infarct. UPPER ABDOMEN: There is bilateral perinephric edema that is new from 12/11/2015, extensive on the right where there is also a hepatorenal fossa fluid. Patient admitted with gross hematuria and possible UTI. The upper pole collecting system seen on the left and there is no hydronephrosis. Collecting system not seen on the right. OSSEOUS: No acute fracture.  No suspicious lytic or blastic lesions. Critical Value/emergent results were called by telephone at the time of interpretation on 12/26/2015 at 10:38 pm to Dr. Quintella Baton , who verbally acknowledged these results. Review of the MIP images confirms the above findings. IMPRESSION: 1. Acute segmental pulmonary embolism to the right middle lobe. 2. Mild atelectasis and trace pleural effusions. 3. New perinephric edema compared to 12/11/2015, particularly extensive on the right. Consider CT or ultrasound to evaluate for urinary obstruction given patient's current urologic disease. 4. Dilated ascending aorta measuring up to 44 mm. If clinically appropriate, recommend annual imaging followup by CTA or MRA. This recommendation follows 2010 ACCF/AHA/AATS/ACR/ASA/SCA/SCAI/SIR/STS/SVM Guidelines for the Diagnosis and Management of Patients with Thoracic Aortic Disease. Circulation. 2010; [id number] Electronically Signed   By: Monte Fantasia M.D.   On: 12/26/2015 22:54   US Renal  12/27/2015  CLINICAL DATA:  Abnormal CT of the abdomen. EXAM: RENAL / URINARY TRACT ULTRASOUND COMPLETE COMPARISON:  None. FINDINGS: Right  Kidney: Length: 11.2 cm. Within the inferior pole of the right kidney there is a cyst measuring 6.1 x 5.7 x 6.0 cm. There is a well-circumscribed solid-appearing structure within the upper pole cortex of the right kidney measuring 1.8 x 2.0 x 1.7 cm. This is isoechoic to the adjacent renal parenchyma. Echogenicity within normal limits. No mass or hydronephrosis visualized. Left Kidney: Length: 11.0 cm. Echogenicity within normal limits. No mass or hydronephrosis visualized. Bladder: Appears normal for degree of bladder distention. IMPRESSION: 1. Indeterminate solid-appearing structure within the upper pole of the right kidney. Cannot rule out neoplasm. Further evaluation with nonemergent renal protocol MRI is recommended. 2. Right inferior pole cyst without complicating features. Electronically Signed   By: Kerby Moors M.D.   On: 12/27/2015 09:18   US Venous Img Lower Bilateral  12/27/2015  CLINICAL DATA:  History of pulmonary embolism and malignancy. Evaluate for DVT. EXAM: BILATERAL LOWER EXTREMITY VENOUS DOPPLER ULTRASOUND TECHNIQUE: Gray-scale sonography with graded compression, as well as color Doppler and duplex ultrasound were performed to evaluate the lower extremity deep venous systems from the level of the common femoral vein and including the common femoral, femoral, profunda femoral, popliteal and calf veins including the posterior tibial, peroneal and gastrocnemius veins when visible. The superficial great saphenous vein was also interrogated. Spectral Doppler was utilized to evaluate flow at rest and with distal augmentation maneuvers in the common femoral, femoral and popliteal veins. COMPARISON:  Bilateral lower extremity venous Doppler ultrasound - 02/24/2012 FINDINGS: RIGHT LOWER EXTREMITY Common Femoral Vein: No evidence of thrombus. Normal compressibility, respiratory phasicity and response to augmentation. Saphenofemoral Junction: No evidence of thrombus. Normal compressibility and flow on  color Doppler imaging. Profunda Femoral Vein: No evidence of thrombus. Normal compressibility and flow on color Doppler imaging. Femoral Vein: No evidence of thrombus. Normal compressibility, respiratory phasicity and response to augmentation. Popliteal Vein: No evidence of thrombus. Normal compressibility, respiratory phasicity and response to augmentation. Calf Veins: No evidence of thrombus. Normal compressibility and flow on color Doppler imaging.  Superficial Great Saphenous Vein: No evidence of thrombus. Normal compressibility and flow on color Doppler imaging. Venous Reflux:  None. Other Findings:  None. LEFT LOWER EXTREMITY Common Femoral Vein: No evidence of thrombus. Normal compressibility, respiratory phasicity and response to augmentation. Saphenofemoral Junction: No evidence of thrombus. Normal compressibility and flow on color Doppler imaging. Profunda Femoral Vein: No evidence of thrombus. Normal compressibility and flow on color Doppler imaging. Femoral Vein: No evidence of thrombus. Normal compressibility, respiratory phasicity and response to augmentation. Popliteal Vein: No evidence of thrombus. Normal compressibility, respiratory phasicity and response to augmentation. Calf Veins: No evidence of thrombus. Normal compressibility and flow on color Doppler imaging. Superficial Great Saphenous Vein: No evidence of thrombus. Normal compressibility and flow on color Doppler imaging. Venous Reflux:  None. Other Findings:  None. IMPRESSION: No evidence of DVT within either lower extremity. Electronically Signed   By: Sandi Mariscal M.D.   On: 12/27/2015 09:20   Dg Chest Port 1 View  12/26/2015  CLINICAL DATA:  Hematuria EXAM: PORTABLE CHEST 1 VIEW COMPARISON:  06/15/2005 FINDINGS: Cardiomediastinal silhouette is stable. There is streaky left basilar atelectasis or infiltrate. No pulmonary edema. Mild degenerative changes thoracic spine. IMPRESSION: Streaky left basilar atelectasis or infiltrate.  No  pulmonary edema. Electronically Signed   By: Lahoma Crocker M.D.   On: 12/26/2015 13:09     Assessment: 82 year old male with history of radiation cystitis readmitted with recurrent gross hematuria, sepsis secondary to UTI/ GNR bacteremia, and PE  Plan: 1. Gross hematuria - plan to switch back to CBI catheter and titrate to light pink - Urine culture from admission pending- Enterococcus/ E.coli - Patient will need an outpatient cystoscopy for further workup.  - Monitor H&H, appears to be mostly stable - Continue to hold plavix, IVC filter placed for PE  2. Radiation cystitis If not other bladder pathology on cysto, will likely benefit from outpt hyperbaric 02  3. History of prostate cancer Followed at the [place], presumed NED Most recent documented PSA on our system undetectable 2015  4. Sepsis, suspect urinary source based on UA  -Blood cultures -f/u urine cultures -abx/ fluids per primary team -supportive care   LOS: 2 days    Hollice Espy 12/27/2015

## 2015-12-27 NOTE — Progress Notes (Signed)
Called  By nursing as the patient is tachpneic respiratory rate of 36-40.  ABg and CT ordered fer reviewing CXR.  CT chest positive for PE. Unable to anticoaggulate d/t gross hematuria. Patient transferred to stepdown. BiPaP started. IR consulted for IVC filter. Critical care aware and will see patient in AM.

## 2015-12-27 NOTE — Consult Note (Signed)
PULMONARY / CRITICAL CARE MEDICINE   Name: Geoffrey Barber MRN: [id number] DOB: [birth year]    ADMISSION DATE:  12/25/2015 CONSULTING PHYSICIAN: Dr. Claria Dice  BRIEF HISTORY: [id number] with PMHx of hematuria 2/2 radiation for prostate caner, hx of urethral stricture, hypertension, arthritis, recent admission for gross hematuria found to have a 87mm mass in the bladder though to be a clot after a insertion of 15F coude, and he passed a void trial; plavix was stopped and he was advised to follow up with his urologist and [facility] PCP.    HPI:  82 y.o. male with a known history of Prostate cancer, urethral stricture who was discharged about a week ago for hematuria. Patient was discharged to skilled nursing facility and represented on 6/7 with hematuria.  Seen by urology, recommends intermittent had irrigation of the bladder and follow up Urine culture, had stable H/H. Started complaining of shortness of breath yesterday, CXR showed possible atelectasis vs infiltrate in the left base, he was started on empiric antibiotics for suspected HCAP.  He continued to have SOB with tachypnea and mildly elevated troponin of 0.6, at which point a CTA Chest was ordered and showed RML segmental pulmonary embolus.  .  STUDIES:  6/8 CTA Chest - trace pl effusions and mild bronchomalacia, no pneumonia, segmental filling defect to the lateral segment RML, no right heart strain.   SIGNIFICANT EVENTS: 5/24-5/27 - admitted for gross hematuria, clot in the bladder found, plavix stopped 6/7 - readmitted for gross hematuria, intermittent bladder irrigation 6/8- developed sob and tachypnea, CTA Chest with RML segmental PE, with no right heart strain  VITAL SIGNS: Temp:  [97.7 F (36.5 C)-100.6 F (38.1 C)] 99.3 F (37.4 C) (06/09 0201) Pulse Rate:  [41-111] 53 (06/09 0500) Resp:  [24-40] 29 (06/09 0500) BP: (97-174)/(47-97) 107/56 mmHg (06/09 0500) SpO2:  [91 %-100 %] 98 % (06/09 0500) FiO2 (%):  [30 %] 30 % (06/09  0419) HEMODYNAMICS:   VENTILATOR SETTINGS: Vent Mode:  [-]  FiO2 (%):  [30 %] 30 % INTAKE / OUTPUT:  Intake/Output Summary (Last 24 hours) at 12/27/15 0732 Last data filed at 12/27/15 0005  Gross per 24 hour  Intake    120 ml  Output   1400 ml  Net  -1280 ml    Review of Systems  Constitutional: Negative for fever and chills.  Eyes: Negative for blurred vision.  Respiratory: Positive for shortness of breath. Negative for cough, hemoptysis, sputum production and wheezing.   Cardiovascular: Positive for chest pain.  Gastrointestinal: Negative for heartburn.  Genitourinary: Positive for hematuria.  Musculoskeletal: Negative for myalgias.  Skin: Negative for rash.  Neurological: Negative for dizziness and headaches.  Endo/Heme/Allergies: Bruises/bleeds easily.  Psychiatric/Behavioral: Negative for depression.    Physical Exam  Constitutional: He is oriented to person, place, and time and well-developed, well-nourished, and in no distress.  HENT:  Head: Normocephalic and atraumatic.  Right Ear: External ear normal.  Left Ear: External ear normal.  Eyes: Conjunctivae and EOM are normal. Pupils are equal, round, and reactive to light.  Neck: Normal range of motion. Neck supple.  Cardiovascular: Normal rate, regular rhythm, normal heart sounds and intact distal pulses.   Pulmonary/Chest: No respiratory distress. He has no wheezes. He has no rales.  Mild tachypnea Shallow BS at the bases  Abdominal: Soft. Bowel sounds are normal.  Musculoskeletal: Normal range of motion.  Neurological: He is alert and oriented to person, place, and time.  Psychiatric: Affect normal.  Nursing note and vitals reviewed.  LABS:  CBC  Recent Labs Lab 12/24/15 0837 12/25/15 0701  12/26/15 0449 12/26/15 0930 12/26/15 1621 12/27/15 0015  WBC 5.3 9.9  --  21.6*  --   --   --   HGB 9.9* 9.8*  < > 8.4* 8.8* 7.9* 7.6*  HCT 28.9* 28.9*  --  25.2*  --   --   --   PLT 343 338  --  268  --    --   --   < > = values in this interval not displayed. Coag's No results for input(s): APTT, INR in the last 168 hours. BMET  Recent Labs Lab 12/24/15 0837 12/25/15 0701 12/26/15 0449  NA 134* 131* 133*  K 3.9 4.0 3.9  CL 104 101 105  CO2 22 20* 21*  BUN 30* 34* 38*  CREATININE 0.97 0.94 1.05  GLUCOSE 113* 131* 112*   Electrolytes  Recent Labs Lab 12/24/15 0837 12/25/15 0701 12/26/15 0449  CALCIUM 8.5* 8.4* 8.1*   Sepsis Markers No results for input(s): LATICACIDVEN, PROCALCITON, O2SATVEN in the last 168 hours. ABG  Recent Labs Lab 12/26/15 2100  PHART 7.48*  PCO2ART 31*  PO2ART 92   Liver Enzymes  Recent Labs Lab 12/24/15 0837  AST 18  ALT 15*  ALKPHOS 131*  BILITOT 0.3  ALBUMIN 3.4*   Cardiac Enzymes  Recent Labs Lab 12/27/15 0015  TROPONINI 0.06*   Glucose  Recent Labs Lab 12/27/15 0156  GLUCAP 180*    Imaging Ct Angio Chest Pe W/cm &/or Wo Cm  12/26/2015  CLINICAL DATA:  Sudden onset shortness of breath EXAM: CT ANGIOGRAPHY CHEST WITH CONTRAST TECHNIQUE: Multidetector CT imaging of the chest was performed using the standard protocol during bolus administration of intravenous contrast. Multiplanar CT image reconstructions and MIPs were obtained to evaluate the vascular anatomy. CONTRAST:  100 cc Isovue 370 intravenous COMPARISON:  None. FINDINGS: THORACIC INLET/BODY WALL: No acute abnormality. MEDIASTINUM: Chronic cardiomegaly. No pericardial effusion. Atherosclerosis, including the coronary arteries. Dilated ascending aorta at 44 mm. No evidence of acute aortic syndrome. CTA of the pulmonary arteries is intermittently distorted by respiratory motion. Segmental branching pulmonary artery filling defect to the lateral segment right middle lobe. No right heart strain. LUNG WINDOWS: Trace pleural effusions and mild basilar atelectasis. Mild bronchial airway collapse cyst with bronchomalacia. Negative for pneumonia or edema. No lung infarct. UPPER  ABDOMEN: There is bilateral perinephric edema that is new from 12/11/2015, extensive on the right where there is also a hepatorenal fossa fluid. Patient admitted with gross hematuria and possible UTI. The upper pole collecting system seen on the left and there is no hydronephrosis. Collecting system not seen on the right. OSSEOUS: No acute fracture.  No suspicious lytic or blastic lesions. Critical Value/emergent results were called by telephone at the time of interpretation on 12/26/2015 at 10:38 pm to Dr. Quintella Baton , who verbally acknowledged these results. Review of the MIP images confirms the above findings. IMPRESSION: 1. Acute segmental pulmonary embolism to the right middle lobe. 2. Mild atelectasis and trace pleural effusions. 3. New perinephric edema compared to 12/11/2015, particularly extensive on the right. Consider CT or ultrasound to evaluate for urinary obstruction given patient's current urologic disease. 4. Dilated ascending aorta measuring up to 44 mm. If clinically appropriate, recommend annual imaging followup by CTA or MRA. This recommendation follows 2010 ACCF/AHA/AATS/ACR/ASA/SCA/SCAI/SIR/STS/SVM Guidelines for the Diagnosis and Management of Patients with Thoracic Aortic Disease. Circulation. 2010; 121: F634192 Electronically Signed   By: Neva Seat.D.  On: 12/26/2015 22:54   Dg Chest Port 1 View  12/26/2015  CLINICAL DATA:  Hematuria EXAM: PORTABLE CHEST 1 VIEW COMPARISON:  06/15/2005 FINDINGS: Cardiomediastinal silhouette is stable. There is streaky left basilar atelectasis or infiltrate. No pulmonary edema. Mild degenerative changes thoracic spine. IMPRESSION: Streaky left basilar atelectasis or infiltrate.  No pulmonary edema. Electronically Signed   By: Lahoma Crocker M.D.   On: 12/26/2015 13:09    LINES:   CULTURES: 6/8 Blood Cx x 2 > GNR 6/7 Urine Cx >  ANTIBIOTICS  Vanco 6/8 >  Meropenem 6/6>  Levaquin 6/8-6/8  Zosyn 6/8-6/8  ASSESSMENT / PLAN: 82 year old  male past medical history of prostate cancer hypertension, arthritis history of radiation cystitis, recent admission for gross hematuria at which point Plavix was stopped, now with segmental right middle lobe PE and gross hematuria.  Right middle lobe segmental PE Tachypnea Dyspnea Urinary tract infection Gram-negative bacteremia  Plan: -CT chest reviewed, patient with right middle lobe segmental pulmonary embolus, without right heart strain. -Patient currently not a candidate for any type of anticoagulation given his gross hematuria and subsequent drop in hemoglobin -Hemoglobin trending down today, recommend transfusing 1 unit of packed red blood cells -Further workup of PE to include bilateral venous Doppler ultrasounds along with echocardiogram of heart -Agree with evaluation for IVC filter placement -Gram-negative bacteremia - continue with meropenem, check pro-calcitonin level, adjust antibiotics based on sensitivities -Overall patient with complicated medical history and fair to poor prognosis  Thank you for consulting [facility], Please feel free to contacts [place] with any questions at [phone] (please enter 7-digits).  I have personally obtained a history, examined the patient, evaluated laboratory and imaging results, formulated the assessment and plan and placed orders.  Pulmonary Care Time devoted to patient care services described in this note is 45 minutes.     Vilinda Boehringer, MD  Pulmonary and Critical Care Pager [phone] (please enter 7-digits) On Call Pager [phone] (please enter 7-digits)  Note: This note was prepared with Dragon dictation along with smaller phrase technology. Any transcriptional errors that result from this process are unintentional.

## 2015-12-27 NOTE — Progress Notes (Signed)
Dr Erlene Quan with urology was at bedside, per Dr. Erlene Quan, nursing to irrigate foley with 60 ml ns to remove clots.  Per Dr. Erlene Quan she will likely place pt back on  Bladder irrigation at lunch time.  Irrigated foley, clot evacuated.

## 2015-12-27 NOTE — Consult Note (Signed)
[facility] Vascular Consult Note  MRN : [id number]  Geoffrey Barber is a 82 y.o. (12/02/1924) male who presents with chief complaint of  Chief Complaint  Patient presents with  . Hematuria  .  History of Present Illness: I am asked to See this patient by Dr. Berline Lopes. The patient is a 82 y.o. male with a known history of Prostate cancer, urethral stricture who was discharged about a week ago for hematuria. Patient was discharged to skilled nursing facility and presents today with hematuria. He was readmitted 2 days ago secondary to increasing hematuria.  During last hospitalization patient had a CT scan which showed debris in the bladder which could be a mass or focal hemorrhage. Plavix was discontinued. Patient was seen by urology during that hospitalization. At discharge patient's hematuria had resolved.  In association with this patient has been experiencing increasing respiratory difficulties. Chest x-ray was obtained which suggested an early infiltrate and antibiotics were started. However, his respiratory status continued to deteriorate somewhat and a CT angiogram was done which demonstrates a pulmonary embolism. I am asked to evaluate the patient secondary to newly identified PE in association with significant hematuria.  Current Facility-Administered Medications  Medication Dose Route Frequency Provider Last Rate Last Dose  . acetaminophen (TYLENOL) tablet 650 mg  650 mg Oral Q6H PRN Bettey Costa, MD   650 mg at 12/26/15 2043  . amLODipine (NORVASC) tablet 5 mg  5 mg Oral Daily Bettey Costa, MD   5 mg at 12/26/15 1014  . ceFAZolin (ANCEF) IVPB 2g/100 mL premix  2 g Intravenous On Call to OR [name], MD      . cholecalciferol (VITAMIN D) tablet 1,000 Units  1,000 Units Oral Daily Bettey Costa, MD   1,000 Units at 12/26/15 1014  . dapsone tablet 25 mg  25 mg Oral QODAY Bettey Costa, MD   25 mg at 12/25/15 1858  . HYDROcodone-acetaminophen (NORCO/VICODIN) 5-325 MG  per tablet 1-2 tablet  1-2 tablet Oral Q4H PRN Bettey Costa, MD   2 tablet at 12/25/15 1041  . lisinopril (PRINIVIL,ZESTRIL) tablet 10 mg  10 mg Oral Daily Bettey Costa, MD   10 mg at 12/26/15 1012  . meropenem (MERREM) 2 g in sodium chloride 0.9 % 100 mL IVPB  2 g Intravenous Q8H Debby Crosley, MD   2 g at 12/27/15 0407  . morphine 2 MG/ML injection 2 mg  2 mg Intravenous Q6H PRN Nicholes Mango, MD      . multivitamin with minerals tablet 1 tablet  1 tablet Oral Daily Bettey Costa, MD   1 tablet at 12/26/15 2043  . multivitamin-lutein (OCUVITE-LUTEIN) capsule 1 capsule  1 capsule Oral BID Bettey Costa, MD   1 capsule at 12/26/15 2200  . ondansetron (ZOFRAN) tablet 4 mg  4 mg Oral Q6H PRN Bettey Costa, MD       Or  . ondansetron (ZOFRAN) injection 4 mg  4 mg Intravenous Q6H PRN Sital Mody, MD      . opium-belladonna (B&O SUPPRETTES) 16.2-60 MG suppository 1 suppository  1 suppository Rectal Q8H PRN Hollice Espy, MD      . pantoprazole (PROTONIX) EC tablet 40 mg  40 mg Oral Daily Bettey Costa, MD   40 mg at 12/26/15 1014  . psyllium (HYDROCIL/METAMUCIL) packet 1 packet  1 packet Oral Daily Bettey Costa, MD   1 packet at 12/26/15 1014  . senna-docusate (Senokot-S) tablet 1 tablet  1 tablet Oral QHS PRN Sital Mody,  MD      . terazosin (HYTRIN) capsule 5 mg  5 mg Oral QHS Bettey Costa, MD   5 mg at 12/26/15 2043  . vancomycin (VANCOCIN) 1,250 mg in sodium chloride 0.9 % 250 mL IVPB  1,250 mg Intravenous Q18H Loleta Dicker, RPH        Past Medical History  Diagnosis Date  . Cancer ([place])   . Hypertension   . Arthritis     History reviewed. No pertinent past surgical history.  Social History Social History  Substance Use Topics  . Smoking status: Former Research scientist (life sciences)  . Smokeless tobacco: None  . Alcohol Use: No    Family History History reviewed. No pertinent family history. No family history of porphyria, autoimmune disease or bleeding clotting disorders  No Known Allergies   REVIEW OF SYSTEMS  (Negative unless checked)  Constitutional: [] Weight loss  [] Fever  [] Chills Cardiac: [] Chest pain   [] Chest pressure   [] Palpitations   [x] Shortness of breath when laying flat   [x] Shortness of breath at rest   [x] Shortness of breath with exertion. Vascular:  [] Pain in legs with walking   [] Pain in legs at rest   [] Pain in legs when laying flat   [] Claudication   [] Pain in feet when walking  [] Pain in feet at rest  [] Pain in feet when laying flat   [] History of DVT   [] Phlebitis   [x] Swelling in legs   [] Varicose veins   [] Non-healing ulcers Pulmonary:   [] Uses home oxygen   [] Productive cough   [] Hemoptysis   [x] Wheeze  [x] COPD   [] Asthma Neurologic:  [] Dizziness  [] Blackouts   [] Seizures   [] History of stroke   [] History of TIA  [] Aphasia   [] Temporary blindness   [] Dysphagia   [] Weakness or numbness in arms   [] Weakness or numbness in legs Musculoskeletal:  [] Arthritis   [] Joint swelling   [] Joint pain   [] Low back pain Hematologic:  [] Easy bruising  [] Easy bleeding   [] Hypercoagulable state   [] Anemic  [] Hepatitis Gastrointestinal:  [] Blood in stool   [] Vomiting blood  [] Gastroesophageal reflux/heartburn   [] Difficulty swallowing. Genitourinary:  [] Chronic kidney disease   [] Difficult urination  [] Frequent urination  [] Burning with urination   [x] Blood in urine Skin:  [] Rashes   [] Ulcers   [] Wounds Psychological:  [] History of anxiety   []  History of major depression.    Physical Examination  Filed Vitals:   12/27/15 0800 12/27/15 0900 12/27/15 0903 12/27/15 1000  BP: 108/59 104/67  110/63  Pulse: 46 101 92 88  Temp: 99.3 F (37.4 C)     TempSrc: Axillary     Resp: 25 29 25  33  Height:      Weight:      SpO2: 99% 97% 97% 99%   Body mass index is 29.15 kg/(m^2).  Head: [place]/AT, No temporalis wasting. Prominent temp pulse not noted. Ear/Nose/Throat: Nares w/o erythema or drainage, oropharynx w/o obsrtuction, Mallampati score: Class II.  Dentition dentures.  Eyes: PERRLA, Sclera  nonicteric.  Neck: Supple, no nuchal rigidity.  No bruit or JVD.  Pulmonary:  Breath sounds equal bilaterally but diminished, + use of accessory muscles BiPAP in place. Rhonchi noted bilaterally Cardiac: RRR, normal S1, S2, no Murmurs, rubs or gallops. Vascular: 3+ lower extremity edema trace pedal pulses Gastrointestinal: soft, non-tender, non-distended.  Musculoskeletal: Moves all extremities.  No deformity or atrophy. No edema. Neurologic: CN 2-12 intact. Symmetrical.  Speech is fluent.  Psychiatric: Judgment intact, Mood & affect appropriate for pt's clinical situation.  Dermatologic: No rashes or ulcers noted.  No cellulitis or open wounds. Lymph : No Cervical,  or Inguinal lymphadenopathy.      CBC Lab Results  Component Value Date   WBC 13.6* 12/27/2015   HGB 8.0* 12/27/2015   HCT 24.3* 12/27/2015   MCV 86.2 12/27/2015   PLT 194 12/27/2015    BMET    Component Value Date/Time   NA 133* 12/27/2015 0659   NA 138 02/24/2012 0529   K 3.7 12/27/2015 0659   K 3.5 02/24/2012 0529   CL 103 12/27/2015 0659   CL 101 02/24/2012 0529   CO2 22 12/27/2015 0659   CO2 28 02/24/2012 0529   GLUCOSE 117* 12/27/2015 0659   GLUCOSE 100* 02/24/2012 0529   BUN 43* 12/27/2015 0659   BUN 18 02/24/2012 0529   CREATININE 1.33* 12/27/2015 0659   CREATININE 0.91 02/24/2012 0529   CALCIUM 8.0* 12/27/2015 0659   CALCIUM 8.4* 02/24/2012 0529   GFRNONAA 46* 12/27/2015 0659   GFRNONAA >60 02/24/2012 0529   GFRAA 53* 12/27/2015 0659   GFRAA >60 02/24/2012 0529   Estimated Creatinine Clearance: 45.6 mL/min (by C-G formula based on Cr of 1.33).  COAG Lab Results  Component Value Date   INR 1.14 12/11/2015    Radiology Duplex ultrasound bilateral lower extremities venous: No evidence of DVT either lower extremity.  CT angiogram of the chest: Acute segmental pulmonary embolism right middle lobe  Assessment/Plan  #Acute pulmonary embolism right middle lobe: Given patient's hematuria  is not a candidate for anticoagulation and therefore IVC filter is indicated to prevent lethal pulmonary embolism. Especially given the fact that his pulmonary status is so compromised this also supports placement of an IVC filter. The risks and benefits of been reviewed all questions of been answered patient and the patient's wife agree with proceeding.  #Shortness of breath secondary to sepsis from healthcare associated pneumonia Blood cultures, urine cultures and sputum culture and sensitivity is ordered. Chest x-ray with possible infiltrate Start him on IV Zosyn and vancomycin. Breathing treatments as needed will be provided. Continue oxygen Monitor WBC closely, WBC at 21.6 today  #. Hematuria: Secondary to radiation cystitis Urology is recommending manual bladder irrigation Monitor hemoglobin and hematocrit closely and transfuse as needed Follow-up in urology recommendations  #. Essential hypertension: Blood pressure is acceptable. Continue Norvasc, lisinopril and HCTZ  #. BPH: Continue Hytrin  #. GERD: Continue PPI   Schnier, Dolores Lory, MD  12/27/2015 10:06 AM

## 2015-12-27 NOTE — Progress Notes (Signed)
Pt was tachypnic. Pt denies pain or feeling short of breath. Prime doc paged for elevated temp of 100.6 and increased respirations. Pt to receive Tylenol for elevated temp and to have CT and ABGs drawn.

## 2015-12-27 NOTE — Op Note (Signed)
[facility]   OPERATIVE NOTE    PRE-OPERATIVE DIAGNOSIS: Pulmonary embolism in association with gross hematuria  POST-OPERATIVE DIAGNOSIS: Same  PROCEDURE: 1.   Ultrasound guidance for vascular access to the right common femoral vein 2.   Catheter placement into the inferior vena cava 3.   Inferior venacavogram 4.   Placement of a Celect IVC filter  SURGEON: Laurissa Cowper, Dolores Lory  ASSISTANT(S): None  ANESTHESIA: Conscious sedation was administered under my direct supervision. IV Versed plus fentanyl were utilized. Continuous ECG, pulse oximetry and blood pressure was monitored throughout the entire procedure. A total of 2 milligrams of Versed and 50 micrograms of fentanyl were utilized.  Conscious sedation was for a total of 30 minutes.  ESTIMATED BLOOD LOSS: minimal  FINDING(S): 1.  Patent IVC which measures 24.6 mm in diameter  SPECIMEN(S):  none  INDICATIONS:   Geoffrey Barber is a 82 y.o. y.o. male who presents with pulmonary embolism shortness of breath requiring BiPAP and gross hematuria.  Inferior vena cava filter is indicated for this reason.  Risks and benefits including filter thrombosis, migration, fracture, bleeding, and infection were all discussed.  We discussed that all IVC filters that we place can be removed if desired from the patient once the need for the filter has passed.    DESCRIPTION: After obtaining full informed written consent, the patient was brought back to the vascular suite. The skin was sterilely prepped and draped in a sterile surgical field was created. The right common femoral vein was accessed under direct ultrasound guidance without difficulty with a Seldinger needle and a J-wire was then placed. The dilator is passed over the wire and the delivery sheath was placed into the inferior vena cava.  Inferior venacavogram was performed. This demonstrated a patent IVC with the level of the renal veins at L2.  The filter was then  deployed into the inferior vena cava at the level of L2 just below the renal veins. The delivery sheath was then removed. Pressure was held. Sterile dressings were placed. The patient tolerated the procedure well and was taken to the recovery room in stable condition.  Interpretation: Patent IVC measuring 24.6 mm in diameter filter deployed with good orientation  COMPLICATIONS: None  CONDITION: Stable  [name], M.D.  12/27/2015, 11:38 AM

## 2015-12-27 NOTE — Progress Notes (Signed)
Pharmacy Antibiotic Note  Geoffrey Barber is a 82 y.o. male admitted on 12/25/2015 with sepsis.  Pharmacy has been consulted for meropenem dosing.  Plan: Lab called to report positive blood cultures, GNR in all four bottles, BCID in anaerobic bottle of 1 set so far showed E. Coli KPC not detected. Spoke with Dr. Claria Dice. Okay to discontinue levofloxacin and Zosyn and switch to meropenem. Start meropenem 2 gm IV Q8H.   Height: 6' (182.9 cm) Weight: 215 lb (97.523 kg) IBW/kg (Calculated) : 77.6  Temp (24hrs), Avg:99.2 F (37.3 C), Min:97.7 F (36.5 C), Max:100.6 F (38.1 C)   Recent Labs Lab 12/24/15 0837 12/25/15 0701 12/26/15 0449  WBC 5.3 9.9 21.6*  CREATININE 0.97 0.94 1.05    Estimated Creatinine Clearance: 57.7 mL/min (by C-G formula based on Cr of 1.05).    No Known Allergies   Thank you for allowing pharmacy to be a part of this patient's care.  Laural Benes, Pharm.D., BCPS Clinical Pharmacist 12/27/2015 2:47 AM

## 2015-12-27 NOTE — Progress Notes (Signed)
Notified Dr. Posey Pronto of pt hemoglobin 7.9, was previously 8.  No new orders at this time per Dr. Posey Pronto, continue to monitor.

## 2015-12-27 NOTE — Progress Notes (Signed)
Pt is alert and oriented, no complaints of pain.  NSR.  Pt off and on bipap to 3L [place].  Pt tolerating liquids, fair appetite.  No complications from IVC filter placement, PAD device removed and tegaderm and gauze dressing placed, no hematoma or bleeding noted and distal pulses 2+.  Foley was having to manually be irrigated, clots removed. Dr. Erlene Quan placed 24 french (3 lumen) foley, continuous bladder irrigation iniated, to keep urine light pink in color.  VSS, afebrile.

## 2015-12-27 NOTE — Progress Notes (Addendum)
[facility] [place] NAME: Geoffrey Barber    MR#:  [id number]  DATE OF BIRTH:  10-Apr-1925  SUBJECTIVE:  CHIEF COMPLAINT:  Patient Was short of breath last night and diagnosed with pulmonary embolism transferred to stepdown unit.seen and examined the patient and after IVC filter placement today Denies any chest pain or shortness of breath. Still having blood in his urine  REVIEW OF SYSTEMS:  CONSTITUTIONAL: No fever, fatigue or weakness.  EYES: No blurred or double vision.  EARS, NOSE, AND THROAT: No tinnitus or ear pain.  RESPIRATORY: No cough, Reporting shortness of breath, denies wheezing or hemoptysis.  CARDIOVASCULAR: No chest pain, orthopnea, edema.  GASTROINTESTINAL: No nausea, vomiting, diarrhea or abdominal pain.  GENITOURINARY: No dysuria, hematuria.  ENDOCRINE: No polyuria, nocturia,  HEMATOLOGY: No anemia, easy bruising or bleeding SKIN: No rash or lesion. MUSCULOSKELETAL: No joint pain or arthritis.   NEUROLOGIC: No tingling, numbness, weakness.  PSYCHIATRY: No anxiety or depression.   DRUG ALLERGIES:  No Known Allergies  VITALS:  Blood pressure 124/76, pulse 91, temperature 97.5 F (36.4 C), temperature source Oral, resp. rate 32, height 6' (1.829 m), weight 97.523 kg (215 lb), SpO2 98 %.  PHYSICAL EXAMINATION:  GENERAL:  82 y.o.-year-old patient lying in the bed with no acute distress.  EYES: Pupils equal, round, reactive to light and accommodation. No scleral icterus. Extraocular muscles intact.  HEENT: Head atraumatic, normocephalic. Oropharynx and nasopharynx clear.  NECK:  Supple, no jugular venous distention. No thyroid enlargement, no tenderness.  LUNGS:Moderate breath sounds bilaterally and diminished in the lower lung fields, no wheezing, rales,rhonchi or crepitation. No use of accessory muscles of respiration. On BiPAP  CARDIOVASCULAR: S1, S2 normal. No murmurs, rubs, or gallops.  ABDOMEN: Soft, nontender,  nondistended. Bowel sounds present. No organomegaly or mass. Foley catheter with bloody urine  EXTREMITIES: No pedal edema, cyanosis, or clubbing.  NEUROLOGIC: Cranial nerves II through XII are intact. Muscle strength 5/5 in all extremities. Sensation intact. Gait not checked.  PSYCHIATRIC: The patient is alert and oriented x 3.  SKIN: No obvious rash, lesion, or ulcer.    LABORATORY PANEL:   CBC  Recent Labs Lab 12/27/15 0659  WBC 13.6*  HGB 8.0*  HCT 24.3*  PLT 194   ------------------------------------------------------------------------------------------------------------------  Chemistries   Recent Labs Lab 12/24/15 0837  12/27/15 0659  NA 134*  < > 133*  K 3.9  < > 3.7  CL 104  < > 103  CO2 22  < > 22  GLUCOSE 113*  < > 117*  BUN 30*  < > 43*  CREATININE 0.97  < > 1.33*  CALCIUM 8.5*  < > 8.0*  AST 18  --   --   ALT 15*  --   --   ALKPHOS 131*  --   --   BILITOT 0.3  --   --   < > = values in this interval not displayed. ------------------------------------------------------------------------------------------------------------------  Cardiac Enzymes  Recent Labs Lab 12/27/15 1345  TROPONINI 0.03   ------------------------------------------------------------------------------------------------------------------  RADIOLOGY:  Ct Angio Chest Pe W/cm &/or Wo Cm  12/26/2015  CLINICAL DATA:  Sudden onset shortness of breath EXAM: CT ANGIOGRAPHY CHEST WITH CONTRAST TECHNIQUE: Multidetector CT imaging of the chest was performed using the standard protocol during bolus administration of intravenous contrast. Multiplanar CT image reconstructions and MIPs were obtained to evaluate the vascular anatomy. CONTRAST:  100 cc Isovue 370 intravenous COMPARISON:  None. FINDINGS: THORACIC INLET/BODY WALL: No acute  abnormality. MEDIASTINUM: Chronic cardiomegaly. No pericardial effusion. Atherosclerosis, including the coronary arteries. Dilated ascending aorta at 44 mm. No  evidence of acute aortic syndrome. CTA of the pulmonary arteries is intermittently distorted by respiratory motion. Segmental branching pulmonary artery filling defect to the lateral segment right middle lobe. No right heart strain. LUNG WINDOWS: Trace pleural effusions and mild basilar atelectasis. Mild bronchial airway collapse cyst with bronchomalacia. Negative for pneumonia or edema. No lung infarct. UPPER ABDOMEN: There is bilateral perinephric edema that is new from 12/11/2015, extensive on the right where there is also a hepatorenal fossa fluid. Patient admitted with gross hematuria and possible UTI. The upper pole collecting system seen on the left and there is no hydronephrosis. Collecting system not seen on the right. OSSEOUS: No acute fracture.  No suspicious lytic or blastic lesions. Critical Value/emergent results were called by telephone at the time of interpretation on 12/26/2015 at 10:38 pm to Dr. Quintella Baton , who verbally acknowledged these results. Review of the MIP images confirms the above findings. IMPRESSION: 1. Acute segmental pulmonary embolism to the right middle lobe. 2. Mild atelectasis and trace pleural effusions. 3. New perinephric edema compared to 12/11/2015, particularly extensive on the right. Consider CT or ultrasound to evaluate for urinary obstruction given patient's current urologic disease. 4. Dilated ascending aorta measuring up to 44 mm. If clinically appropriate, recommend annual imaging followup by CTA or MRA. This recommendation follows 2010 ACCF/AHA/AATS/ACR/ASA/SCA/SCAI/SIR/STS/SVM Guidelines for the Diagnosis and Management of Patients with Thoracic Aortic Disease. Circulation. 2010; [id number] Electronically Signed   By: Monte Fantasia M.D.   On: 12/26/2015 22:54   US Renal  12/27/2015  CLINICAL DATA:  Abnormal CT of the abdomen. EXAM: RENAL / URINARY TRACT ULTRASOUND COMPLETE COMPARISON:  None. FINDINGS: Right Kidney: Length: 11.2 cm. Within the inferior pole  of the right kidney there is a cyst measuring 6.1 x 5.7 x 6.0 cm. There is a well-circumscribed solid-appearing structure within the upper pole cortex of the right kidney measuring 1.8 x 2.0 x 1.7 cm. This is isoechoic to the adjacent renal parenchyma. Echogenicity within normal limits. No mass or hydronephrosis visualized. Left Kidney: Length: 11.0 cm. Echogenicity within normal limits. No mass or hydronephrosis visualized. Bladder: Appears normal for degree of bladder distention. IMPRESSION: 1. Indeterminate solid-appearing structure within the upper pole of the right kidney. Cannot rule out neoplasm. Further evaluation with nonemergent renal protocol MRI is recommended. 2. Right inferior pole cyst without complicating features. Electronically Signed   By: Kerby Moors M.D.   On: 12/27/2015 09:18   US Venous Img Lower Bilateral  12/27/2015  CLINICAL DATA:  History of pulmonary embolism and malignancy. Evaluate for DVT. EXAM: BILATERAL LOWER EXTREMITY VENOUS DOPPLER ULTRASOUND TECHNIQUE: Gray-scale sonography with graded compression, as well as color Doppler and duplex ultrasound were performed to evaluate the lower extremity deep venous systems from the level of the common femoral vein and including the common femoral, femoral, profunda femoral, popliteal and calf veins including the posterior tibial, peroneal and gastrocnemius veins when visible. The superficial great saphenous vein was also interrogated. Spectral Doppler was utilized to evaluate flow at rest and with distal augmentation maneuvers in the common femoral, femoral and popliteal veins. COMPARISON:  Bilateral lower extremity venous Doppler ultrasound - 02/24/2012 FINDINGS: RIGHT LOWER EXTREMITY Common Femoral Vein: No evidence of thrombus. Normal compressibility, respiratory phasicity and response to augmentation. Saphenofemoral Junction: No evidence of thrombus. Normal compressibility and flow on color Doppler imaging. Profunda Femoral Vein: No  evidence of thrombus.  Normal compressibility and flow on color Doppler imaging. Femoral Vein: No evidence of thrombus. Normal compressibility, respiratory phasicity and response to augmentation. Popliteal Vein: No evidence of thrombus. Normal compressibility, respiratory phasicity and response to augmentation. Calf Veins: No evidence of thrombus. Normal compressibility and flow on color Doppler imaging. Superficial Great Saphenous Vein: No evidence of thrombus. Normal compressibility and flow on color Doppler imaging. Venous Reflux:  None. Other Findings:  None. LEFT LOWER EXTREMITY Common Femoral Vein: No evidence of thrombus. Normal compressibility, respiratory phasicity and response to augmentation. Saphenofemoral Junction: No evidence of thrombus. Normal compressibility and flow on color Doppler imaging. Profunda Femoral Vein: No evidence of thrombus. Normal compressibility and flow on color Doppler imaging. Femoral Vein: No evidence of thrombus. Normal compressibility, respiratory phasicity and response to augmentation. Popliteal Vein: No evidence of thrombus. Normal compressibility, respiratory phasicity and response to augmentation. Calf Veins: No evidence of thrombus. Normal compressibility and flow on color Doppler imaging. Superficial Great Saphenous Vein: No evidence of thrombus. Normal compressibility and flow on color Doppler imaging. Venous Reflux:  None. Other Findings:  None. IMPRESSION: No evidence of DVT within either lower extremity. Electronically Signed   By: Sandi Mariscal M.D.   On: 12/27/2015 09:20   Dg Chest Port 1 View  12/26/2015  CLINICAL DATA:  Hematuria EXAM: PORTABLE CHEST 1 VIEW COMPARISON:  06/15/2005 FINDINGS: Cardiomediastinal silhouette is stable. There is streaky left basilar atelectasis or infiltrate. No pulmonary edema. Mild degenerative changes thoracic spine. IMPRESSION: Streaky left basilar atelectasis or infiltrate.  No pulmonary edema. Electronically Signed   By: Lahoma Crocker  M.D.   On: 12/26/2015 13:09    EKG:   Orders placed or performed in visit on 02/24/12  . 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   #Shortness of breath secondary to sepsis from healthcare associated pneumonia And pulmonary embolism  Pulmonary embolism cannot anticoagulates in view of hematuria, status post IVC filter placement :  Blood cultures with gram-negative rods  urine culture with greater than 100,000 colonies of Escherichia coli   sputum culture and sensitivity is ordered. Chest x-ray with possible infiltrate on IV Zosyn and vancomycin. Continue BiPAP  Appreciate pulmonology recommendations  Breathing treatments as needed will be provided. Continue oxygen Monitor WBC closely, WBC at 21.6 -13.6  #. 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 Essential hypertension: Blood pressure is acceptable. Continue Norvasc, lisinopril and HCTZ  #. BPH: Continue Hytrin  #. GERD: Continue PPI   Physical therapy is recommending skilled nursing care  All the records are reviewed and case discussed with Care Management/Social Workerr. Management plans discussed with the patient, family and they are in agreement.  CODE STATUS: fc  TOTAL critical TIME TAKING CARE OF THIS PATIENT: 39 minutes.   POSSIBLE D/C IN 2=3 DAYS, DEPENDING ON CLINICAL CONDITION.  Note: This dictation was prepared with Dragon dictation along with smaller phrase technology. Any transcriptional errors that result from this process are unintentional.   Nicholes Mango M.D on 12/27/2015 at 2:51 PM  Between 7am to 6pm - Pager - [phone] After 6pm go to [email or web address] - password [email or web address] [facility]  Office   [phone]  CC: Primary care physician; BABAOFF, Caryl Bis, MD

## 2015-12-27 NOTE — Progress Notes (Signed)
Pharmacy Antibiotic Note  Geoffrey Barber is a 82 y.o. male admitted on 12/25/2015 with pneumonia.  Pharmacy has been consulted for meropenem dosing.  Plan: Will continue meropenem 1g IV Q8hr.    Height: 6' (182.9 cm) Weight: 215 lb (97.523 kg) IBW/kg (Calculated) : 77.6  Temp (24hrs), Avg:98.8 F (37.1 C), Min:97.5 F (36.4 C), Max:100.6 F (38.1 C)   Recent Labs Lab 12/24/15 0837 12/25/15 0701 12/26/15 0449 12/27/15 0659  WBC 5.3 9.9 21.6* 13.6*  CREATININE 0.97 0.94 1.05 1.33*    Estimated Creatinine Clearance: 45.6 mL/min (by C-G formula based on Cr of 1.33).    No Known Allergies  Antimicrobials this admission: Dapsone >>  Zosyn >> 6/8 Levofloxacin 6/8 >> 6/9  Vancomycin 6/8 >> 6/9 Meropenem 6/9 >>  Microbiology results: 6/8 BCx: Gram Negative Rods  6/8 UCx: > 100K [place] will continue to monitor and adjust per consult.    Simpson,Michael L 12/27/2015 1:57 PM

## 2015-12-27 NOTE — Progress Notes (Signed)
Foley exchanged to 24 Fr hematuria 3- way Foley.  Hand irrigated for a few small clots.  Slow gtt CBI initiated.    Hollice Espy, MD

## 2015-12-27 NOTE — Progress Notes (Signed)
PT Cancellation Note  Patient Details Name: Geoffrey Barber MRN: [id number] DOB: 01/15/1925   Cancelled Treatment:    Reason Eval/Treat Not Completed: Medical issues which prohibited therapy Pt had IVC filter placed today (2/2 PE), not appropriate for PT today post procedure.  Kreg Shropshire, DPT 12/27/2015, 4:05 PM

## 2015-12-27 NOTE — Progress Notes (Signed)
*  PRELIMINARY RESULTS* Echocardiogram 2D Echocardiogram has been performed.  Geoffrey Barber 12/27/2015, 2:19 PM

## 2015-12-28 LAB — CBC
HCT: 22.3 % — ABNORMAL LOW (ref 40.0–52.0)
HEMOGLOBIN: 7.4 g/dL — AB (ref 13.0–18.0)
MCH: 28.3 pg (ref 26.0–34.0)
MCHC: 33.1 g/dL (ref 32.0–36.0)
MCV: 85.5 fL (ref 80.0–100.0)
Platelets: 175 10*3/uL (ref 150–440)
RBC: 2.61 MIL/uL — AB (ref 4.40–5.90)
RDW: 16.9 % — ABNORMAL HIGH (ref 11.5–14.5)
WBC: 7.7 10*3/uL (ref 3.8–10.6)

## 2015-12-28 LAB — HEMOGLOBIN AND HEMATOCRIT, BLOOD
HEMATOCRIT: 24 % — AB (ref 40.0–52.0)
HEMATOCRIT: 22.6 % — AB (ref 40.0–52.0)
HEMATOCRIT: 21.9 % — AB (ref 40.0–52.0)
HEMOGLOBIN: 7.6 g/dL — AB (ref 13.0–18.0)
Hemoglobin: 7.9 g/dL — ABNORMAL LOW (ref 13.0–18.0)
Hemoglobin: 7.2 g/dL — ABNORMAL LOW (ref 13.0–18.0)

## 2015-12-28 LAB — BASIC METABOLIC PANEL
Anion gap: 8 (ref 5–15)
BUN: 41 mg/dL — AB (ref 6–20)
CHLORIDE: 105 mmol/L (ref 101–111)
CO2: 22 mmol/L (ref 22–32)
Calcium: 7.9 mg/dL — ABNORMAL LOW (ref 8.9–10.3)
Creatinine, Ser: 1.34 mg/dL — ABNORMAL HIGH (ref 0.61–1.24)
GFR calc Af Amer: 52 mL/min — ABNORMAL LOW (ref >60)
GFR calc non Af Amer: 45 mL/min — ABNORMAL LOW (ref >60)
GLUCOSE: 140 mg/dL — AB (ref 65–99)
POTASSIUM: 3.5 mmol/L (ref 3.5–5.1)
Sodium: 135 mmol/L (ref 135–145)

## 2015-12-28 LAB — HEMOGLOBIN
HEMOGLOBIN: 7.8 g/dL — AB (ref 13.0–18.0)
Hemoglobin: 7.6 g/dL — ABNORMAL LOW (ref 13.0–18.0)

## 2015-12-28 LAB — URINE CULTURE: Culture: >=100000 — AB

## 2015-12-28 LAB — PROCALCITONIN: Procalcitonin: 27.58 ng/mL

## 2015-12-28 MED ORDER — CETYLPYRIDINIUM CHLORIDE 0.05 % MT LIQD
7.0000 mL | Freq: Two times a day (BID) | OROMUCOSAL | Status: DC
  Administered 2015-12-28 – 2016-01-01 (×9): 7 mL via OROMUCOSAL

## 2015-12-28 MED ORDER — IPRATROPIUM-ALBUTEROL 0.5-2.5 (3) MG/3ML IN SOLN
3.0000 mL | Freq: Three times a day (TID) | RESPIRATORY_TRACT | Status: DC
  Administered 2015-12-29 – 2015-12-31 (×7): 3 mL via RESPIRATORY_TRACT
  Filled 2015-12-28 (×7): qty 3

## 2015-12-28 NOTE — Consult Note (Signed)
PULMONARY / CRITICAL CARE MEDICINE   Name: Geoffrey Barber MRN: [id number] DOB: [birth year]    ADMISSION DATE:  12/25/2015 CONSULTING PHYSICIAN: Dr. Claria Dice  BRIEF HISTORY: [phone] with PMHx of hematuria 2/2 radiation for prostate caner, hx of urethral stricture, hypertension, arthritis, recent admission for gross hematuria found to have a 15mm mass in the bladder though to be a clot after a insertion of 21F coude, and he passed a void trial; plavix was stopped and he was advised to follow up with his urologist and [facility] PCP.    HPI:  82 y.o. male with a known history of Prostate cancer, urethral stricture who was discharged about a week ago for hematuria. Patient was discharged to skilled nursing facility and represented on 6/7 with hematuria.  Seen by urology, recommends intermittent had irrigation of the bladder and follow up Urine culture, had stable H/H. Started complaining of shortness of breath yesterday, CXR showed possible atelectasis vs infiltrate in the left base, he was started on empiric antibiotics for suspected HCAP.  He continued to have SOB with tachypnea and mildly elevated troponin of 0.6, at which point a CTA Chest was ordered and showed RML segmental pulmonary embolus.   Patient alert and awake, NAD .  STUDIES:  6/8 CTA Chest - trace pl effusions and mild bronchomalacia, no pneumonia, segmental filling defect to the lateral segment RML, no right heart strain.   SIGNIFICANT EVENTS: 5/24-5/27 - admitted for gross hematuria, clot in the bladder found, plavix stopped 6/7 - readmitted for gross hematuria, intermittent bladder irrigation 6/8- developed sob and tachypnea, CTA Chest with RML segmental PE, with no right heart strain  VITAL SIGNS: Temp:  [97.5 F (36.4 C)-101.3 F (38.5 C)] 98.7 F (37.1 C) (06/10 0700) Pulse Rate:  [63-106] 63 (06/10 0700) Resp:  [13-35] 19 (06/10 0700) BP: (91-171)/(51-82) 114/71 mmHg (06/10 0700) SpO2:  [95 %-99 %] 95 % (06/10 0700) FiO2 (%):   [30 %] 30 % (06/10 0200) HEMODYNAMICS:   VENTILATOR SETTINGS: Vent Mode:  [-]  FiO2 (%):  [30 %] 30 % INTAKE / OUTPUT:  Intake/Output Summary (Last 24 hours) at 12/28/15 0802 Last data filed at 12/28/15 0748  Gross per 24 hour  Intake  18330 ml  Output  23035 ml  Net  -4705 ml    Review of Systems  Constitutional: Negative for fever and chills.  Eyes: Negative for blurred vision.  Respiratory: Negative for cough, hemoptysis, sputum production, shortness of breath and wheezing.   Cardiovascular: Negative for chest pain, palpitations and orthopnea.  Gastrointestinal: Negative for heartburn.  Genitourinary: Positive for hematuria.  Musculoskeletal: Negative for myalgias.  Skin: Negative for rash.  Neurological: Negative for dizziness and headaches.  Endo/Heme/Allergies: Bruises/bleeds easily.  Psychiatric/Behavioral: Negative for depression.  All other systems reviewed and are negative.   Physical Exam  Constitutional: He is oriented to person, place, and time and well-developed, well-nourished, and in no distress.  HENT:  Head: Normocephalic and atraumatic.  Right Ear: External ear normal.  Left Ear: External ear normal.  Eyes: Conjunctivae and EOM are normal. Pupils are equal, round, and reactive to light.  Neck: Normal range of motion. Neck supple.  Cardiovascular: Normal rate, regular rhythm, normal heart sounds and intact distal pulses.   No murmur heard. Pulmonary/Chest: Effort normal. No respiratory distress. He has no wheezes. He has rales.  Mild tachypnea Shallow BS at the bases  Abdominal: Soft. Bowel sounds are normal.  Musculoskeletal: Normal range of motion. He exhibits no edema.  Neurological: He  is alert and oriented to person, place, and time. No cranial nerve deficit.  Skin: Skin is warm.  Psychiatric: Affect normal.  Nursing note and vitals reviewed.    LABS:  CBC  Recent Labs Lab 12/26/15 0449  12/27/15 0659 12/27/15 1817 12/27/15 2355  12/28/15 0423  WBC 21.6*  --  13.6*  --   --  7.7  HGB 8.4*  < > 8.0* 7.9* 7.6* 7.4*  HCT 25.2*  --  24.3*  --   --  22.3*  PLT 268  --  194  --   --  175  < > = values in this interval not displayed. Coag's No results for input(s): APTT, INR in the last 168 hours. BMET  Recent Labs Lab 12/26/15 0449 12/27/15 0659 12/28/15 0423  NA 133* 133* 135  K 3.9 3.7 3.5  CL 105 103 105  CO2 21* 22 22  BUN 38* 43* 41*  CREATININE 1.05 1.33* 1.34*  GLUCOSE 112* 117* 140*   Electrolytes  Recent Labs Lab 12/26/15 0449 12/27/15 0659 12/28/15 0423  CALCIUM 8.1* 8.0* 7.9*   Sepsis Markers  Recent Labs Lab 12/27/15 0659 12/28/15 0423  PROCALCITON 43.10 27.58   ABG  Recent Labs Lab 12/26/15 2100  PHART 7.48*  PCO2ART 31*  PO2ART 92   Liver Enzymes  Recent Labs Lab 12/24/15 0837  AST 18  ALT 15*  ALKPHOS 131*  BILITOT 0.3  ALBUMIN 3.4*   Cardiac Enzymes  Recent Labs Lab 12/27/15 0659 12/27/15 1345 12/27/15 1817  TROPONINI 0.04* 0.03 0.04*   Glucose  Recent Labs Lab 12/27/15 0156  GLUCAP 180*    Imaging US Renal  12/27/2015  CLINICAL DATA:  Abnormal CT of the abdomen. EXAM: RENAL / URINARY TRACT ULTRASOUND COMPLETE COMPARISON:  None. FINDINGS: Right Kidney: Length: 11.2 cm. Within the inferior pole of the right kidney there is a cyst measuring 6.1 x 5.7 x 6.0 cm. There is a well-circumscribed solid-appearing structure within the upper pole cortex of the right kidney measuring 1.8 x 2.0 x 1.7 cm. This is isoechoic to the adjacent renal parenchyma. Echogenicity within normal limits. No mass or hydronephrosis visualized. Left Kidney: Length: 11.0 cm. Echogenicity within normal limits. No mass or hydronephrosis visualized. Bladder: Appears normal for degree of bladder distention. IMPRESSION: 1. Indeterminate solid-appearing structure within the upper pole of the right kidney. Cannot rule out neoplasm. Further evaluation with nonemergent renal protocol MRI is  recommended. 2. Right inferior pole cyst without complicating features. Electronically Signed   By: Kerby Moors M.D.   On: 12/27/2015 09:18   US Venous Img Lower Bilateral  12/27/2015  CLINICAL DATA:  History of pulmonary embolism and malignancy. Evaluate for DVT. EXAM: BILATERAL LOWER EXTREMITY VENOUS DOPPLER ULTRASOUND TECHNIQUE: Gray-scale sonography with graded compression, as well as color Doppler and duplex ultrasound were performed to evaluate the lower extremity deep venous systems from the level of the common femoral vein and including the common femoral, femoral, profunda femoral, popliteal and calf veins including the posterior tibial, peroneal and gastrocnemius veins when visible. The superficial great saphenous vein was also interrogated. Spectral Doppler was utilized to evaluate flow at rest and with distal augmentation maneuvers in the common femoral, femoral and popliteal veins. COMPARISON:  Bilateral lower extremity venous Doppler ultrasound - 02/24/2012 FINDINGS: RIGHT LOWER EXTREMITY Common Femoral Vein: No evidence of thrombus. Normal compressibility, respiratory phasicity and response to augmentation. Saphenofemoral Junction: No evidence of thrombus. Normal compressibility and flow on color Doppler imaging. Profunda Femoral Vein: No  evidence of thrombus. Normal compressibility and flow on color Doppler imaging. Femoral Vein: No evidence of thrombus. Normal compressibility, respiratory phasicity and response to augmentation. Popliteal Vein: No evidence of thrombus. Normal compressibility, respiratory phasicity and response to augmentation. Calf Veins: No evidence of thrombus. Normal compressibility and flow on color Doppler imaging. Superficial Great Saphenous Vein: No evidence of thrombus. Normal compressibility and flow on color Doppler imaging. Venous Reflux:  None. Other Findings:  None. LEFT LOWER EXTREMITY Common Femoral Vein: No evidence of thrombus. Normal compressibility,  respiratory phasicity and response to augmentation. Saphenofemoral Junction: No evidence of thrombus. Normal compressibility and flow on color Doppler imaging. Profunda Femoral Vein: No evidence of thrombus. Normal compressibility and flow on color Doppler imaging. Femoral Vein: No evidence of thrombus. Normal compressibility, respiratory phasicity and response to augmentation. Popliteal Vein: No evidence of thrombus. Normal compressibility, respiratory phasicity and response to augmentation. Calf Veins: No evidence of thrombus. Normal compressibility and flow on color Doppler imaging. Superficial Great Saphenous Vein: No evidence of thrombus. Normal compressibility and flow on color Doppler imaging. Venous Reflux:  None. Other Findings:  None. IMPRESSION: No evidence of DVT within either lower extremity. Electronically Signed   By: Sandi Mariscal M.D.   On: 12/27/2015 09:20    LINES:   CULTURES: 6/8 Blood Cx x 2 > GNR E coli/enterobacter species 6/7 Urine Cx >  ANTIBIOTICS  Vanco 6/8 >  Meropenem 6/6>  Levaquin 6/8-6/8  Zosyn 6/8-6/8  ASSESSMENT / PLAN: 82 year old male past medical history of prostate cancer hypertension, arthritis history of radiation cystitis, recent admission for gross hematuria at which point Plavix was stopped, now with segmental right middle lobe PE and gross hematuria.  Right middle lobe segmental PE Tachypnea Dyspnea Urinary tract infection E coli/eneterobacter species bacteremia  Plan: -CT chest reviewed, patient with right middle lobe segmental pulmonary embolus, without right heart strain. -Patient currently not a candidate for any type of anticoagulation given his gross hematuria and subsequent drop in hemoglobin -Hemoglobin trending down today, recommend transfusing 1 unit of packed red blood cells if less than 7 -Further workup of PE to include bilateral venous Doppler ultrasounds NEG for DVT along with echocardiogram of heart -E coli/enterobacter species  bacteremia - continue with meropenem,  -Overall patient with complicated medical history and poor prognosis -remains step down status   I have personally obtained a history, examined the patient, evaluated Pertinent laboratory and RadioGraphic/imaging results, and  formulated the assessment and plan   The Patient requires high complexity decision making for assessment and support, frequent evaluation and titration of therapies, application of advanced monitoring technologies and extensive interpretation of multiple databases.  Overall, patient is critically ill, prognosis is guarded.     Corrin Parker, M.D.  [facility]  Medical Director [place] Director [facility] Cardio-Pulmonary Department

## 2015-12-28 NOTE — Progress Notes (Signed)
Pt. A & O x4, had Bipap on O/N with the exception of a few hours @ 30%.  Only complaints were intermittent bladder pressure.  Irrigated catheter by hand x 2 to dislodge clots.  CBI continuing. Pt. Had temp @ 101.4 @ 2000 gave tylenol, temperature came down throughout evening. Pt.'s Hgb trending down, current: 7.4. Giving report to Katharine Look, Therapist, sports.

## 2015-12-28 NOTE — Progress Notes (Signed)
Paged and spoke w/ Dr. Erlene Quan r/t pt.'s 6/10 pain in lower abdomen- not relieved w/ PRN medication & leakage from catheter. Instructed to irrigate by hand with 60 mL of NS. Irrigated and aspirated back 60 mL x3 to remove clots obstructing the catheter, will continue to monitor pt. Closely.

## 2015-12-28 NOTE — Progress Notes (Signed)
Pharmacy Antibiotic Note  Geoffrey Barber is a 82 y.o. male admitted on 12/25/2015 with pneumonia.  Pharmacy has been consulted for meropenem dosing.  Plan: Will continue meropenem 1g IV Q8hr.    Height: 6' (182.9 cm) Weight: 215 lb (97.523 kg) IBW/kg (Calculated) : 77.6  Temp (24hrs), Avg:99.1 F (37.3 C), Min:98 F (36.7 C), Max:101.3 F (38.5 C)   Recent Labs Lab 12/24/15 0837 12/25/15 0701 12/26/15 0449 12/27/15 0659 12/28/15 0423  WBC 5.3 9.9 21.6* 13.6* 7.7  CREATININE 0.97 0.94 1.05 1.33* 1.34*    Estimated Creatinine Clearance: 45.2 mL/min (by C-G formula based on Cr of 1.34).    No Known Allergies  Antimicrobials this admission: Dapsone >>  Zosyn >> 6/8 Levofloxacin 6/8 >> 6/9  Vancomycin 6/8 >> 6/9 Meropenem 6/9 >>  Microbiology results: 6/8 BCx: Gram Negative Rods  6/8 UCx: > 100K [place] will continue to monitor and adjust per consult.    Olivia Canter, [place] Clinical Pharmacist 12/28/2015 1:27 PM

## 2015-12-28 NOTE — Progress Notes (Signed)
[facility] [place] NAME: Geoffrey Barber    MR#:  [id number]  DATE OF BIRTH:  03/31/1925  SUBJECTIVE:  CHIEF COMPLAINT:  Patient Is resting comfortably, had IVC filter placement yesterday, shortness of breath significantly improved. Febrile last night temperature 101.3. Still having hematuria with clots  REVIEW OF SYSTEMS:  CONSTITUTIONAL: No fever, fatigue or weakness.  EYES: No blurred or double vision.  EARS, NOSE, AND THROAT: No tinnitus or ear pain.  RESPIRATORY: No cough, shortness of breath, denies wheezing or hemoptysis.  CARDIOVASCULAR: No chest pain, orthopnea, edema.  GASTROINTESTINAL: No nausea, vomiting, diarrhea or abdominal pain.  GENITOURINARY: No dysuria,Has hematuria.  ENDOCRINE: No polyuria, nocturia,  HEMATOLOGY: No anemia, easy bruising or bleeding SKIN: No rash or lesion. MUSCULOSKELETAL: No joint pain or arthritis.   NEUROLOGIC: No tingling, numbness, weakness.  PSYCHIATRY: No anxiety or depression.   DRUG ALLERGIES:  No Known Allergies  VITALS:  Blood pressure 114/71, pulse 63, temperature 98.7 F (37.1 C), temperature source Oral, resp. rate 19, height 6' (1.829 m), weight 97.523 kg (215 lb), SpO2 95 %.  PHYSICAL EXAMINATION:  GENERAL:  82 y.o.-year-old patient lying in the bed with no acute distress.  EYES: Pupils equal, round, reactive to light and accommodation. No scleral icterus. Extraocular muscles intact.  HEENT: Head atraumatic, normocephalic. Oropharynx and nasopharynx clear.  NECK:  Supple, no jugular venous distention. No thyroid enlargement, no tenderness.  LUNGS:Moderate breath sounds bilaterally and diminished in the lower lung fields, no wheezing, rales,rhonchi or crepitation. No use of accessory muscles of respiration. Off BiPAP  CARDIOVASCULAR: S1, S2 normal. No murmurs, rubs, or gallops.  ABDOMEN: Soft, nontender, nondistended. Bowel sounds present. No organomegaly or mass. Foley  catheter with bloody urine  EXTREMITIES: No pedal edema, cyanosis, or clubbing.  NEUROLOGIC: Cranial nerves II through XII are intact. Muscle strength 5/5 in all extremities. Sensation intact. Gait not checked.  PSYCHIATRIC: The patient is alert and oriented x 3.  SKIN: No obvious rash, lesion, or ulcer.    LABORATORY PANEL:   CBC  Recent Labs Lab 12/28/15 0423 12/28/15 0819  WBC 7.7  --   HGB 7.4* 7.8*  HCT 22.3*  --   PLT 175  --    ------------------------------------------------------------------------------------------------------------------  Chemistries   Recent Labs Lab 12/24/15 0837  12/28/15 0423  NA 134*  < > 135  K 3.9  < > 3.5  CL 104  < > 105  CO2 22  < > 22  GLUCOSE 113*  < > 140*  BUN 30*  < > 41*  CREATININE 0.97  < > 1.34*  CALCIUM 8.5*  < > 7.9*  AST 18  --   --   ALT 15*  --   --   ALKPHOS 131*  --   --   BILITOT 0.3  --   --   < > = values in this interval not displayed. ------------------------------------------------------------------------------------------------------------------  Cardiac Enzymes  Recent Labs Lab 12/27/15 1817  TROPONINI 0.04*   ------------------------------------------------------------------------------------------------------------------  RADIOLOGY:  Ct Angio Chest Pe W/cm &/or Wo Cm  12/26/2015  CLINICAL DATA:  Sudden onset shortness of breath EXAM: CT ANGIOGRAPHY CHEST WITH CONTRAST TECHNIQUE: Multidetector CT imaging of the chest was performed using the standard protocol during bolus administration of intravenous contrast. Multiplanar CT image reconstructions and MIPs were obtained to evaluate the vascular anatomy. CONTRAST:  100 cc Isovue 370 intravenous COMPARISON:  None. FINDINGS: THORACIC INLET/BODY WALL: No acute abnormality. MEDIASTINUM: Chronic cardiomegaly. No  pericardial effusion. Atherosclerosis, including the coronary arteries. Dilated ascending aorta at 44 mm. No evidence of acute aortic syndrome. CTA of  the pulmonary arteries is intermittently distorted by respiratory motion. Segmental branching pulmonary artery filling defect to the lateral segment right middle lobe. No right heart strain. LUNG WINDOWS: Trace pleural effusions and mild basilar atelectasis. Mild bronchial airway collapse cyst with bronchomalacia. Negative for pneumonia or edema. No lung infarct. UPPER ABDOMEN: There is bilateral perinephric edema that is new from 12/11/2015, extensive on the right where there is also a hepatorenal fossa fluid. Patient admitted with gross hematuria and possible UTI. The upper pole collecting system seen on the left and there is no hydronephrosis. Collecting system not seen on the right. OSSEOUS: No acute fracture.  No suspicious lytic or blastic lesions. Critical Value/emergent results were called by telephone at the time of interpretation on 12/26/2015 at 10:38 pm to Dr. Quintella Baton , who verbally acknowledged these results. Review of the MIP images confirms the above findings. IMPRESSION: 1. Acute segmental pulmonary embolism to the right middle lobe. 2. Mild atelectasis and trace pleural effusions. 3. New perinephric edema compared to 12/11/2015, particularly extensive on the right. Consider CT or ultrasound to evaluate for urinary obstruction given patient's current urologic disease. 4. Dilated ascending aorta measuring up to 44 mm. If clinically appropriate, recommend annual imaging followup by CTA or MRA. This recommendation follows 2010 ACCF/AHA/AATS/ACR/ASA/SCA/SCAI/SIR/STS/SVM Guidelines for the Diagnosis and Management of Patients with Thoracic Aortic Disease. Circulation. 2010; [id number] Electronically Signed   By: Monte Fantasia M.D.   On: 12/26/2015 22:54   US Renal  12/27/2015  CLINICAL DATA:  Abnormal CT of the abdomen. EXAM: RENAL / URINARY TRACT ULTRASOUND COMPLETE COMPARISON:  None. FINDINGS: Right Kidney: Length: 11.2 cm. Within the inferior pole of the right kidney there is a cyst  measuring 6.1 x 5.7 x 6.0 cm. There is a well-circumscribed solid-appearing structure within the upper pole cortex of the right kidney measuring 1.8 x 2.0 x 1.7 cm. This is isoechoic to the adjacent renal parenchyma. Echogenicity within normal limits. No mass or hydronephrosis visualized. Left Kidney: Length: 11.0 cm. Echogenicity within normal limits. No mass or hydronephrosis visualized. Bladder: Appears normal for degree of bladder distention. IMPRESSION: 1. Indeterminate solid-appearing structure within the upper pole of the right kidney. Cannot rule out neoplasm. Further evaluation with nonemergent renal protocol MRI is recommended. 2. Right inferior pole cyst without complicating features. Electronically Signed   By: Kerby Moors M.D.   On: 12/27/2015 09:18   US Venous Img Lower Bilateral  12/27/2015  CLINICAL DATA:  History of pulmonary embolism and malignancy. Evaluate for DVT. EXAM: BILATERAL LOWER EXTREMITY VENOUS DOPPLER ULTRASOUND TECHNIQUE: Gray-scale sonography with graded compression, as well as color Doppler and duplex ultrasound were performed to evaluate the lower extremity deep venous systems from the level of the common femoral vein and including the common femoral, femoral, profunda femoral, popliteal and calf veins including the posterior tibial, peroneal and gastrocnemius veins when visible. The superficial great saphenous vein was also interrogated. Spectral Doppler was utilized to evaluate flow at rest and with distal augmentation maneuvers in the common femoral, femoral and popliteal veins. COMPARISON:  Bilateral lower extremity venous Doppler ultrasound - 02/24/2012 FINDINGS: RIGHT LOWER EXTREMITY Common Femoral Vein: No evidence of thrombus. Normal compressibility, respiratory phasicity and response to augmentation. Saphenofemoral Junction: No evidence of thrombus. Normal compressibility and flow on color Doppler imaging. Profunda Femoral Vein: No evidence of thrombus. Normal  compressibility and flow on  color Doppler imaging. Femoral Vein: No evidence of thrombus. Normal compressibility, respiratory phasicity and response to augmentation. Popliteal Vein: No evidence of thrombus. Normal compressibility, respiratory phasicity and response to augmentation. Calf Veins: No evidence of thrombus. Normal compressibility and flow on color Doppler imaging. Superficial Great Saphenous Vein: No evidence of thrombus. Normal compressibility and flow on color Doppler imaging. Venous Reflux:  None. Other Findings:  None. LEFT LOWER EXTREMITY Common Femoral Vein: No evidence of thrombus. Normal compressibility, respiratory phasicity and response to augmentation. Saphenofemoral Junction: No evidence of thrombus. Normal compressibility and flow on color Doppler imaging. Profunda Femoral Vein: No evidence of thrombus. Normal compressibility and flow on color Doppler imaging. Femoral Vein: No evidence of thrombus. Normal compressibility, respiratory phasicity and response to augmentation. Popliteal Vein: No evidence of thrombus. Normal compressibility, respiratory phasicity and response to augmentation. Calf Veins: No evidence of thrombus. Normal compressibility and flow on color Doppler imaging. Superficial Great Saphenous Vein: No evidence of thrombus. Normal compressibility and flow on color Doppler imaging. Venous Reflux:  None. Other Findings:  None. IMPRESSION: No evidence of DVT within either lower extremity. Electronically Signed   By: Sandi Mariscal M.D.   On: 12/27/2015 09:20   Dg Chest Port 1 View  12/26/2015  CLINICAL DATA:  Hematuria EXAM: PORTABLE CHEST 1 VIEW COMPARISON:  06/15/2005 FINDINGS: Cardiomediastinal silhouette is stable. There is streaky left basilar atelectasis or infiltrate. No pulmonary edema. Mild degenerative changes thoracic spine. IMPRESSION: Streaky left basilar atelectasis or infiltrate.  No pulmonary edema. Electronically Signed   By: Lahoma Crocker M.D.   On: 12/26/2015 13:09     EKG:   Orders placed or performed in visit on 02/24/12  . 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   #Shortness of breath secondary to sepsis from healthcare associated pneumonia And pulmonary embolism  Pulmonary embolism cannot anticoagulates in view of hematuria, status post IVC filter placement : DVT ruled out in the lower extremities   sputum culture and sensitivity is ordered. Chest x-ray with possible infiltrate Patient was on IV Zosyn and vancomycin. Antibiotics changed to meropenem Continue BiPAP as needed . Currently not on BiPAP Appreciate pulmonology recommendations  Breathing treatments as needed will be provided. Continue oxygen Monitor WBC closely, WBC at 21.6 -13.6-7.7  #Sepsis and positive blood cultures and urine cultures Blood cultures are positive with Escherichia coli and Enterobacter, sensitivities pending Urine cultures are positive with Escherichia coli pansensitive Continue meropenem  #Acute cystitis E coli Pansensitive on meropenem  #. Hematuria: Secondary to radiation cystitis Urology is recommending CBD  Monitor hemoglobin and hematocrit closely and transfuse as needed.hemoglobin 7.6-8.0-7.9, 7.6, 7.4, 7.8 Follow-up in urology recommendations  #. Essential hypertension: Blood pressure is acceptable. Continue Norvasc, lisinopril and HCTZ  #. BPH: Continue Hytrin  #. GERD: Continue PPI   Physical therapy is recommending skilled nursing care  All the records are reviewed and case discussed with Care Management/Social Workerr. Management plans discussed with the patient, family and they are in agreement.  CODE STATUS: fc  TOTAL critical TIME TAKING CARE OF THIS PATIENT: 39 minutes.   POSSIBLE D/C IN 2=3 DAYS, DEPENDING ON CLINICAL CONDITION.  Note: This dictation was prepared with Dragon dictation along with  smaller phrase technology. Any transcriptional errors that result from this process are unintentional.   Nicholes Mango M.D on 12/28/2015 at 10:02 AM  Between 7am to 6pm -  Pager - [phone] After 6pm go to [email or web address] - password [email or web address] [facility]  Office  [phone]  CC: Primary care physician; BABAOFF, Caryl Bis, MD

## 2015-12-29 LAB — CBC WITH DIFFERENTIAL/PLATELET
BASOS ABS: 0 10*3/uL (ref 0–0.1)
BASOS PCT: 0 %
EOS ABS: 0.1 10*3/uL (ref 0–0.7)
Eosinophils Relative: 2 %
HEMATOCRIT: 21.4 % — AB (ref 40.0–52.0)
HEMOGLOBIN: 7.2 g/dL — AB (ref 13.0–18.0)
Lymphocytes Relative: 11 %
Lymphs Abs: 0.7 10*3/uL — ABNORMAL LOW (ref 1.0–3.6)
MCH: 27.8 pg (ref 26.0–34.0)
MCHC: 33.5 g/dL (ref 32.0–36.0)
MCV: 83.2 fL (ref 80.0–100.0)
Monocytes Absolute: 0.7 10*3/uL (ref 0.2–1.0)
Monocytes Relative: 10 %
NEUTROS ABS: 5.3 10*3/uL (ref 1.4–6.5)
NEUTROS PCT: 77 %
Platelets: 195 10*3/uL (ref 150–440)
RBC: 2.57 MIL/uL — AB (ref 4.40–5.90)
RDW: 16.9 % — ABNORMAL HIGH (ref 11.5–14.5)
WBC: 6.8 10*3/uL (ref 3.8–10.6)

## 2015-12-29 LAB — BASIC METABOLIC PANEL
ANION GAP: 7 (ref 5–15)
BUN: 39 mg/dL — ABNORMAL HIGH (ref 6–20)
CHLORIDE: 107 mmol/L (ref 101–111)
CO2: 21 mmol/L — AB (ref 22–32)
Calcium: 7.8 mg/dL — ABNORMAL LOW (ref 8.9–10.3)
Creatinine, Ser: 1.09 mg/dL (ref 0.61–1.24)
GFR calc non Af Amer: 58 mL/min — ABNORMAL LOW (ref >60)
Glucose, Bld: 109 mg/dL — ABNORMAL HIGH (ref 65–99)
Potassium: 3.7 mmol/L (ref 3.5–5.1)
SODIUM: 135 mmol/L (ref 135–145)

## 2015-12-29 LAB — HEMOGLOBIN
HEMOGLOBIN: 7.4 g/dL — AB (ref 13.0–18.0)
HEMOGLOBIN: 7.5 g/dL — AB (ref 13.0–18.0)
HEMOGLOBIN: 7.6 g/dL — AB (ref 13.0–18.0)

## 2015-12-29 LAB — CULTURE, BLOOD (ROUTINE X 2)

## 2015-12-29 LAB — PROCALCITONIN: PROCALCITONIN: 13.85 ng/mL

## 2015-12-29 MED ORDER — DEXTROSE 5 % IV SOLN
1.0000 g | INTRAVENOUS | Status: DC
  Administered 2015-12-29: 16:00:00 1 g via INTRAVENOUS
  Filled 2015-12-29 (×2): qty 10

## 2015-12-29 NOTE — Progress Notes (Addendum)
Subjective: Geoffrey Barber is a 82 y.o. male patient with a history of prostate cancer who urology was consulted on for gross hematuria due to radiation cystitis. He had a 24 Fr. Rusch catheter placed for CBI and has been on a slow drip. Nursing and his wife report a few clots when irrigated but otherwise no major issue with the foley. No recent fevers. Hct stable.   Objective: Filed Vitals:   12/29/15 0900 12/29/15 1003  BP: 120/65 128/55  Pulse: 85 83  Temp:  99.4 F (37.4 C)  Resp: 36 20    General: well-developed male, in no acute distress HEENT: Atraumatic, moist membranes Neck: supple, trachea midline Lungs: moving air without restriction and without use of accessory muscles Cardiac: Regular rate Abd: Soft, nontender, nondistended GU: 24 Fr. rusch cath in place with in-flowing saline. Efflux is slightly pink tinged.  Ext: No edema Neuro: CN grossly intact. Moves all extremities   Lab Results  Component Value Date        CREATININE 1.34* 12/28/2015   CREATININE 1.33* 12/27/2015   Hbg was 7.6, 7.4, and 7.8 - stable  Procedure: Under clean conditions, the 3-way catheter was hand irrigated with 120 cc of saline only to find one small clot. The catheter was placed on CBI again at a slow rate with a clear outflow.   Allergies: No Known Allergies   Assessment/Plan: 82 y.o. male with Gross hematuria due to radiation cystitis. He is doing well on CBI.  Only minimal clots. The rate is slow and thus will recommend continuing to wean CBI.  PRN irrigation of clots. No acute need for cystoscopy or urological surgery.    35 minutes were spent with the patient and his spouse in the ICU room and in coordination of care.    Aviva Signs, MD

## 2015-12-29 NOTE — Progress Notes (Signed)
Subjective: Geoffrey Barber is a 82 y.o. male patient with a history of prostate cancer who urology was consulted on for gross hematuria due to radiation cystitis. He had a 24 Fr. Rusch catheter placed for CBI and has been on a slow drip.  Patient denies any major issues with the catheter. Nursing reports some minimal clots.   Objective: Filed Vitals:   12/29/15 1003 12/29/15 1429  BP: 128/55 115/59  Pulse: 83 78  Temp: 99.4 F (37.4 C) 99 F (37.2 C)  Resp: 20 20    General: well-developed male, in no acute distress HEENT: Atraumatic, moist membranes Neck: supple, trachea midline Lungs: moving air without restriction and without use of accessory muscles Cardiac: Regular rate Abd: Soft, nontender, nondistended GU: 24 Fr. rusch cath in place with in-flowing saline. Efflux is slightly pink tinged, see through, improved on a slow drip  Ext: No edema Neuro: CN grossly intact. Moves all extremities  Lab Results  Component Value Date   CREATININE 1.09 12/29/2015   CREATININE 1.34* 12/28/2015   CREATININE 1.33* 12/27/2015     CBC Latest Ref Rng 12/29/2015 12/29/2015 12/29/2015  WBC 3.8 - 10.6 K/uL - - 6.8  Hemoglobin 13.0 - 18.0 g/dL 7.6(L) 7.5(L) 7.2(L)  Hematocrit 40.0 - 52.0 % - - 21.4(L)  Platelets 150 - 440 K/uL - - 195   Allergies: No Known Allergies   Assessment/Plan: 82 y.o. male with Gross hematuria due to radiation cystitis. He is doing well on CBI.  Only minimal clots. The rate is slow and thus will recommend continuing to wean CBI.  PRN irrigation of clots. No acute need for cystoscopy or urological surgery.   If the rate remains slow, will consider removal of catheter tomorrow vs. Clamping off CBI.   25 minutes were spent in face to face interaction.   Aviva Signs, MD

## 2015-12-29 NOTE — Consult Note (Signed)
PULMONARY / CRITICAL CARE MEDICINE   Name: Geoffrey Barber MRN: [id number] DOB: [birth year]    ADMISSION DATE:  12/25/2015 CONSULTING PHYSICIAN: Dr. Claria Dice  BRIEF HISTORY: [phone] with PMHx of hematuria 2/2 radiation for prostate caner, hx of urethral stricture, hypertension, arthritis, recent admission for gross hematuria found to have a 21mm mass in the bladder though to be a clot after a insertion of 25F coude, and he passed a void trial; plavix was stopped and he was advised to follow up with his urologist and [place] PCP.    HPI:  82 y.o. male with a known history of Prostate cancer, urethral stricture who was discharged about a week ago for hematuria. Patient was discharged to skilled nursing facility and represented on 6/7 with hematuria.  Seen by urology, recommends intermittent had irrigation of the bladder and follow up Urine culture, had stable H/H. Started complaining of shortness of breath yesterday, CXR showed possible atelectasis vs infiltrate in the left base, he was started on empiric antibiotics for suspected HCAP.  He continued to have SOB with tachypnea and mildly elevated troponin of 0.6, at which point a CTA Chest was ordered and showed RML segmental pulmonary embolus. RESP DISTRESS HAS IMPROVED,   Patient alert and awake, NAD, BIPAP AT NIGHT .  STUDIES:  6/8 CTA Chest - trace pl effusions and mild bronchomalacia, no pneumonia, segmental filling defect to the lateral segment RML, no right heart strain.   SIGNIFICANT EVENTS: 5/24-5/27 - admitted for gross hematuria, clot in the bladder found, plavix stopped 6/7 - readmitted for gross hematuria, intermittent bladder irrigation 6/8- developed sob and tachypnea, CTA Chest with RML segmental PE, with no right heart strain  VITAL SIGNS: Temp:  [97.6 F (36.4 C)-99.7 F (37.6 C)] 99.4 F (37.4 C) (06/11 1003) Pulse Rate:  [75-106] 83 (06/11 1003) Resp:  [16-36] 20 (06/11 1003) BP: (85-128)/(55-73) 128/55 mmHg (06/11 1003) SpO2:   [92 %-99 %] 98 % (06/11 1003) HEMODYNAMICS:   VENTILATOR SETTINGS:   INTAKE / OUTPUT:  Intake/Output Summary (Last 24 hours) at 12/29/15 1027 Last data filed at 12/29/15 0940  Gross per 24 hour  Intake  24300 ml  Output  32050 ml  Net  -7750 ml    Review of Systems  Constitutional: Negative for fever and chills.  Eyes: Negative for blurred vision.  Respiratory: Negative for cough, hemoptysis, sputum production, shortness of breath and wheezing.   Cardiovascular: Negative for chest pain, palpitations and orthopnea.  Gastrointestinal: Negative for heartburn.  Genitourinary: Positive for hematuria.  Musculoskeletal: Negative for myalgias.  Skin: Negative for rash.  Neurological: Negative for dizziness and headaches.  Endo/Heme/Allergies: Bruises/bleeds easily.  Psychiatric/Behavioral: Negative for depression.  All other systems reviewed and are negative.   Physical Exam  Constitutional: He is oriented to person, place, and time and well-developed, well-nourished, and in no distress.  HENT:  Head: Normocephalic.  Eyes: Pupils are equal, round, and reactive to light.  Cardiovascular: Normal rate, regular rhythm, normal heart sounds and intact distal pulses.   No murmur heard. Pulmonary/Chest: Effort normal. No respiratory distress. He has no wheezes.  Shallow BS at the bases  Abdominal: Soft. Bowel sounds are normal.  Musculoskeletal: Normal range of motion. He exhibits no edema.  Neurological: He is alert and oriented to person, place, and time. No cranial nerve deficit.  Skin: Skin is warm.  Psychiatric: Affect normal.  Nursing note and vitals reviewed.    LABS:  CBC  Recent Labs Lab 12/27/15 0659  12/28/15 0423  12/28/15 1614 12/28/15 2151 12/29/15 0005 12/29/15 0434 12/29/15 0820  WBC 13.6*  --  7.7  --   --   --   --  6.8  --   HGB 8.0*  < > 7.4*  < > 7.6* 7.2* 7.4* 7.2* 7.5*  HCT 24.3*  --  22.3*  < > 22.6* 21.9*  --  21.4*  --   PLT 194  --  175  --    --   --   --  195  --   < > = values in this interval not displayed. Coag's No results for input(s): APTT, INR in the last 168 hours. BMET  Recent Labs Lab 12/27/15 0659 12/28/15 0423 12/29/15 0434  NA 133* 135 135  K 3.7 3.5 3.7  CL 103 105 107  CO2 22 22 21*  BUN 43* 41* 39*  CREATININE 1.33* 1.34* 1.09  GLUCOSE 117* 140* 109*   Electrolytes  Recent Labs Lab 12/27/15 0659 12/28/15 0423 12/29/15 0434  CALCIUM 8.0* 7.9* 7.8*   Sepsis Markers  Recent Labs Lab 12/27/15 0659 12/28/15 0423 12/29/15 0434  PROCALCITON 43.10 27.58 13.85   ABG  Recent Labs Lab 12/26/15 2100  PHART 7.48*  PCO2ART 31*  PO2ART 92   Liver Enzymes  Recent Labs Lab 12/24/15 0837  AST 18  ALT 15*  ALKPHOS 131*  BILITOT 0.3  ALBUMIN 3.4*   Cardiac Enzymes  Recent Labs Lab 12/27/15 0659 12/27/15 1345 12/27/15 1817  TROPONINI 0.04* 0.03 0.04*   Glucose  Recent Labs Lab 12/27/15 0156  GLUCAP 180*    Imaging No results found.  LINES:   CULTURES: 6/8 Blood Cx x 2 > GNR E coli/enterobacter species 6/7 Urine Cx >  ANTIBIOTICS  Vanco 6/8 >  Meropenem 6/6>  Levaquin 6/8-6/8  Zosyn 6/8-6/8  ASSESSMENT / PLAN: 82 year old male past medical history of prostate cancer hypertension, arthritis history of radiation cystitis, recent admission for gross hematuria at which point Plavix was stopped, now with segmental right middle lobe PE and gross hematuria.  Right middle lobe segmental PE Tachypnea Dyspnea Urinary tract infection E coli/eneterobacter species bacteremia  Plan: -CT chest reviewed, patient with right middle lobe segmental pulmonary embolus, without right heart strain. -Patient currently not a candidate for any type of anticoagulation given his gross hematuria and subsequent drop in hemoglobin -Hemoglobin STABLE  today, recommend transfusing 1 unit of packed red blood cells if less than 7 -Further workup of PE to include bilateral venous Doppler  ultrasounds WHICH WAS  NEG for DVT along with echocardiogram of heart -E coli/enterobacter species bacteremia - continue with meropenem,  -Overall patient with complicated medical history and poor prognosis -OK TO TRANSFER TO GEN MED FLOOR  At THIS TIME, NO ACUTE RESP ISSUES, RECOMMEND BIPAP AT NIGHT WILL SIGN OFF AT THIS TIME, PLEASE CALL BACK WITH ANY ISSUES [phone]     The Patient requires high complexity decision making for assessment and support, frequent evaluation and titration of therapies, application of advanced monitoring technologies and extensive interpretation of multiple databases.  prognosis is guarded.     Corrin Parker, M.D.  [facility]  Medical Director [place] Director [facility] Cardio-Pulmonary Department

## 2015-12-29 NOTE — Progress Notes (Signed)
Transferred from CCU, alert and oriented, wife at bedside, on 2 l oxygen acutely, CBI in place with pink/ red output, denies pain, good appetite, on IV antibiotics, urology rounded on patient this evening.

## 2015-12-29 NOTE — Progress Notes (Signed)
Geoffrey Barber is alert and oriented. No c/o pain. On 3L [place]. VSS. Take medications whole without difficulty. Transferred to room 105 with telemetry  this morning. Report given to [place], Therapist, sports. All belongings sent with patient. Still requiring CBI. Urine is pink colored.Wife at bedside.

## 2015-12-29 NOTE — Progress Notes (Addendum)
[place] [facility] [place] NAME: Geoffrey Barber    MR#:  [id number]  DATE OF BIRTH:  Aug 09, 1924  SUBJECTIVE:  CHIEF COMPLAINT:  Patient Is resting comfortably, had IVC filter placement 12/27/2015, shortness of breath significantly improved. Afebrile in the past 24 hours Still having hematuria with clots  REVIEW OF SYSTEMS:  CONSTITUTIONAL: No fever, fatigue or weakness.  EYES: No blurred or double vision.  EARS, NOSE, AND THROAT: No tinnitus or ear pain.  RESPIRATORY: No cough, shortness of breath, denies wheezing or hemoptysis.  CARDIOVASCULAR: No chest pain, orthopnea, edema.  GASTROINTESTINAL: No nausea, vomiting, diarrhea or abdominal pain.  GENITOURINARY: No dysuria,Has hematuria.  ENDOCRINE: No polyuria, nocturia,  HEMATOLOGY: No anemia, easy bruising or bleeding SKIN: No rash or lesion. MUSCULOSKELETAL: No joint pain or arthritis.   NEUROLOGIC: No tingling, numbness, weakness.  PSYCHIATRY: No anxiety or depression.   DRUG ALLERGIES:  No Known Allergies  VITALS:  Blood pressure 128/55, pulse 83, temperature 99.4 F (37.4 C), temperature source Oral, resp. rate 20, height 6' (1.829 m), weight 97.523 kg (215 lb), SpO2 98 %.  PHYSICAL EXAMINATION:  GENERAL:  82 y.o.-year-old patient lying in the bed with no acute distress.  EYES: Pupils equal, round, reactive to light and accommodation. No scleral icterus. Extraocular muscles intact.  HEENT: Head atraumatic, normocephalic. Oropharynx and nasopharynx clear.  NECK:  Supple, no jugular venous distention. No thyroid enlargement, no tenderness.  LUNGS:Moderate breath sounds bilaterally and diminished in the lower lung fields, no wheezing, rales,rhonchi or crepitation. No use of accessory muscles of respiration. Off BiPAP  CARDIOVASCULAR: S1, S2 normal. No murmurs, rubs, or gallops.  ABDOMEN: Soft, nontender, nondistended. Bowel sounds present. No organomegaly or mass. Foley catheter with  bloody urine  EXTREMITIES: No pedal edema, cyanosis, or clubbing.  NEUROLOGIC: Cranial nerves II through XII are intact. Muscle strength 5/5 in all extremities. Sensation intact. Gait not checked.  PSYCHIATRIC: The patient is alert and oriented x 3.  SKIN: No obvious rash, lesion, or ulcer.    LABORATORY PANEL:   CBC  Recent Labs Lab 12/29/15 0434 12/29/15 0820  WBC 6.8  --   HGB 7.2* 7.5*  HCT 21.4*  --   PLT 195  --    ------------------------------------------------------------------------------------------------------------------  Chemistries   Recent Labs Lab 12/24/15 0837  12/29/15 0434  NA 134*  < > 135  K 3.9  < > 3.7  CL 104  < > 107  CO2 22  < > 21*  GLUCOSE 113*  < > 109*  BUN 30*  < > 39*  CREATININE 0.97  < > 1.09  CALCIUM 8.5*  < > 7.8*  AST 18  --   --   ALT 15*  --   --   ALKPHOS 131*  --   --   BILITOT 0.3  --   --   < > = values in this interval not displayed. ------------------------------------------------------------------------------------------------------------------  Cardiac Enzymes  Recent Labs Lab 12/27/15 1817  TROPONINI 0.04*   ------------------------------------------------------------------------------------------------------------------  RADIOLOGY:  No results found.  EKG:   Orders placed or performed in visit on 02/24/12  . 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   #Shortness of breath secondary to sepsis from healthcare associated pneumonia And pulmonary embolism  Pulmonary embolism cannot anticoagulates in view of hematuria, status post IVC filter placement :  DVT ruled out in the lower extremities   sputum culture and sensitivity is ordered. Chest x-ray with possible infiltrate Patient was on IV Zosyn and vancomycin. Antibiotics changed to Rocephin Continue BiPAP as needed . Currently  not on BiPAP Appreciate pulmonology recommendations  Breathing treatments as needed will be provided. Continue oxygen Monitor WBC closely, WBC at 21.6 -13.6-7.7  #Sepsis and positive blood cultures and urine cultures Blood cultures are positive with Escherichia coli and Enterobacter, Escherichia coli is pansensitive to all antibiotics Urine cultures are positive with Escherichia coli pansensitive Change meropenem  to Rocephin   #Acute cystitis E coli Pansensitive, changed to Rocephin  #. Hematuria: Secondary to radiation cystitis Urology is recommending CBD  Monitor hemoglobin and hematocrit closely and transfuse as needed.hemoglobin 7.6-8.0-7.9, 7.6, 7.4, 7.8, 7.5 Follow-up in urology recommendations  #. Essential hypertension: Blood pressure is acceptable. Continue Norvasc, lisinopril and HCTZ  #. BPH: Continue Hytrin  #. GERD: Continue PPI   Physical therapy is recommending skilled nursing care  All the records are reviewed and case discussed with Care Management/Social Workerr. Management plans discussed with the patient, wife and they are in agreement.  CODE STATUS: fc  TOTAL critical TIME TAKING CARE OF THIS PATIENT: 39 minutes.   POSSIBLE D/C IN 2=3 DAYS, DEPENDING ON CLINICAL CONDITION.  Note: This dictation was prepared with Dragon dictation along with smaller phrase technology. Any transcriptional errors that result from this process are unintentional.   Nicholes Mango M.D on 12/29/2015 at 2:27 PM  Between 7am to 6pm - Pager - [phone] After 6pm go to [email or web address] - password [email or web address] [facility]  Office  [phone]  CC: Primary care physician; BABAOFF, Caryl Bis, MD

## 2015-12-29 NOTE — NC FL2 (Signed)
[place] LEVEL OF CARE SCREENING TOOL     IDENTIFICATION  Patient Name: Geoffrey Barber Birthdate: 04/03/1925 Sex: male Admission Date (Current Location): 12/25/2015  [place] and [place] Number:  Engineering geologist and Address:  [facility], [address], [place], [place] [address]      Provider Number: [id number]  Attending Physician Name and Address:  Nicholes Mango, MD  Relative Name and Phone Number:       Current Level of Care: Hospital Recommended Level of Care: [place] Prior Approval Number:    Date Approved/Denied:   PASRR Number:  [id number])  Discharge Plan: SNF    Current Diagnoses: Patient Active Problem List   Diagnosis Date Noted  . Hematuria 12/11/2015    Orientation RESPIRATION BLADDER Height & Weight     Self, Time, Situation, Place  O2 (Nasal Cannula 2 (L/min)) Continent Weight: 215 lb (97.523 kg) Height:  6' (182.9 cm)  BEHAVIORAL SYMPTOMS/MOOD NEUROLOGICAL BOWEL NUTRITION STATUS   (None)  (None) Continent Diet (Heart)  AMBULATORY STATUS COMMUNICATION OF NEEDS Skin   Extensive Assist Verbally Normal                       Personal Care Assistance Level of Assistance  Bathing, Feeding, Dressing Bathing Assistance: Limited assistance Feeding assistance: Independent Dressing Assistance: Limited assistance     Functional Limitations Info  Sight, Hearing, Speech Sight Info: Adequate Hearing Info: Adequate Speech Info: Impaired    SPECIAL CARE FACTORS FREQUENCY  PT (By licensed PT), OT (By licensed OT)     PT Frequency:  (5) OT Frequency:  (5)            Contractures      Additional Factors Info  Code Status, Allergies Code Status Info:  (Full Code) Allergies Info:  (No Known Allergies)           Current Medications (12/29/2015):  This is the current hospital active medication list Current Facility-Administered Medications  Medication Dose Route Frequency  Provider Last Rate Last Dose  . acetaminophen (TYLENOL) tablet 650 mg  650 mg Oral Q6H PRN Bettey Costa, MD   650 mg at 12/27/15 2044  . amLODipine (NORVASC) tablet 5 mg  5 mg Oral Daily Bettey Costa, MD   5 mg at 12/29/15 0920  . antiseptic oral rinse (CPC / CETYLPYRIDINIUM CHLORIDE 0.05%) solution 7 mL  7 mL Mouth Rinse BID Nicholes Mango, MD   7 mL at 12/29/15 0925  . cefTRIAXone (ROCEPHIN) 1 g in dextrose 5 % 50 mL IVPB  1 g Intravenous Q24H Nicholes Mango, MD      . cholecalciferol (VITAMIN D) tablet 1,000 Units  1,000 Units Oral Daily Bettey Costa, MD   1,000 Units at 12/29/15 0919  . dapsone tablet 25 mg  25 mg Oral QODAY Bettey Costa, MD   25 mg at 12/29/15 0925  . HYDROcodone-acetaminophen (NORCO/VICODIN) 5-325 MG per tablet 1-2 tablet  1-2 tablet Oral Q4H PRN Bettey Costa, MD   2 tablet at 12/28/15 0037  . ipratropium-albuterol (DUONEB) 0.5-2.5 (3) MG/3ML nebulizer solution 3 mL  3 mL Nebulization TID Nicholes Mango, MD   3 mL at 12/29/15 1411  . lisinopril (PRINIVIL,ZESTRIL) tablet 10 mg  10 mg Oral Daily Bettey Costa, MD   10 mg at 12/29/15 0920  . morphine 2 MG/ML injection 2 mg  2 mg Intravenous Q6H PRN Nicholes Mango, MD      . multivitamin with minerals tablet  1 tablet  1 tablet Oral Daily Bettey Costa, MD   1 tablet at 12/29/15 0919  . multivitamin-lutein (OCUVITE-LUTEIN) capsule 1 capsule  1 capsule Oral BID Bettey Costa, MD   1 capsule at 12/29/15 0920  . ondansetron (ZOFRAN) tablet 4 mg  4 mg Oral Q6H PRN Bettey Costa, MD       Or  . ondansetron (ZOFRAN) injection 4 mg  4 mg Intravenous Q6H PRN Sital Mody, MD      . opium-belladonna (B&O SUPPRETTES) 16.2-60 MG suppository 1 suppository  1 suppository Rectal Q8H PRN Hollice Espy, MD   1 suppository at 12/28/15 0059  . pantoprazole (PROTONIX) EC tablet 40 mg  40 mg Oral Daily Bettey Costa, MD   40 mg at 12/29/15 0920  . psyllium (HYDROCIL/METAMUCIL) packet 1 packet  1 packet Oral Daily Bettey Costa, MD   1 packet at 12/28/15 1031  . senna-docusate (Senokot-S)  tablet 1 tablet  1 tablet Oral QHS PRN Bettey Costa, MD   1 tablet at 12/29/15 0421  . terazosin (HYTRIN) capsule 5 mg  5 mg Oral QHS Bettey Costa, MD   5 mg at 12/28/15 2111     Discharge Medications: Please see discharge summary for a list of discharge medications.  Relevant Imaging Results:  Relevant Lab Results:   Additional Information  (SSN [id number])  Lorenso Quarry Sunkins, LCSW

## 2015-12-30 LAB — HEMOGLOBIN: HEMOGLOBIN: 6.9 g/dL — AB (ref 13.0–18.0)

## 2015-12-30 LAB — HEMOGLOBIN AND HEMATOCRIT, BLOOD
HCT: 24.3 % — ABNORMAL LOW (ref 40.0–52.0)
HEMATOCRIT: 21.5 % — AB (ref 40.0–52.0)
HEMOGLOBIN: 7.3 g/dL — AB (ref 13.0–18.0)
Hemoglobin: 8.2 g/dL — ABNORMAL LOW (ref 13.0–18.0)

## 2015-12-30 LAB — PREPARE RBC (CROSSMATCH)

## 2015-12-30 MED ORDER — CEFTRIAXONE SODIUM 2 G IJ SOLR
2.0000 g | INTRAMUSCULAR | Status: DC
  Administered 2015-12-30 – 2016-01-02 (×4): 2 g via INTRAVENOUS
  Filled 2015-12-30 (×4): qty 2

## 2015-12-30 MED ORDER — SODIUM CHLORIDE 0.9 % IV SOLN
Freq: Once | INTRAVENOUS | Status: AC
  Administered 2015-12-30: 11:00:00 via INTRAVENOUS

## 2015-12-30 NOTE — Care Management Important Message (Signed)
Important Message  Patient Details  Name: Geoffrey Barber MRN: [id number] Date of Birth: [birth year]   Medicare Important Message Given:  Yes    Shelbie Ammons, RN 12/30/2015, 8:41 AM

## 2015-12-30 NOTE — Progress Notes (Signed)
[place] at [place] NAME: Geoffrey Barber    MR#:  [id number]  DATE OF BIRTH:  [birth year]  SUBJECTIVE:  CHIEF COMPLAINT:  Has some  Cough but overall better.had IVC filter placement 12/27/2015, shortness of breath significantly improved. Afebrile in the past 24 hours. Hematuria is improving,ponk colored urine.in foley.CBI in progress, REVIEW OF SYSTEMS:  CONSTITUTIONAL: No fever, fatigue or weakness.  EYES: No blurred or double vision.  EARS, NOSE, AND THROAT: No tinnitus or ear pain.  RESPIRATORY: No cough, shortness of breath, denies wheezing or hemoptysis.  CARDIOVASCULAR: No chest pain, orthopnea, edema.  GASTROINTESTINAL: No nausea, vomiting, diarrhea or abdominal pain.  GENITOURINARY: No dysuria,Has hematuria.  ENDOCRINE: No polyuria, nocturia,  HEMATOLOGY: No anemia, easy bruising or bleeding SKIN: No rash or lesion. MUSCULOSKELETAL: No joint pain or arthritis.   NEUROLOGIC: No tingling, numbness, weakness.  PSYCHIATRY: No anxiety or depression.   DRUG ALLERGIES:  No Known Allergies  VITALS:  Blood pressure 114/60, pulse 84, temperature 98.1 F (36.7 C), temperature source Oral, resp. rate 18, height 6' (1.829 m), weight 97.523 kg (215 lb), SpO2 93 %.  PHYSICAL EXAMINATION:  GENERAL:  82 y.o.-year-old patient lying in the bed with no acute distress.  EYES: Pupils equal, round, reactive to light and accommodation. No scleral icterus. Extraocular muscles intact.  HEENT: Head atraumatic, normocephalic. Oropharynx and nasopharynx clear.  NECK:  Supple, no jugular venous distention. No thyroid enlargement, no tenderness.  LUNGS:Moderate breath sounds bilaterally and diminished in the lower lung fields, has mild wheezing in right upper lung fields.no , rales,rhonchi or crepitation. No use of accessory muscles of respiration. Off BiPAP  CARDIOVASCULAR: S1, S2 normal. No murmurs, rubs, or gallops.  ABDOMEN: Soft, nontender,  nondistended. Bowel sounds present. No organomegaly or mass. Foley catheter with bloody urine  EXTREMITIES: No pedal edema, cyanosis, or clubbing.  NEUROLOGIC: Cranial nerves II through XII are intact. Muscle strength 5/5 in all extremities. Sensation intact. Gait not checked.  PSYCHIATRIC: The patient is alert and oriented x 3.  SKIN: No obvious rash, lesion, or ulcer.    LABORATORY PANEL:   CBC  Recent Labs Lab 12/29/15 0434  12/30/15 0006  WBC 6.8  --   --   HGB 7.2*  < > 6.9*  HCT 21.4*  --   --   PLT 195  --   --   < > = values in this interval not displayed. ------------------------------------------------------------------------------------------------------------------  Chemistries   Recent Labs Lab 12/24/15 0837  12/29/15 0434  NA 134*  < > 135  K 3.9  < > 3.7  CL 104  < > 107  CO2 22  < > 21*  GLUCOSE 113*  < > 109*  BUN 30*  < > 39*  CREATININE 0.97  < > 1.09  CALCIUM 8.5*  < > 7.8*  AST 18  --   --   ALT 15*  --   --   ALKPHOS 131*  --   --   BILITOT 0.3  --   --   < > = values in this interval not displayed. ------------------------------------------------------------------------------------------------------------------  Cardiac Enzymes  Recent Labs Lab 12/27/15 1817  TROPONINI 0.04*   ------------------------------------------------------------------------------------------------------------------  RADIOLOGY:  No results found.  EKG:   Orders placed or performed in visit on 02/24/12  . EKG 12-Lead    ASSESSMENT AND PLAN:   82 year old male with a history of radiation and prostate cancer who presents with, tree of likely related to  radiation cystitis.   #Shortness of breath secondary to sepsis from healthcare associated pneumonia And pulmonary embolism  Pulmonary embolism cannot anticoagulates in view of hematuria, status post IVC filter placement : DVT ruled out in the lower extremities    HCAP;sputum culture and sensitivity is  ordered. Chest x-ray with possible infiltrate On Rocephin,o2.continue duonebs for wheezing,rpt chest xray today. Monitor WBC closely, WBC at 21.6 -13.6-7.7  #Sepsis and positive blood cultures and urine cultures Blood cultures are positive with Escherichia coli and Enterobacter, Escherichia coli is pansensitive to all antibiotics Urine cultures are positive with Escherichia coli pansensitive Change meropenem  to Rocephin  Rpt blood cultures today , for follow up  #Acute cystitis E coli Pansensitive, changed to Rocephin  #. Hematuria: Secondary to radiation cystitis Urology is recommending CBD  Monitor hemoglobin and hematocrit closely and transfuse as needed.hemoglobin 7.6-8.0-7.9, 7.6, 7.4, 7.8, 7.5 Follow-up in urology recommendations Acute on chronic  anemia:one unit of prbc  Transfusion.  #. Essential hypertension: Blood pressure is acceptable. Continue Norvasc, lisinopril and HCTZ  #. BPH: Continue Hytrin  #. GERD: Continue PPI   Physical therapy is recommending skilled nursing care  All the records are reviewed and case discussed with Care Management/Social Workerr. Management plans discussed with the patient, wife and they are in agreement.  CODE STATUS: fc  TOTAL critical TIME TAKING CARE OF THIS PATIENT: 35 minutes.   POSSIBLE D/C IN 2=3 DAYS, DEPENDING ON CLINICAL CONDITION.  Note: This dictation was prepared with Dragon dictation along with smaller phrase technology. Any transcriptional errors that result from this process are unintentional.   Brittanee Ghazarian M.D on 12/30/2015 at 7:16 AM  Between 7am to 6pm - Pager - [phone] After 6pm go to [email or web address] - password [email or web address] [facility]  Office  [phone]  CC: Primary care physician; BABAOFF, Caryl Bis, MD

## 2015-12-30 NOTE — Progress Notes (Signed)
Ceftriaxone 1g q 24 hours is ordered for a pt with bacteremia. Per protocol, will increase dose to 2g q 24 hours due to indication.  Ramond Dial, Pharm.D Clinical Pharmacist

## 2015-12-30 NOTE — Progress Notes (Signed)
Urology Consult Follow Up  Subjective: Hematuria improving dramatically. On very slow drip this morning with clear urine. CBI stopped for one hour for possible voiding trial.  Anti-infectives: Anti-infectives    Start     Dose/Rate Route Frequency Ordered Stop   12/30/15 1000  cefTRIAXone (ROCEPHIN) 2 g in dextrose 5 % 50 mL IVPB     2 g 100 mL/hr over 30 Minutes Intravenous Every 24 hours 12/30/15 0811     12/29/15 1445  cefTRIAXone (ROCEPHIN) 1 g in dextrose 5 % 50 mL IVPB  Status:  Discontinued     1 g 100 mL/hr over 30 Minutes Intravenous Every 24 hours 12/29/15 1434 12/30/15 0811   12/27/15 2000  meropenem (MERREM) 1 g in sodium chloride 0.9 % 100 mL IVPB  Status:  Discontinued     1 g 200 mL/hr over 30 Minutes Intravenous Every 8 hours 12/27/15 1401 12/29/15 1434   12/27/15 1900  vancomycin (VANCOCIN) 1,250 mg in sodium chloride 0.9 % 250 mL IVPB  Status:  Discontinued     1,250 mg 166.7 mL/hr over 90 Minutes Intravenous Every 18 hours 12/26/15 1614 12/27/15 1348   12/27/15 1015  ceFAZolin (ANCEF) IVPB 2g/100 mL premix     2 g 200 mL/hr over 30 Minutes Intravenous On call to O.R. 12/27/15 1001 12/28/15 0559   12/27/15 0300  meropenem (MERREM) 2 g in sodium chloride 0.9 % 100 mL IVPB  Status:  Discontinued     2 g 200 mL/hr over 30 Minutes Intravenous Every 8 hours 12/27/15 0246 12/27/15 1401   12/27/15 0100  vancomycin (VANCOCIN) 1,250 mg in sodium chloride 0.9 % 250 mL IVPB     1,250 mg 166.7 mL/hr over 90 Minutes Intravenous  Once 12/26/15 1614 12/27/15 0410   12/26/15 2100  levofloxacin (LEVAQUIN) IVPB 750 mg  Status:  Discontinued     750 mg 100 mL/hr over 90 Minutes Intravenous Every 24 hours 12/26/15 2051 12/27/15 0246   12/26/15 1630  vancomycin (VANCOCIN) 1,250 mg in sodium chloride 0.9 % 250 mL IVPB     1,250 mg 166.7 mL/hr over 90 Minutes Intravenous  Once 12/26/15 1613 12/26/15 1943   12/26/15 1530  piperacillin-tazobactam (ZOSYN) IVPB 3.375 g  Status:   Discontinued     3.375 g 12.5 mL/hr over 240 Minutes Intravenous Every 8 hours 12/26/15 1523 12/27/15 0246   12/25/15 1630  dapsone tablet 25 mg     25 mg Oral Every other day 12/25/15 1623        Current Facility-Administered Medications  Medication Dose Route Frequency Provider Last Rate Last Dose  . acetaminophen (TYLENOL) tablet 650 mg  650 mg Oral Q6H PRN Bettey Costa, MD   650 mg at 12/27/15 2044  . amLODipine (NORVASC) tablet 5 mg  5 mg Oral Daily Bettey Costa, MD   5 mg at 12/30/15 0858  . antiseptic oral rinse (CPC / CETYLPYRIDINIUM CHLORIDE 0.05%) solution 7 mL  7 mL Mouth Rinse BID Nicholes Mango, MD   7 mL at 12/30/15 0902  . cefTRIAXone (ROCEPHIN) 2 g in dextrose 5 % 50 mL IVPB  2 g Intravenous Q24H Epifanio Lesches, MD   2 g at 12/30/15 0901  . cholecalciferol (VITAMIN D) tablet 1,000 Units  1,000 Units Oral Daily Bettey Costa, MD   1,000 Units at 12/30/15 0858  . dapsone tablet 25 mg  25 mg Oral QODAY Bettey Costa, MD   25 mg at 12/29/15 0925  . HYDROcodone-acetaminophen (NORCO/VICODIN) 5-325 MG per tablet  1-2 tablet  1-2 tablet Oral Q4H PRN Bettey Costa, MD   2 tablet at 12/28/15 0037  . ipratropium-albuterol (DUONEB) 0.5-2.5 (3) MG/3ML nebulizer solution 3 mL  3 mL Nebulization TID Nicholes Mango, MD   3 mL at 12/30/15 0732  . lisinopril (PRINIVIL,ZESTRIL) tablet 10 mg  10 mg Oral Daily Bettey Costa, MD   10 mg at 12/30/15 0858  . morphine 2 MG/ML injection 2 mg  2 mg Intravenous Q6H PRN Nicholes Mango, MD      . multivitamin with minerals tablet 1 tablet  1 tablet Oral Daily Bettey Costa, MD   1 tablet at 12/30/15 0858  . multivitamin-lutein (OCUVITE-LUTEIN) capsule 1 capsule  1 capsule Oral BID Bettey Costa, MD   1 capsule at 12/30/15 0858  . ondansetron (ZOFRAN) tablet 4 mg  4 mg Oral Q6H PRN Bettey Costa, MD       Or  . ondansetron (ZOFRAN) injection 4 mg  4 mg Intravenous Q6H PRN Sital Mody, MD      . opium-belladonna (B&O SUPPRETTES) 16.2-60 MG suppository 1 suppository  1 suppository Rectal  Q8H PRN Hollice Espy, MD   1 suppository at 12/28/15 0059  . pantoprazole (PROTONIX) EC tablet 40 mg  40 mg Oral Daily Bettey Costa, MD   40 mg at 12/30/15 0858  . psyllium (HYDROCIL/METAMUCIL) packet 1 packet  1 packet Oral Daily Sital Mody, MD      . senna-docusate (Senokot-S) tablet 1 tablet  1 tablet Oral QHS PRN Bettey Costa, MD   1 tablet at 12/29/15 0421  . terazosin (HYTRIN) capsule 5 mg  5 mg Oral QHS Bettey Costa, MD   5 mg at 12/29/15 2012     Objective: Vital signs in last 24 hours: Temp:  [98 F (36.7 C)-99 F (37.2 C)] 98 F (36.7 C) (06/12 1119) Pulse Rate:  [74-87] 74 (06/12 1119) Resp:  [18-20] 18 (06/12 1119) BP: (107-119)/(58-67) 107/59 mmHg (06/12 1119) SpO2:  [93 %-98 %] 98 % (06/12 1119)  Intake/Output from previous day: 06/11 0701 - 06/12 0700 In: [id number] [P.O.:480; IV Piggyback:150] Out: [id number] [Urine:30950; Stool:1] Intake/Output this shift: Total I/O In: 120 [P.O.:120] Out: 1900 [Urine:1900]   Physical Exam General: well-developed male, in no acute distress HEENT: Atraumatic, moist membranes Neck: supple, trachea midline Lungs: moving air without restriction and without use of accessory muscles Cardiac: Regular rate Abd: Soft, nontender, nondistended GU: 24 Fr. rusch cath in place with in-flowing saline. Efflux is clear on a slow drip  Ext: No edema Neuro: CN grossly intact. Moves all extremities  Lab Results:   Recent Labs  12/28/15 0423  12/29/15 0434  12/30/15 0006 12/30/15 0802  WBC 7.7  --  6.8  --   --   --   HGB 7.4*  < > 7.2*  < > 6.9* 7.3*  HCT 22.3*  < > 21.4*  --   --  21.5*  PLT 175  --  195  --   --   --   < > = values in this interval not displayed. BMET  Recent Labs  12/28/15 0423 12/29/15 0434  NA 135 135  K 3.5 3.7  CL 105 107  CO2 22 21*  GLUCOSE 140* 109*  BUN 41* 39*  CREATININE 1.34* 1.09  CALCIUM 7.9* 7.8*    Assessment: 82 year old male admitted with gross hematuria secondary to radiation cystitis and  hemorrhagic infectious cystitis. Hospital course, located by PE status post IVC filter and gram negative rod bacteremia/sepsis. Clinically improving.  Plan: -Stop CBI as possible -Consider removing Foley if urine remains clear x several hours -Ideally, he will pass a voiding trial prior to discharge -continue appropriate abx for +Ucx/BCx    LOS: 5 days    Hollice Espy 12/30/2015

## 2015-12-30 NOTE — Progress Notes (Signed)
Reported to Dr Erlene Quan that patient had blood in catheter. Orders to start CBI again.

## 2015-12-30 NOTE — Progress Notes (Signed)
Physical Therapy Treatment Patient Details Name: Geoffrey Barber MRN: [id number] DOB: 05-Jul-1925 Today's Date: 12/30/2015    History of Present Illness Geoffrey Barber  is a 82 y.o. male with a known history of Prostate cancer, urethral stricture who was discharged about a week ago for hematuria. Patient was discharged to skilled nursing facility and presents today with hematuria. During last hospitalization patient had a CT scan which showed debris in the bladder which could be a mass or focal hemorrhage. Plavix was discontinued. Patient was seen by urology during that hospitalization. At discharge patient's hematuria had resolved. Dr. Erlene Quan was called by the ER physician and recommendations were to continue CBI. Plan is to follow-up for outpatient cystoscopy. Pt reports 2 falls in the last 12 months.    PT Comments    Pt is very deconditioned and appears weaker than during the initial evaluation. He requires considerable assist for bed mobility, transfers, and very limited ambulation from bed to recliner. Pt is unsafe to ambulate further at this time. He is able to complete all seated exercises but with notable LE weakness, especially with hip flexion. Pt will benefit from skilled PT services to address deficits in strength, balance, and mobility in order to return to full function at home.    Follow Up Recommendations  SNF     Equipment Recommendations  Rolling walker with 5" wheels (has own RW)    Recommendations for Other Services       Precautions / Restrictions Precautions Precautions: Fall Restrictions Weight Bearing Restrictions: No    Mobility  Bed Mobility Overal bed mobility: Needs Assistance Bed Mobility: Supine to Sit     Supine to sit: Mod assist     General bed mobility comments: Pt requires heavy assist for LE and trunk support. Pt moves very slow with extremely deconditioned LEs  Transfers Overall transfer level: Needs assistance Equipment used: Rolling  walker (2 wheeled) Transfers: Sit to/from Stand Sit to Stand: Mod assist         General transfer comment: Pt demonstrates decreased LE support during transfers. He leans heavily forward and tries to pull up on walker. Cues for safe hand placement and proper sequencing  Ambulation/Gait Ambulation/Gait assistance: Min assist Ambulation Distance (Feet): 5 Feet Assistive device: Rolling walker (2 wheeled)   Gait velocity: Decreased Gait velocity interpretation: Below normal speed for age/gender General Gait Details: Pt able to take small steps from bed to recliner. He is very unsteady and unsafe. Pt requires minA+1 for balance. Unsafe to attempt further ambualtion at this time. Pt remains on supplemental O2 throughout therapy session   Stairs            Wheelchair Mobility    Modified Rankin (Stroke Patients Only)       Balance Overall balance assessment: Needs assistance Sitting-balance support: No upper extremity supported Sitting balance-Leahy Scale: Good     Standing balance support: Bilateral upper extremity supported Standing balance-Leahy Scale: Poor                      Cognition Arousal/Alertness: Awake/alert Behavior During Therapy: WFL for tasks assessed/performed Overall Cognitive Status: Within Functional Limits for tasks assessed                      Exercises General Exercises - Lower Extremity Long Arc Quad: Strengthening;Both;10 reps;Seated Heel Slides: Strengthening;Both;10 reps;Seated Hip ABduction/ADduction: Strengthening;Both;10 reps;Seated Hip Flexion/Marching: Strengthening;Both;10 reps;Seated    General Comments General comments (skin integrity, edema, etc.):  Foley with bloody urine      Pertinent Vitals/Pain Pain Assessment: No/denies pain Pain Location: Pt currently denying pain    Home Living                      Prior Function            PT Goals (current goals can now be found in the care plan  section) Acute Rehab PT Goals Patient Stated Goal: Return to prior function at home PT Goal Formulation: With patient Time For Goal Achievement: 01/09/16 Potential to Achieve Goals: Fair Progress towards PT goals: Progressing toward goals    Frequency  Min 2X/week    PT Plan Current plan remains appropriate    Co-evaluation             End of Session Equipment Utilized During Treatment: Gait belt Activity Tolerance: Patient tolerated treatment well Patient left: with call bell/phone within reach;in chair;Other (comment);with nursing/sitter in room     Time: 1653-1710 PT Time Calculation (min) (ACUTE ONLY): 17 min  Charges:  $Therapeutic Exercise: 8-22 mins                    G Codes:      Geoffrey Barber PT, DPT   Geoffrey Barber 12/30/2015, 8:06 PM

## 2015-12-31 LAB — TYPE AND SCREEN
ABO/RH(D): O POS
Antibody Screen: NEGATIVE
Unit division: 0

## 2015-12-31 LAB — CBC
HEMATOCRIT: 23.2 % — AB (ref 40.0–52.0)
HEMOGLOBIN: 7.9 g/dL — AB (ref 13.0–18.0)
MCH: 28.3 pg (ref 26.0–34.0)
MCHC: 34.3 g/dL (ref 32.0–36.0)
MCV: 82.7 fL (ref 80.0–100.0)
Platelets: 232 10*3/uL (ref 150–440)
RBC: 2.8 MIL/uL — ABNORMAL LOW (ref 4.40–5.90)
RDW: 16.7 % — ABNORMAL HIGH (ref 11.5–14.5)
WBC: 7.4 10*3/uL (ref 3.8–10.6)

## 2015-12-31 MED ORDER — IPRATROPIUM-ALBUTEROL 0.5-2.5 (3) MG/3ML IN SOLN
3.0000 mL | Freq: Four times a day (QID) | RESPIRATORY_TRACT | Status: DC | PRN
  Administered 2015-12-31: 15:00:00 3 mL via RESPIRATORY_TRACT
  Filled 2015-12-31: qty 3

## 2015-12-31 NOTE — Clinical Documentation Improvement (Signed)
Internal Medicine  Can the diagnosis of  anemia be further specified?   Acute Blood Loss Anemia, including the suspected or known cause or associated condition(s)  Precipitous drop in Hematocrit, including the suspected or known cause or associated condition(s)  Other  Clinically Undetermined  Document any associated diagnoses/conditions. Supporting Information: Component     Latest Ref Rng 12/28/2015 12/28/2015 12/29/2015 12/29/2015 12/29/2015         4:14 PM  9:51 PM 12:05 AM  4:34 AM  8:20 AM  Hemoglobin     13.0 - 18.0 g/dL 7.6 (L) 7.2 (L) 7.4 (L) 7.2 (L) 7.5 (L)   Component     Latest Ref Rng 12/29/2015 12/30/2015 12/30/2015 12/30/2015 12/31/2015         4:04 PM 12:06 AM  8:02 AM  8:20 PM   Hemoglobin     13.0 - 18.0 g/dL 7.6 (L) 6.9 (L) 7.3 (L) 8.2 (L) 7.9 (L)   12/30/15 one unit of prbc Transfusion.  Please exercise your independent, professional judgment when responding. A specific answer is not anticipated or expected. Please update your documentation within the medical record to reflect your response to this query. Thank you  Thank You,  [place] [phone]

## 2015-12-31 NOTE — Progress Notes (Signed)
Geoffrey Barber at [place] NAME: Geoffrey Barber    MR#:  [id number]  DATE OF BIRTH:  [birth year]  SUBJECTIVE: Hematuria is improving. Not able to tolerate the BiPAP mask. Patient not  Able  to sleep with BiPAP mask   CHIEF COMPLAINT:   REVIEW OF SYSTEMS:  CONSTITUTIONAL: No fever, fatigue or weakness.  EYES: No blurred or double vision.  EARS, NOSE, AND THROAT: No tinnitus or ear pain.  RESPIRATORY: No cough, shortness of breath, denies wheezing or hemoptysis.  CARDIOVASCULAR: No chest pain, orthopnea, edema.  GASTROINTESTINAL: No nausea, vomiting, diarrhea or abdominal pain.  GENITOURINARY: No dysuria,Has hematuria.  ENDOCRINE: No polyuria, nocturia,  HEMATOLOGY: No anemia, easy bruising or bleeding SKIN: No rash or lesion. MUSCULOSKELETAL: No joint pain or arthritis.   NEUROLOGIC: No tingling, numbness, weakness.  PSYCHIATRY: No anxiety or depression.   DRUG ALLERGIES:  No Known Allergies  VITALS:  Blood pressure 116/62, pulse 83, temperature 98.3 F (36.8 C), temperature source Oral, resp. rate 18, height 6' (1.829 m), weight 97.523 kg (215 lb), SpO2 96 %.  PHYSICAL EXAMINATION:  GENERAL:  82 y.o.-year-old patient lying in the bed with no acute distress.  EYES: Pupils equal, round, reactive to light and accommodation. No scleral icterus. Extraocular muscles intact.  HEENT: Head atraumatic, normocephalic. Oropharynx and nasopharynx clear.  NECK:  Supple, no jugular venous distention. No thyroid enlargement, no tenderness.  LUNGS: clear to  Auscultation bilaterally..no rales,rhonchi or crepitation. No use of accessory muscles of respiration. Off BiPAP  CARDIOVASCULAR: S1, S2 normal. No murmurs, rubs, or gallops.  ABDOMEN: Soft, nontender, nondistended. Bowel sounds present. No organomegaly or mass. Foley catheter with bloody urine  EXTREMITIES: No pedal edema, cyanosis, or clubbing.  NEUROLOGIC: Cranial nerves II through XII are  intact. Muscle strength 5/5 in all extremities. Sensation intact. Gait not checked.  PSYCHIATRIC: The patient is alert and oriented x 3.  SKIN: No obvious rash, lesion, or ulcer.    LABORATORY PANEL:   CBC  Recent Labs Lab 12/31/15 0508  WBC 7.4  HGB 7.9*  HCT 23.2*  PLT 232   ------------------------------------------------------------------------------------------------------------------  Chemistries   Recent Labs Lab 12/24/15 0837  12/29/15 0434  NA 134*  < > 135  K 3.9  < > 3.7  CL 104  < > 107  CO2 22  < > 21*  GLUCOSE 113*  < > 109*  BUN 30*  < > 39*  CREATININE 0.97  < > 1.09  CALCIUM 8.5*  < > 7.8*  AST 18  --   --   ALT 15*  --   --   ALKPHOS 131*  --   --   BILITOT 0.3  --   --   < > = values in this interval not displayed. ------------------------------------------------------------------------------------------------------------------  Cardiac Enzymes  Recent Labs Lab 12/27/15 1817  TROPONINI 0.04*   ------------------------------------------------------------------------------------------------------------------  RADIOLOGY:  No results found.  EKG:   Orders placed or performed in visit on 02/24/12  . 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   #Shortness of breath secondary to sepsis from healthcare associated pneumonia And pulmonary embolism  Pulmonary embolism cannot anticoagulates in view of hematuria, status post IVC filter placement : DVT ruled out in the lower extremities    HCAP;on Rocephin;o2.not tolerating the BiPAP at night. Chest x-ray with possible infiltrate On  Rocephin,o2.continue duonebs for wheezing,rpt chest xray today. Monitor WBC closely, WBC at 21.6 -13.6-7.7  #Sepsis and positive blood cultures and urine cultures Blood cultures are positive with Escherichia coli and Enterobacter,  Escherichia coli is pansensitive to all antibiotics Urine cultures are positive with Escherichia coli pansensitive Change meropenem  to Rocephin  Rpt blood cultures  Ordered follow-up after resolution of infection. There are pending.  #Acute cystitis E coli Pansensitive, changed to Rocephin  #. Hematuria: Secondary to radiation cystitis/hemorrhagic cystitis' Followed by urology,will follow their recommendation. Voiding trial if possible  Acute on chronic  anemia:one unit of prbc  Transfusion.  #. Essential hypertension: Blood pressure is acceptable. Continue Norvasc, lisinopril and HCTZ  #. BPH: Continue Hytrin  #. GERD: Continue PPI   Physical therapy is recommending skilled nursing care,  All the records are reviewed and case discussed with Care Management/Social Workerr. Management plans discussed with the patient, wife and they are in agreement.  CODE STATUS: fc  TOTAL critical TIME TAKING CARE OF THIS PATIENT: 35 minutes.   POSSIBLE D/C IN 2=3 DAYS, DEPENDING ON CLINICAL CONDITION.  Note: This dictation was prepared with Dragon dictation along with smaller phrase technology. Any transcriptional errors that result from this process are unintentional.   Pamala Hayman M.D on 12/31/2015 at 6:57 AM  Between 7am to 6pm - Pager - [phone] After 6pm go to [email or web address] - password [email or web address] [facility]  Office  [phone]  CC: Primary care physician; BABAOFF, Caryl Bis, MD

## 2015-12-31 NOTE — Clinical Social Work Note (Signed)
Clinical Social Work Assessment  Patient Details  Name: Geoffrey Barber MRN: [id number] Date of Birth: 1925/02/13  Date of referral:  12/31/15               Reason for consult:  Facility Placement                Permission sought to share information with:  Family Supports Permission granted to share information::  Yes, Verbal Permission Granted  Name::     Kennet Mccort, wife   Housing/Transportation Living arrangements for the past 2 months:  Apartment Source of Information:  Patient, Engineer, materials, Spouse Patient Interpreter Needed:  None Criminal Activity/Legal Involvement Pertinent to Current Situation/Hospitalization:  No - Comment as needed Significant Relationships:  Spouse, Friend Lives with:  Spouse Do you feel safe going back to the place where you live?  Yes Need for family participation in patient care:  Yes (Comment)  Care giving concerns:  No care giving needs identified.   Social Worker assessment / plan:  CSW met with pt to address consult. CSW introduced herself and explained role of social work. CSW also explained the process of discharging to SNF. Pt was admitted from [facility]. Pt lives at the [facility]. Pt is agreeable to return to facility. Facility is able to accept pt at discharge. CSW sent referral. CSW will continue to follow.    Employment status:  Retired Forensic scientist:  Medicare PT Recommendations:  [place] / Referral to community resources:  [place]  Patient/Family's Response to care:  Pt was appreciative of CSW support.   Patient/Family's Understanding of and Emotional Response to Diagnosis, Current Treatment, and Prognosis:  Pt would like to return to facility to continue to his STR.   Emotional Assessment Appearance:  Appears stated age Attitude/Demeanor/Rapport:  Other (Appropriate) Affect (typically observed):  Accepting, Adaptable, Pleasant Orientation:   Oriented to Self, Oriented to Place, Oriented to  Time, Oriented to Situation Alcohol / Substance use:  Never Used Psych involvement (Current and /or in the community):  No (Comment)  Discharge Needs  Concerns to be addressed:  Adjustment to Illness Readmission within the last 30 days:  Yes Current discharge risk:  Chronically ill Barriers to Discharge:  Continued Medical Work up   Darden Dates, LCSW 12/31/2015, 3:53 PM

## 2015-12-31 NOTE — Progress Notes (Signed)
Urology Consult Follow Up  Subjective: CBI stopped several hours this AM, strawberry koolaid color with scant clot.  Hgb stable.  Anti-infectives: Anti-infectives    Start     Dose/Rate Route Frequency Ordered Stop   12/30/15 1000  cefTRIAXone (ROCEPHIN) 2 g in dextrose 5 % 50 mL IVPB     2 g 100 mL/hr over 30 Minutes Intravenous Every 24 hours 12/30/15 0811     12/29/15 1445  cefTRIAXone (ROCEPHIN) 1 g in dextrose 5 % 50 mL IVPB  Status:  Discontinued     1 g 100 mL/hr over 30 Minutes Intravenous Every 24 hours 12/29/15 1434 12/30/15 0811   12/27/15 2000  meropenem (MERREM) 1 g in sodium chloride 0.9 % 100 mL IVPB  Status:  Discontinued     1 g 200 mL/hr over 30 Minutes Intravenous Every 8 hours 12/27/15 1401 12/29/15 1434   12/27/15 1900  vancomycin (VANCOCIN) 1,250 mg in sodium chloride 0.9 % 250 mL IVPB  Status:  Discontinued     1,250 mg 166.7 mL/hr over 90 Minutes Intravenous Every 18 hours 12/26/15 1614 12/27/15 1348   12/27/15 1015  ceFAZolin (ANCEF) IVPB 2g/100 mL premix     2 g 200 mL/hr over 30 Minutes Intravenous On call to O.R. 12/27/15 1001 12/28/15 0559   12/27/15 0300  meropenem (MERREM) 2 g in sodium chloride 0.9 % 100 mL IVPB  Status:  Discontinued     2 g 200 mL/hr over 30 Minutes Intravenous Every 8 hours 12/27/15 0246 12/27/15 1401   12/27/15 0100  vancomycin (VANCOCIN) 1,250 mg in sodium chloride 0.9 % 250 mL IVPB     1,250 mg 166.7 mL/hr over 90 Minutes Intravenous  Once 12/26/15 1614 12/27/15 0410   12/26/15 2100  levofloxacin (LEVAQUIN) IVPB 750 mg  Status:  Discontinued     750 mg 100 mL/hr over 90 Minutes Intravenous Every 24 hours 12/26/15 2051 12/27/15 0246   12/26/15 1630  vancomycin (VANCOCIN) 1,250 mg in sodium chloride 0.9 % 250 mL IVPB     1,250 mg 166.7 mL/hr over 90 Minutes Intravenous  Once 12/26/15 1613 12/26/15 1943   12/26/15 1530  piperacillin-tazobactam (ZOSYN) IVPB 3.375 g  Status:  Discontinued     3.375 g 12.5 mL/hr over 240 Minutes  Intravenous Every 8 hours 12/26/15 1523 12/27/15 0246   12/25/15 1630  dapsone tablet 25 mg     25 mg Oral Every other day 12/25/15 1623        Current Facility-Administered Medications  Medication Dose Route Frequency Provider Last Rate Last Dose  . acetaminophen (TYLENOL) tablet 650 mg  650 mg Oral Q6H PRN Bettey Costa, MD   650 mg at 12/27/15 2044  . amLODipine (NORVASC) tablet 5 mg  5 mg Oral Daily Bettey Costa, MD   5 mg at 12/31/15 1013  . antiseptic oral rinse (CPC / CETYLPYRIDINIUM CHLORIDE 0.05%) solution 7 mL  7 mL Mouth Rinse BID Nicholes Mango, MD   7 mL at 12/31/15 1036  . cefTRIAXone (ROCEPHIN) 2 g in dextrose 5 % 50 mL IVPB  2 g Intravenous Q24H Epifanio Lesches, MD   2 g at 12/31/15 1013  . cholecalciferol (VITAMIN D) tablet 1,000 Units  1,000 Units Oral Daily Bettey Costa, MD   1,000 Units at 12/31/15 1013  . dapsone tablet 25 mg  25 mg Oral QODAY Sital Mody, MD   25 mg at 12/31/15 1013  . HYDROcodone-acetaminophen (NORCO/VICODIN) 5-325 MG per tablet 1-2 tablet  1-2 tablet Oral  Q4H PRN Bettey Costa, MD   2 tablet at 12/28/15 0037  . ipratropium-albuterol (DUONEB) 0.5-2.5 (3) MG/3ML nebulizer solution 3 mL  3 mL Nebulization Q6H PRN Epifanio Lesches, MD      . lisinopril (PRINIVIL,ZESTRIL) tablet 10 mg  10 mg Oral Daily Bettey Costa, MD   10 mg at 12/31/15 1013  . morphine 2 MG/ML injection 2 mg  2 mg Intravenous Q6H PRN Nicholes Mango, MD      . multivitamin with minerals tablet 1 tablet  1 tablet Oral Daily Bettey Costa, MD   1 tablet at 12/31/15 1013  . multivitamin-lutein (OCUVITE-LUTEIN) capsule 1 capsule  1 capsule Oral BID Bettey Costa, MD   1 capsule at 12/31/15 1013  . ondansetron (ZOFRAN) tablet 4 mg  4 mg Oral Q6H PRN Bettey Costa, MD       Or  . ondansetron (ZOFRAN) injection 4 mg  4 mg Intravenous Q6H PRN Sital Mody, MD      . opium-belladonna (B&O SUPPRETTES) 16.2-60 MG suppository 1 suppository  1 suppository Rectal Q8H PRN Hollice Espy, MD   1 suppository at 12/28/15 0059  .  pantoprazole (PROTONIX) EC tablet 40 mg  40 mg Oral Daily Bettey Costa, MD   40 mg at 12/31/15 1013  . psyllium (HYDROCIL/METAMUCIL) packet 1 packet  1 packet Oral Daily Sital Mody, MD      . senna-docusate (Senokot-S) tablet 1 tablet  1 tablet Oral QHS PRN Bettey Costa, MD   1 tablet at 12/29/15 0421  . terazosin (HYTRIN) capsule 5 mg  5 mg Oral QHS Bettey Costa, MD   5 mg at 12/30/15 2121     Objective: Vital signs in last 24 hours: Temp:  [98 F (36.7 C)-98.6 F (37 C)] 98 F (36.7 C) (06/13 1232) Pulse Rate:  [74-85] 74 (06/13 1232) Resp:  [18-30] 18 (06/13 0455) BP: (114-131)/(59-67) 131/67 mmHg (06/13 1232) SpO2:  [91 %-98 %] 91 % (06/13 1232) FiO2 (%):  [28 %] 28 % (06/13 0751)  Intake/Output from previous day: 06/12 0701 - 06/13 0700 In: 482 [P.O.:120; Blood:362] Out: 13250 [Urine:13250] Intake/Output this shift: Total I/O In: 240 [P.O.:240] Out: -    Physical Exam General: well-developed male, in no acute distress HEENT: Atraumatic, moist membranes Neck: supple, trachea midline Lungs: moving air without restriction and without use of accessory muscles Cardiac: Regular rate Abd: Soft, nontender, nondistended GU: 24 Fr. rusch cath in place with in-flowing saline. Urine strawberry Kool-Aid colored with CBI off x several hours. Ext: No edema Neuro: CN grossly intact. Moves all extremities  Lab Results:   Recent Labs  12/29/15 0434  12/30/15 2020 12/31/15 0508  WBC 6.8  --   --  7.4  HGB 7.2*  < > 8.2* 7.9*  HCT 21.4*  < > 24.3* 23.2*  PLT 195  --   --  232  < > = values in this interval not displayed. BMET  Recent Labs  12/29/15 0434  NA 135  K 3.7  CL 107  CO2 21*  GLUCOSE 109*  BUN 39*  CREATININE 1.09  CALCIUM 7.8*    Assessment: 82 year old male admitted with gross hematuria secondary to radiation cystitis and hemorrhagic infectious cystitis. Hospital course, located by PE status post IVC filter and gram negative rod bacteremia/sepsis. Clinically  improving.  Continues to have ongoing mild hematuria but not clinically significant and has not required blood transfusion.    Patient will likely benefit from hyperbaric oxygen as outpatient.  Plan: -leave CBI off -  Discussed options for ongoing hematuria with the patient, voiding trial today versus tomorrow -Plan for VT in AM -continue appropriate abx for +Ucx/BCx    LOS: 6 days    Hollice Espy 12/31/2015

## 2015-12-31 NOTE — Progress Notes (Signed)
Patient requested to be placed back on 2 lpm [place].  Patient not tolerating mask at this time.  Patient in no distress.

## 2015-12-31 NOTE — Progress Notes (Signed)
Physical Therapy Treatment Patient Details Name: Geoffrey Barber MRN: [id number] DOB: Apr 18, 1925 Today's Date: 12/31/2015    History of Present Illness Geoffrey Barber  is a 82 y.o. male with a known history of Prostate cancer, urethral stricture who was discharged about a week ago for hematuria. Patient was discharged to skilled nursing facility and presents today with hematuria. During last hospitalization patient had a CT scan which showed debris in the bladder which could be a mass or focal hemorrhage. Plavix was discontinued. Patient was seen by urology during that hospitalization. At discharge patient's hematuria had resolved. Dr. Erlene Quan was called by the ER physician and recommendations were to continue CBI. Plan is to follow-up for outpatient cystoscopy. Pt reports 2 falls in the last 12 months.    PT Comments    Pt demonstrates improved mobility compared to yesterday. He remains profoundly weak requiring assistance for balance during transfers and ambulation. He is able to significantly increase his ambulation distance on this date making it to the nurse station and back to the room with one seated rest break. VSS during ambulation and pt reporting 3/10 on 0-10 RPE scale. Pt will need SNF placement at discharge in order to facilitate safe return to prior level of function at home.   Follow Up Recommendations  SNF     Equipment Recommendations  Rolling walker with 5" wheels (has own RW)    Recommendations for Other Services       Precautions / Restrictions Precautions Precautions: Fall Precaution Comments: Mod Restrictions Weight Bearing Restrictions: No    Mobility  Bed Mobility Overal bed mobility: Needs Assistance Bed Mobility: Supine to Sit     Supine to sit: Mod assist     General bed mobility comments: Pt continues to require considerable assistance for LE as well as trunk righting  Transfers Overall transfer level: Needs assistance Equipment used: Rolling  walker (2 wheeled) Transfers: Sit to/from Stand Sit to Stand: Min assist         General transfer comment: Pt demonstrates slightly improved strength with sit to stand transfers. He continues to requires extended time due to LE weakness and poor balance  Ambulation/Gait Ambulation/Gait assistance: Min assist Ambulation Distance (Feet): 80 Feet (40+40) Assistive device: Rolling walker (2 wheeled)   Gait velocity: Decreased Gait velocity interpretation: Below normal speed for age/gender General Gait Details: Pt continues to take short shuffling steps. He demonstrates significantly decreased gait speed and forward flexed posture. Cues for upright posture and to keep center of  mass inside boundaries of walker. Gait continues to slow as distance increases. Vitals monitored during ambulation. HR increases to 98 bpm during ambulation. SaO2 remains at or above 94% on 2L/min supplemental O2. Pt reports 3/10 fatigue on RPE (0-10). Pt provided seated rest break halfway through ambulation   Stairs            Wheelchair Mobility    Modified Rankin (Stroke Patients Only)       Balance Overall balance assessment: Needs assistance Sitting-balance support: No upper extremity supported Sitting balance-Leahy Scale: Good     Standing balance support: Bilateral upper extremity supported Standing balance-Leahy Scale: Poor                      Cognition Arousal/Alertness: Awake/alert Behavior During Therapy: WFL for tasks assessed/performed Overall Cognitive Status: Within Functional Limits for tasks assessed  Exercises      General Comments General comments (skin integrity, edema, etc.): Foley with bloody urine. Appears slightly lighter than yesterday      Pertinent Vitals/Pain Pain Assessment: No/denies pain    Home Living                      Prior Function            PT Goals (current goals can now be found in the care plan  section) Acute Rehab PT Goals Patient Stated Goal: Return to prior function at home PT Goal Formulation: With patient Time For Goal Achievement: 01/09/16 Potential to Achieve Goals: Fair Progress towards PT goals: Progressing toward goals    Frequency  Min 2X/week    PT Plan Current plan remains appropriate    Co-evaluation             End of Session Equipment Utilized During Treatment: Gait belt Activity Tolerance: Patient tolerated treatment well Patient left: with call bell/phone within reach;in chair;with nursing/sitter in room;with family/visitor present     Time: [id number] PT Time Calculation (min) (ACUTE ONLY): 28 min  Charges:  $Gait Training: 23-37 mins                    G Codes:      Lyndel Safe Huprich PT, DPT   Huprich,Jason 12/31/2015, 10:25 AM

## 2016-01-01 ENCOUNTER — Other Ambulatory Visit: Payer: Medicare Other | Admitting: Urology

## 2016-01-01 ENCOUNTER — Inpatient Hospital Stay: Payer: Medicare Other

## 2016-01-01 NOTE — Progress Notes (Signed)
Dr Erlene Quan made aware that pt with bladder scan 256ml, fluids encouraged, per Dr Erlene Quan rescan pt at midnight if no voids, if result >400 page her

## 2016-01-01 NOTE — Progress Notes (Signed)
[facility] NAME: Geoffrey Barber    MR#:  [id number]  DATE OF BIRTH:  January 11, 1925  SUBJECTIVE: CBI is clamped since yesterday evening.more hematuria noted.no there complaints.  CHIEF COMPLAINT:   REVIEW OF SYSTEMS:  CONSTITUTIONAL: No fever, fatigue or weakness.  EYES: No blurred or double vision.  EARS, NOSE, AND THROAT: No tinnitus or ear pain.  RESPIRATORY: No cough, shortness of breath, denies wheezing or hemoptysis.  CARDIOVASCULAR: No chest pain, orthopnea, edema.  GASTROINTESTINAL: No nausea, vomiting, diarrhea or abdominal pain.  GENITOURINARY: No dysuria,Has hematuria.  ENDOCRINE: No polyuria, nocturia,  HEMATOLOGY: No anemia, easy bruising or bleeding SKIN: No rash or lesion. MUSCULOSKELETAL: No joint pain or arthritis.   NEUROLOGIC: No tingling, numbness, weakness.  PSYCHIATRY: No anxiety or depression.   DRUG ALLERGIES:  No Known Allergies  VITALS:  Blood pressure 119/65, pulse 89, temperature 99.6 F (37.6 C), temperature source Oral, resp. rate 20, height 6' (1.829 m), weight 97.523 kg (215 lb), SpO2 98 %.  PHYSICAL EXAMINATION:  GENERAL:  82 y.o.-year-old patient lying in the bed with no acute distress.  EYES: Pupils equal, round, reactive to light and accommodation. No scleral icterus. Extraocular muscles intact.  HEENT: Head atraumatic, normocephalic. Oropharynx and nasopharynx clear.  NECK:  Supple, no jugular venous distention. No thyroid enlargement, no tenderness.  LUNGS: clear to  Auscultation bilaterally..no rales,rhonchi or crepitation. No use of accessory muscles of respiration. Off BiPAP  CARDIOVASCULAR: S1, S2 normal. No murmurs, rubs, or gallops.  ABDOMEN: Soft, nontender, nondistended. Bowel sounds present. No organomegaly or mass. Foley catheter with bloody urine  EXTREMITIES: No pedal edema, cyanosis, or clubbing.  NEUROLOGIC: Cranial nerves II through XII are intact. Muscle strength 5/5 in  all extremities. Sensation intact. Gait not checked.  PSYCHIATRIC: The patient is alert and oriented x 3.  SKIN: No obvious rash, lesion, or ulcer.    LABORATORY PANEL:   CBC  Recent Labs Lab 12/31/15 0508  WBC 7.4  HGB 7.9*  HCT 23.2*  PLT 232   ------------------------------------------------------------------------------------------------------------------  Chemistries   Recent Labs Lab 12/29/15 0434  NA 135  K 3.7  CL 107  CO2 21*  GLUCOSE 109*  BUN 39*  CREATININE 1.09  CALCIUM 7.8*   ------------------------------------------------------------------------------------------------------------------  Cardiac Enzymes  Recent Labs Lab 12/27/15 1817  TROPONINI 0.04*   ------------------------------------------------------------------------------------------------------------------  RADIOLOGY:  No results found.  EKG:   Orders placed or performed in visit on 02/24/12  . 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   #Shortness of breath secondary to sepsis from healthcare associated pneumonia And pulmonary embolism  Pulmonary embolism cannot anticoagulates in view of hematuria, status post IVC filter placement : DVT ruled out in the lower extremities    HCAP;on Rocephin;o2.not tolerating the BiPAP at night. Chest x-ray with possible infiltrate On Rocephin,o2.continue duonebs for wheezing,rpt chest xray today.  follow up. Monitor WBC closely, WBC at 21.6 -13.6-7.7  #Sepsis and positive blood cultures and urine cultures Blood cultures are positive with Escherichia coli and Enterobacter, Escherichia coli is pansensitive to all antibiotics Urine cultures are positive with Escherichia coli pansensitive Change meropenem  to Rocephin  Rpt blood cultures  Ordered follow-up after resolution of infection. There are pending.  #Acute  cystitis E coli Pansensitive, changed to Rocephin  #. Hematuria: Secondary to radiation cystitis/hemorrhagic cystitis' Followed by urology,d/c  foley today and voiding trail as per urology recommendation S/p CBI  Acute on chronic  Anemia: with acute anemia of blood loss due to hematuria;s/p one unit PRBC  #. Essential hypertension: Blood pressure is acceptable. Continue Norvasc, lisinopril and HCTZ  #. BPH: Continue Hytrin  #. GERD: Continue PPI   Physical therapy is recommending skilled nursing care,  All the records are reviewed and case discussed with Care Management/Social Workerr. Management plans discussed with the patient, wife and they are in agreement.  CODE STATUS: fc  TOTAL critical TIME TAKING CARE OF THIS PATIENT: 35 minutes.   POSSIBLE D/C IN 2=3 DAYS, DEPENDING ON CLINICAL CONDITION.  Note: This dictation was prepared with Dragon dictation along with smaller phrase technology. Any transcriptional errors that result from this process are unintentional.   Pamalee Marcoe M.D on 01/01/2016 at 11:29 AM  Between 7am to 6pm - Pager - [phone] After 6pm go to [email or web address] - password [email or web address] [facility]  Office  [phone]  CC: Primary care physician; BABAOFF, Caryl Bis, MD

## 2016-01-01 NOTE — Progress Notes (Addendum)
Urology Consult Follow Up  Subjective: CBI remained off now for approximately 24 hours. Urine free punch colored with occasional small clots. Discussed options with patient and he is elected to have the catheter removed today.  Anti-infectives: Anti-infectives    Start     Dose/Rate Route Frequency Ordered Stop   12/30/15 1000  cefTRIAXone (ROCEPHIN) 2 g in dextrose 5 % 50 mL IVPB     2 g 100 mL/hr over 30 Minutes Intravenous Every 24 hours 12/30/15 0811     12/29/15 1445  cefTRIAXone (ROCEPHIN) 1 g in dextrose 5 % 50 mL IVPB  Status:  Discontinued     1 g 100 mL/hr over 30 Minutes Intravenous Every 24 hours 12/29/15 1434 12/30/15 0811   12/27/15 2000  meropenem (MERREM) 1 g in sodium chloride 0.9 % 100 mL IVPB  Status:  Discontinued     1 g 200 mL/hr over 30 Minutes Intravenous Every 8 hours 12/27/15 1401 12/29/15 1434   12/27/15 1900  vancomycin (VANCOCIN) 1,250 mg in sodium chloride 0.9 % 250 mL IVPB  Status:  Discontinued     1,250 mg 166.7 mL/hr over 90 Minutes Intravenous Every 18 hours 12/26/15 1614 12/27/15 1348   12/27/15 1015  ceFAZolin (ANCEF) IVPB 2g/100 mL premix     2 g 200 mL/hr over 30 Minutes Intravenous On call to O.R. 12/27/15 1001 12/28/15 0559   12/27/15 0300  meropenem (MERREM) 2 g in sodium chloride 0.9 % 100 mL IVPB  Status:  Discontinued     2 g 200 mL/hr over 30 Minutes Intravenous Every 8 hours 12/27/15 0246 12/27/15 1401   12/27/15 0100  vancomycin (VANCOCIN) 1,250 mg in sodium chloride 0.9 % 250 mL IVPB     1,250 mg 166.7 mL/hr over 90 Minutes Intravenous  Once 12/26/15 1614 12/27/15 0410   12/26/15 2100  levofloxacin (LEVAQUIN) IVPB 750 mg  Status:  Discontinued     750 mg 100 mL/hr over 90 Minutes Intravenous Every 24 hours 12/26/15 2051 12/27/15 0246   12/26/15 1630  vancomycin (VANCOCIN) 1,250 mg in sodium chloride 0.9 % 250 mL IVPB     1,250 mg 166.7 mL/hr over 90 Minutes Intravenous  Once 12/26/15 1613 12/26/15 1943   12/26/15 1530   piperacillin-tazobactam (ZOSYN) IVPB 3.375 g  Status:  Discontinued     3.375 g 12.5 mL/hr over 240 Minutes Intravenous Every 8 hours 12/26/15 1523 12/27/15 0246   12/25/15 1630  dapsone tablet 25 mg     25 mg Oral Every other day 12/25/15 1623        Current Facility-Administered Medications  Medication Dose Route Frequency Provider Last Rate Last Dose  . acetaminophen (TYLENOL) tablet 650 mg  650 mg Oral Q6H PRN Bettey Costa, MD   650 mg at 12/27/15 2044  . amLODipine (NORVASC) tablet 5 mg  5 mg Oral Daily Bettey Costa, MD   5 mg at 12/31/15 1013  . antiseptic oral rinse (CPC / CETYLPYRIDINIUM CHLORIDE 0.05%) solution 7 mL  7 mL Mouth Rinse BID Nicholes Mango, MD   7 mL at 12/31/15 2009  . cefTRIAXone (ROCEPHIN) 2 g in dextrose 5 % 50 mL IVPB  2 g Intravenous Q24H Epifanio Lesches, MD   2 g at 12/31/15 1013  . cholecalciferol (VITAMIN D) tablet 1,000 Units  1,000 Units Oral Daily Bettey Costa, MD   1,000 Units at 12/31/15 1013  . dapsone tablet 25 mg  25 mg Oral QODAY Bettey Costa, MD   25 mg at 12/31/15  1013  . HYDROcodone-acetaminophen (NORCO/VICODIN) 5-325 MG per tablet 1-2 tablet  1-2 tablet Oral Q4H PRN Bettey Costa, MD   2 tablet at 12/28/15 0037  . ipratropium-albuterol (DUONEB) 0.5-2.5 (3) MG/3ML nebulizer solution 3 mL  3 mL Nebulization Q6H PRN Epifanio Lesches, MD   3 mL at 12/31/15 1504  . lisinopril (PRINIVIL,ZESTRIL) tablet 10 mg  10 mg Oral Daily Bettey Costa, MD   10 mg at 12/31/15 1013  . morphine 2 MG/ML injection 2 mg  2 mg Intravenous Q6H PRN Nicholes Mango, MD      . multivitamin with minerals tablet 1 tablet  1 tablet Oral Daily Bettey Costa, MD   1 tablet at 12/31/15 1013  . multivitamin-lutein (OCUVITE-LUTEIN) capsule 1 capsule  1 capsule Oral BID Bettey Costa, MD   1 capsule at 12/31/15 2007  . ondansetron (ZOFRAN) tablet 4 mg  4 mg Oral Q6H PRN Bettey Costa, MD       Or  . ondansetron (ZOFRAN) injection 4 mg  4 mg Intravenous Q6H PRN Sital Mody, MD      . opium-belladonna (B&O  SUPPRETTES) 16.2-60 MG suppository 1 suppository  1 suppository Rectal Q8H PRN Hollice Espy, MD   1 suppository at 12/28/15 0059  . pantoprazole (PROTONIX) EC tablet 40 mg  40 mg Oral Daily Bettey Costa, MD   40 mg at 12/31/15 1013  . psyllium (HYDROCIL/METAMUCIL) packet 1 packet  1 packet Oral Daily Sital Mody, MD      . senna-docusate (Senokot-S) tablet 1 tablet  1 tablet Oral QHS PRN Bettey Costa, MD   1 tablet at 12/29/15 0421  . terazosin (HYTRIN) capsule 5 mg  5 mg Oral QHS Bettey Costa, MD   5 mg at 12/31/15 2007     Objective: Vital signs in last 24 hours: Temp:  [98 F (36.7 C)-99.8 F (37.7 C)] 99.6 F (37.6 C) (06/14 0830) Pulse Rate:  [74-100] 89 (06/14 0830) Resp:  [18-20] 20 (06/14 0830) BP: (114-140)/(62-69) 119/65 mmHg (06/14 0830) SpO2:  [91 %-98 %] 98 % (06/14 0441) FiO2 (%):  [28 %] 28 % (06/13 1504)  Intake/Output from previous day: 06/13 0701 - 06/14 0700 In: 720 [P.O.:720] Out: 2300 [Urine:2300] Intake/Output this shift:     Physical Exam General: well-developed male, in no acute distress HEENT: Atraumatic, moist membranes Neck: supple, trachea midline Lungs: moving air without restriction and without use of accessory muscles Cardiac: Regular rate Abd: Soft, nontender, nondistended GU: 24 Fr. rusch cath in place With free punch-colored urine, scant clots. Neuro: CN grossly intact. Moves all extremities  Lab Results:   Recent Labs  12/30/15 2020 12/31/15 0508  WBC  --  7.4  HGB 8.2* 7.9*  HCT 24.3* 23.2*  PLT  --  232   Assessment: 82 year old male admitted with gross hematuria secondary to radiation cystitis and hemorrhagic infectious cystitis. Hospital course, located by PE status post IVC filter and gram negative rod bacteremia/sepsis.  Continues to have ongoing mild hematuria but not clinically significant.  Has ony required 1u blood transfusion, Hbg stable.    Discussed options with patient moving 4. Unfortunately given his recent PE and  overall pulmonary status, do not feel that he is a candidate for fulguration in the operating room especially given that his hemoglobin is remaining relatively stable and bleeding is not severe. We also discussed alum in the CBI although the this can lead to worsening of irritative voiding symptoms. Finally, we discussed the possibility of stopping CBI, removing his Foley catheter  today to see if the catheter is contributing to his ongoing hematuria by your taking the lining of his bladder.  Plan: -Remove Foley this morning -Consider replacement if patient unable to void later today -continue appropriate abx for +Ucx/BCx -Patient will likely benefit from hyperbaric oxygen as outpatient -outpt cysto    LOS: 7 days    Hollice Espy 01/01/2016

## 2016-01-01 NOTE — Plan of Care (Signed)
Problem: Fluid Volume: Goal: Ability to maintain a balanced intake and output will improve Outcome: Progressing CBI clamped this shift per order, foley irrigated this shift due to clots, pt tolerated well.

## 2016-01-01 NOTE — Progress Notes (Signed)
Dr Erlene Quan made aware that foley removed at 1030am, pt has tried multiple times to void and states that he is starting to feel like his bladder is slightly full, bladder scan 22ml in bladder, MD states to rescan pt around 1830 if no void

## 2016-01-02 DIAGNOSIS — R935 Abnormal findings on diagnostic imaging of other abdominal regions, including retroperitoneum: Secondary | ICD-10-CM

## 2016-01-02 MED ORDER — BELLADONNA ALKALOIDS-OPIUM 16.2-60 MG RE SUPP: 1.0000 | Freq: Three times a day (TID) | RECTAL | Status: DC | PRN

## 2016-01-02 MED ORDER — FERROUS SULFATE 325 (65 FE) MG PO TABS: 325.0000 mg | ORAL_TABLET | Freq: Two times a day (BID) | ORAL | Status: DC

## 2016-01-02 MED ORDER — FLUTICASONE-SALMETEROL 250-50 MCG/DOSE IN AEPB: 1.0000 | INHALATION_SPRAY | Freq: Two times a day (BID) | RESPIRATORY_TRACT | Status: DC

## 2016-01-02 MED ORDER — HYDROCODONE-ACETAMINOPHEN 5-325 MG PO TABS: 1.0000 | ORAL_TABLET | ORAL | Status: DC | PRN

## 2016-01-02 MED ORDER — IPRATROPIUM-ALBUTEROL 0.5-2.5 (3) MG/3ML IN SOLN: 3.0000 mL | Freq: Four times a day (QID) | RESPIRATORY_TRACT | Status: DC | PRN

## 2016-01-02 MED ORDER — CEPHALEXIN 500 MG PO CAPS: 500.0000 mg | ORAL_CAPSULE | Freq: Three times a day (TID) | ORAL | Status: DC

## 2016-01-02 NOTE — Progress Notes (Signed)
Patient discharged to [facility]. Report given to RN at facility. EMS called for transportation.

## 2016-01-02 NOTE — Progress Notes (Signed)
Physical Therapy Treatment Patient Details Name: Geoffrey Barber MRN: [id number] DOB: [birth year] Today's Date: 01/02/2016    History of Present Illness Geoffrey Barber  is a 82 y.o. male with a known history of Prostate cancer, urethral stricture who was discharged about a week ago for hematuria. Patient was discharged to skilled nursing facility and presents today with hematuria. During last hospitalization patient had a CT scan which showed debris in the bladder which could be a mass or focal hemorrhage. Plavix was discontinued. Patient was seen by urology during that hospitalization. At discharge patient's hematuria had resolved. Dr. Erlene Quan was called by the ER physician and recommendations were to continue CBI. Plan is to follow-up for outpatient cystoscopy. Pt reports 2 falls in the last 12 months.    PT Comments    Pt in chair ready for session.  Awaiting return to SNF today.  Stood with min a x 1 and was able to ambulate around nursing unit 160' with heavy reliance on walker and min a x 1.  +2 assist for recliner and O2 management.  Sats remained at 97% during gait and HR at 103 after completion.  Pt with slow steady gait with vc's for proper breathing.    Follow Up Recommendations  SNF     Equipment Recommendations  Rolling walker with 5" wheels    Recommendations for Other Services       Precautions / Restrictions Precautions Precautions: Fall Precaution Comments: Mod Restrictions Weight Bearing Restrictions: No    Mobility  Bed Mobility                  Transfers Overall transfer level: Needs assistance Equipment used: Rolling walker (2 wheeled) Transfers: Sit to/from Stand Sit to Stand: Min assist            Ambulation/Gait Ambulation/Gait assistance: Min assist Ambulation Distance (Feet): 160 Feet Assistive device: Rolling walker (2 wheeled) Gait Pattern/deviations: Step-through pattern;Decreased step length - right;Decreased step length - left (dec  step height bilaterally) Gait velocity: Decreased Gait velocity interpretation: <1.8 ft/sec, indicative of risk for recurrent falls General Gait Details: Pt continues to take short shuffling steps with slow gait speed.  heavy reliance of walker    Stairs            Wheelchair Mobility    Modified Rankin (Stroke Patients Only)       Balance Overall balance assessment: Needs assistance         Standing balance support: Bilateral upper extremity supported Standing balance-Leahy Scale: Poor                      Cognition Arousal/Alertness: Awake/alert Behavior During Therapy: WFL for tasks assessed/performed Overall Cognitive Status: Within Functional Limits for tasks assessed                      Exercises General Exercises - Lower Extremity Ankle Circles/Pumps: AROM;Both;20 reps;Seated Long Arc Quad: Strengthening;Both;10 reps;Seated Heel Slides: Strengthening;Both;10 reps;Seated Hip Flexion/Marching: Strengthening;Both;10 reps;Seated    General Comments        Pertinent Vitals/Pain Pain Assessment: No/denies pain    Home Living                      Prior Function            PT Goals (current goals can now be found in the care plan section) Progress towards PT goals: Progressing toward goals    Frequency  Min  2X/week    PT Plan Current plan remains appropriate    Co-evaluation             End of Session Equipment Utilized During Treatment: Gait belt Activity Tolerance: Patient tolerated treatment well Patient left: with call bell/phone within reach;in chair;with nursing/sitter in room;with family/visitor present     Time: 1100-1130 PT Time Calculation (min) (ACUTE ONLY): 30 min  Charges:  $Gait Training: 8-22 mins $Therapeutic Exercise: 8-22 mins                    G Codes:      Chesley Noon, PTA 01/02/2016, 11:39 AM

## 2016-01-02 NOTE — Progress Notes (Signed)
Urology Consult Follow Up  Subjective: Patient sitting comfortably in bed.  He is eating breakfast.  States he is urinating on his own.  His first few voids when the catheter was removed were bloody, but now he states his most recent voids have been clear yellow.  He has the urinal between his legs so that he does not have to stand up to urinate.  Bladder scan to be performed later morning.  Anti-infectives: Anti-infectives    Start     Dose/Rate Route Frequency Ordered Stop   12/30/15 1000  cefTRIAXone (ROCEPHIN) 2 g in dextrose 5 % 50 mL IVPB     2 g 100 mL/hr over 30 Minutes Intravenous Every 24 hours 12/30/15 0811     12/29/15 1445  cefTRIAXone (ROCEPHIN) 1 g in dextrose 5 % 50 mL IVPB  Status:  Discontinued     1 g 100 mL/hr over 30 Minutes Intravenous Every 24 hours 12/29/15 1434 12/30/15 0811   12/27/15 2000  meropenem (MERREM) 1 g in sodium chloride 0.9 % 100 mL IVPB  Status:  Discontinued     1 g 200 mL/hr over 30 Minutes Intravenous Every 8 hours 12/27/15 1401 12/29/15 1434   12/27/15 1900  vancomycin (VANCOCIN) 1,250 mg in sodium chloride 0.9 % 250 mL IVPB  Status:  Discontinued     1,250 mg 166.7 mL/hr over 90 Minutes Intravenous Every 18 hours 12/26/15 1614 12/27/15 1348   12/27/15 1015  ceFAZolin (ANCEF) IVPB 2g/100 mL premix     2 g 200 mL/hr over 30 Minutes Intravenous On call to O.R. 12/27/15 1001 12/28/15 0559   12/27/15 0300  meropenem (MERREM) 2 g in sodium chloride 0.9 % 100 mL IVPB  Status:  Discontinued     2 g 200 mL/hr over 30 Minutes Intravenous Every 8 hours 12/27/15 0246 12/27/15 1401   12/27/15 0100  vancomycin (VANCOCIN) 1,250 mg in sodium chloride 0.9 % 250 mL IVPB     1,250 mg 166.7 mL/hr over 90 Minutes Intravenous  Once 12/26/15 1614 12/27/15 0410   12/26/15 2100  levofloxacin (LEVAQUIN) IVPB 750 mg  Status:  Discontinued     750 mg 100 mL/hr over 90 Minutes Intravenous Every 24 hours 12/26/15 2051 12/27/15 0246   12/26/15 1630  vancomycin (VANCOCIN)  1,250 mg in sodium chloride 0.9 % 250 mL IVPB     1,250 mg 166.7 mL/hr over 90 Minutes Intravenous  Once 12/26/15 1613 12/26/15 1943   12/26/15 1530  piperacillin-tazobactam (ZOSYN) IVPB 3.375 g  Status:  Discontinued     3.375 g 12.5 mL/hr over 240 Minutes Intravenous Every 8 hours 12/26/15 1523 12/27/15 0246   12/25/15 1630  dapsone tablet 25 mg     25 mg Oral Every other day 12/25/15 1623        Current Facility-Administered Medications  Medication Dose Route Frequency Provider Last Rate Last Dose  . acetaminophen (TYLENOL) tablet 650 mg  650 mg Oral Q6H PRN Bettey Costa, MD   650 mg at 12/27/15 2044  . amLODipine (NORVASC) tablet 5 mg  5 mg Oral Daily Bettey Costa, MD   5 mg at 01/01/16 0953  . antiseptic oral rinse (CPC / CETYLPYRIDINIUM CHLORIDE 0.05%) solution 7 mL  7 mL Mouth Rinse BID Nicholes Mango, MD   7 mL at 01/01/16 2000  . cefTRIAXone (ROCEPHIN) 2 g in dextrose 5 % 50 mL IVPB  2 g Intravenous Q24H Epifanio Lesches, MD   2 g at 01/01/16 0955  . cholecalciferol (VITAMIN  D) tablet 1,000 Units  1,000 Units Oral Daily Bettey Costa, MD   1,000 Units at 01/01/16 [phone]  . dapsone tablet 25 mg  25 mg Oral QODAY Sital Mody, MD   25 mg at 12/31/15 1013  . HYDROcodone-acetaminophen (NORCO/VICODIN) 5-325 MG per tablet 1-2 tablet  1-2 tablet Oral Q4H PRN Bettey Costa, MD   2 tablet at 12/28/15 0037  . ipratropium-albuterol (DUONEB) 0.5-2.5 (3) MG/3ML nebulizer solution 3 mL  3 mL Nebulization Q6H PRN Epifanio Lesches, MD   3 mL at 12/31/15 1504  . lisinopril (PRINIVIL,ZESTRIL) tablet 10 mg  10 mg Oral Daily Bettey Costa, MD   10 mg at 01/01/16 0952  . morphine 2 MG/ML injection 2 mg  2 mg Intravenous Q6H PRN Nicholes Mango, MD      . multivitamin with minerals tablet 1 tablet  1 tablet Oral Daily Bettey Costa, MD   1 tablet at 01/01/16 0953  . multivitamin-lutein (OCUVITE-LUTEIN) capsule 1 capsule  1 capsule Oral BID Bettey Costa, MD   1 capsule at 01/01/16 2000  . ondansetron (ZOFRAN) tablet 4 mg  4 mg  Oral Q6H PRN Bettey Costa, MD       Or  . ondansetron (ZOFRAN) injection 4 mg  4 mg Intravenous Q6H PRN Sital Mody, MD      . opium-belladonna (B&O SUPPRETTES) 16.2-60 MG suppository 1 suppository  1 suppository Rectal Q8H PRN Hollice Espy, MD   1 suppository at 12/28/15 0059  . pantoprazole (PROTONIX) EC tablet 40 mg  40 mg Oral Daily Bettey Costa, MD   40 mg at 01/01/16 0953  . psyllium (HYDROCIL/METAMUCIL) packet 1 packet  1 packet Oral Daily Sital Mody, MD      . senna-docusate (Senokot-S) tablet 1 tablet  1 tablet Oral QHS PRN Bettey Costa, MD   1 tablet at 12/29/15 0421  . terazosin (HYTRIN) capsule 5 mg  5 mg Oral QHS Sital Mody, MD   5 mg at 01/01/16 2000     Objective: Vital signs in last 24 hours: Temp:  [97.9 F (36.6 C)-99.6 F (37.6 C)] 97.9 F (36.6 C) (06/15 0525) Pulse Rate:  [84-89] 84 (06/15 0525) Resp:  [18-22] 20 (06/15 0525) BP: (116-130)/(65-73) 130/69 mmHg (06/15 0525) SpO2:  [98 %-99 %] 99 % (06/15 0525)  Intake/Output from previous day: 06/14 0701 - 06/15 0700 In: 720 [P.O.:720] Out: 1805 [Urine:1805] Intake/Output this shift:     Physical Exam Constitutional: Well nourished. Alert and oriented, No acute distress. HEENT: [place] AT, moist mucus membranes. Trachea midline, no masses. Cardiovascular: No clubbing, cyanosis, or edema. Respiratory: Normal respiratory effort, no increased work of breathing. GI: Abdomen is soft, non tender, non distended, no abdominal masses. Liver and spleen not palpable.  No hernias appreciated.  Stool sample for occult testing is not indicated.   GU: No CVA tenderness.  No bladder fullness or masses.   Skin: No rashes, bruises or suspicious lesions. Lymph: No cervical or inguinal adenopathy. Neurologic: Grossly intact, no focal deficits, moving all 4 extremities. Psychiatric: Normal mood and affect.  Lab Results:   Recent Labs  12/30/15 2020 12/31/15 0508  WBC  --  7.4  HGB 8.2* 7.9*  HCT 24.3* 23.2*  PLT  --  232    BMET No results for input(s): NA, K, CL, CO2, GLUCOSE, BUN, CREATININE, CALCIUM in the last 72 hours. PT/INR No results for input(s): LABPROT, INR in the last 72 hours. ABG No results for input(s): PHART, HCO3 in the last 72 hours.  Invalid input(s): PCO2, PO2  Studies/Results: Dg Chest 1 View  01/01/2016  CLINICAL DATA:  Hypoxia EXAM: CHEST 1 VIEW COMPARISON:  12/26/2015 FINDINGS: Cardiac shadow is mildly accentuated by the portable technique. The lungs are well aerated bilaterally. Mild persistent left basilar atelectasis is noted. No focal confluent infiltrate is seen. No bony abnormality is noted. IMPRESSION: Stable left basilar atelectasis. Electronically Signed   By: Inez Catalina M.D.   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 Hospital course, located by PE status post IVC filter and gram negative rod bacteremia/sepsis. Continues to have ongoing mild hematuria but not clinically significant. Has only required 1u blood transfusion, Hbg stable.   Patient stated he is voiding clear yellow urine this morning.    Plan: -Consider replacement if patient unable to void later today -continue appropriate abx for +Ucx/BCx -Bladder scan today -Patient will likely benefit from hyperbaric oxygen as outpatient -outpt cysto      LOS: 8 days    [facility] 01/02/2016   I have labs/ imaging reviewed and discussed his management with Zara Council.  I reviewed the PA's note and agree with the documented findings and plan of care.  Voiding clear yellow urine with slightly elevated PVR in 200s.  Patient knows how to self cath if needed therefore OK for discharge with urology f/u for cysto.  Patient not seen today as he was discharged prior to my rounds.   Hollice Espy, MD

## 2016-01-02 NOTE — Clinical Social Work Note (Signed)
Pt is ready for discharge today to [facility]. Pt and wife are agreeable to discharge plan. Facility is able to accept pt as they have received discharge information. RN will call report and EMS will provide transportation. CSW is signing off as no further needs identified.   Darden Dates, MSW, LCSW  Clinical Social Worker  [phone]

## 2016-01-02 NOTE — Progress Notes (Signed)
Patient voided incontinent, no signs of bleeding or blood clots. Bladder scanned after incontinent episode and 242ml remained. Patient does not feel that he can void anymore.

## 2016-01-02 NOTE — Discharge Summary (Signed)
[facility] [place] NAME: Geoffrey Barber    MR#:  [id number]  DATE OF BIRTH:  [birth year]  DATE OF ADMISSION:  12/25/2015 ADMITTING PHYSICIAN: Hollice Espy, MD  DATE OF DISCHARGE: 01/02/16  PRIMARY CARE PHYSICIAN: BABAOFF, Caryl Bis, MD    ADMISSION DIAGNOSIS:  Hematuria [R31.9]  DISCHARGE DIAGNOSIS:  Active Problems:   Hematuria   SECONDARY DIAGNOSIS:   Past Medical History  Diagnosis Date  . Cancer ([place])   . Hypertension   . Arthritis     HOSPITAL COURSE:   82 year old male with past medical history of bladder cancer and prostate cancer status post radiation presented to the hospital with hematuria secondary to radiation cystitis.  #1 sepsis-secondary to urinary tract infection. -Blood and urine cultures growing Escherichia coli. Repeat blood cultures are negative. -Patient has been on Rocephin in the hospital. Since the Escherichia coli is pansensitive, will discharge on Keflex to finish total 2 weeks course  #2 acute hypoxic respiratory failure-secondary to pneumonia and pulmonary embolism in the right middle lobe. -Cannot be anticoagulated due to his hematuria, so status post IVC filter placement. No DVT in both lower extremities - currently needing up to 3L o2- wean as tolerated - started on advair and nebs prn - on ABX as well - repeat CXR with basilar atelectasis  #3 acute on chronic anemia-baseline hemoglobin seems to be around 10. Now at discharge he is stable around 7.9. He did receive 1 unit packed RBC. -Reason of acute anemia is acute blood loss from his hematuria. -Discharge on iron supplements  #4 hematuria-secondary to radiation/hemorrhagic cystitis -Foley catheter discontinued. Appreciate urology consult in the hospital. Patient has been incontinent but voiding clear amber colored urine. Did receive continuous bladder irrigation and the hospital. -Still retaining about 200 cc in the bladder after voiding  based on bladder scan. Continue Terazosin for now. -Outpatient urology follow-up  #5 HTN- cont home meds- norvasc, lisinopril   #6 GERD- PPI  Patient will be discharged back to [facility] at [place]:   Stable  CONSULTS OBTAINED:  Treatment Team:  [name], MD  DRUG ALLERGIES:  No Known Allergies  DISCHARGE MEDICATIONS:   Current Discharge Medication List    START taking these medications   Details  cephALEXin (KEFLEX) 500 MG capsule Take 1 capsule (500 mg total) by mouth 3 (three) times daily. X 10 days Qty: 30 capsule, Refills: 0    ferrous sulfate (FERROUSUL) 325 (65 FE) MG tablet Take 1 tablet (325 mg total) by mouth 2 (two) times daily with a meal. Qty: 60 tablet, Refills: 2    Fluticasone-Salmeterol (ADVAIR DISKUS) 250-50 MCG/DOSE AEPB Inhale 1 puff into the lungs 2 (two) times daily. Qty: 60 each, Refills: 0    HYDROcodone-acetaminophen (NORCO/VICODIN) 5-325 MG tablet Take 1 tablet by mouth every 4 (four) hours as needed for moderate pain. Qty: 30 tablet, Refills: 0    ipratropium-albuterol (DUONEB) 0.5-2.5 (3) MG/3ML SOLN Take 3 mLs by nebulization every 6 (six) hours as needed. Qty: 360 mL, Refills: 0    opium-belladonna (B&O SUPPRETTES) 16.2-60 MG suppository Place 1 suppository rectally every 8 (eight) hours as needed for bladder spasms. Qty: 20 suppository, Refills: 0      CONTINUE these medications which have NOT CHANGED   Details  acetaminophen (TYLENOL) 325 MG tablet Take 650 mg by mouth every 6 (six) hours as needed for mild pain.    amLODipine (NORVASC) 5 MG tablet Take 5  mg by mouth daily.    Cholecalciferol (VITAMIN D3) 1000 units CAPS Take 1 capsule by mouth daily.    dapsone 25 MG tablet Take 25 mg by mouth every other day.    lisinopril (PRINIVIL,ZESTRIL) 10 MG tablet Take 10 mg by mouth daily.    Multiple Vitamins-Minerals (MULTIVITAMIN WITH MINERALS) tablet Take 1 tablet by mouth daily.    Multiple  Vitamins-Minerals (PRESERVISION AREDS) CAPS Take 1 capsule by mouth 2 (two) times daily.    omeprazole (PRILOSEC) 20 MG capsule Take 20 mg by mouth daily.    psyllium (METAMUCIL) 58.6 % powder Take 1 packet by mouth daily.    terazosin (HYTRIN) 5 MG capsule Take 5 mg by mouth at bedtime.       STOP taking these medications     potassium chloride SA (K-DUR,KLOR-CON) 20 MEQ tablet      traMADol (ULTRAM) 50 MG tablet          DISCHARGE INSTRUCTIONS:   1. Watch for any more hematuria 2. PCP f/u in 1 week 3. Urology f/u in 1-2 weeks 4. Please do Incentive spirometry regularly  If you experience worsening of your admission symptoms, develop shortness of breath, life threatening emergency, suicidal or homicidal thoughts you must seek medical attention immediately by calling 911 or calling your MD immediately  if symptoms less severe.  You Must read complete instructions/literature along with all the possible adverse reactions/side effects for all the Medicines you take and that have been prescribed to you. Take any new Medicines after you have completely understood and accept all the possible adverse reactions/side effects.   Please note  You were cared for by a hospitalist during your hospital stay. If you have any questions about your discharge medications or the care you received while you were in the hospital after you are discharged, you can call the unit and asked to speak with the hospitalist on call if the hospitalist that took care of you is not available. Once you are discharged, your primary care physician will handle any further medical issues. Please note that NO REFILLS for any discharge medications will be authorized once you are discharged, as it is imperative that you return to your primary care physician (or establish a relationship with a primary care physician if you do not have one) for your aftercare needs so that they can reassess your need for medications and monitor  your lab values.    Today   CHIEF COMPLAINT:   Chief Complaint  Patient presents with  . Hematuria    VITAL SIGNS:  Blood pressure 130/69, pulse 84, temperature 97.9 F (36.6 C), temperature source Oral, resp. rate 20, height 6' (1.829 m), weight 97.523 kg (215 lb), SpO2 99 %.  I/O:   Intake/Output Summary (Last 24 hours) at 01/02/16 0909 Last data filed at 01/02/16 0414  Gross per 24 hour  Intake    720 ml  Output   1805 ml  Net  -1085 ml    PHYSICAL EXAMINATION:   Physical Exam  GENERAL:  82 y.o.-year-old patient sitting in the bed with no acute distress.  EYES: Pupils equal, round, reactive to light and accommodation. No scleral icterus. Extraocular muscles intact.  HEENT: Head atraumatic, normocephalic. Oropharynx and nasopharynx clear.  NECK:  Supple, no jugular venous distention. No thyroid enlargement, no tenderness.  LUNGS: Normal breath sounds bilaterally, no wheezing, rales or crepitation. Fine bibasilar rhonchi, No use of accessory muscles of respiration.  CARDIOVASCULAR: S1, S2 normal. No murmurs, rubs,  or gallops.  ABDOMEN: Soft, non-tender, non-distended. Bowel sounds present. No organomegaly or mass.  EXTREMITIES: No pedal edema, cyanosis, or clubbing.  NEUROLOGIC: Cranial nerves II through XII are intact. Muscle strength 5/5 in all extremities. Sensation intact. Gait not checked.  PSYCHIATRIC: The patient is alert and oriented x 3.  SKIN: No obvious rash, lesion, or ulcer.   DATA REVIEW:   CBC  Recent Labs Lab 12/31/15 0508  WBC 7.4  HGB 7.9*  HCT 23.2*  PLT 232    Chemistries   Recent Labs Lab 12/29/15 0434  NA 135  K 3.7  CL 107  CO2 21*  GLUCOSE 109*  BUN 39*  CREATININE 1.09  CALCIUM 7.8*    Cardiac Enzymes  Recent Labs Lab 12/27/15 1817  TROPONINI 0.04*    Microbiology Results  Results for orders placed or performed during the hospital encounter of 12/25/15  Urine culture     Status: Abnormal   Collection Time:  12/25/15  7:01 AM  Result Value Ref Range Status   Specimen Description URINE, CATHETERIZED  Final   Special Requests NONE  Final   Culture >=100,000 COLONIES/mL ESCHERICHIA COLI (A)  Final   Report Status 12/28/2015 FINAL  Final   Organism ID, Bacteria ESCHERICHIA COLI (A)  Final      Susceptibility   Escherichia coli - MIC*    AMPICILLIN <=2 SENSITIVE Sensitive     CEFAZOLIN <=4 SENSITIVE Sensitive     CEFTRIAXONE <=1 SENSITIVE Sensitive     CIPROFLOXACIN <=0.25 SENSITIVE Sensitive     GENTAMICIN <=1 SENSITIVE Sensitive     IMIPENEM <=0.25 SENSITIVE Sensitive     NITROFURANTOIN <=16 SENSITIVE Sensitive     TRIMETH/SULFA <=20 SENSITIVE Sensitive     AMPICILLIN/SULBACTAM <=2 SENSITIVE Sensitive     PIP/TAZO <=4 SENSITIVE Sensitive     Extended ESBL NEGATIVE Sensitive     * >=100,000 COLONIES/mL ESCHERICHIA COLI  CULTURE, BLOOD (ROUTINE X 2) w Reflex to ID Panel     Status: Abnormal   Collection Time: 12/26/15 12:44 PM  Result Value Ref Range Status   Specimen Description BLOOD RIGHT ANTECUBITAL  Final   Special Requests   Final    BOTTLES DRAWN AEROBIC AND ANAEROBIC  AERO 10CC ANA 5CC   Culture  Setup Time   Final    GRAM NEGATIVE RODS IN BOTH AEROBIC AND ANAEROBIC BOTTLES CRITICAL VALUE NOTED.  VALUE IS CONSISTENT WITH PREVIOUSLY REPORTED AND CALLED VALUE. NATE COOKSON ON 12/27/15 AT 0237 [facility]    Culture (A)  Final    ESCHERICHIA COLI SUSCEPTIBILITIES PERFORMED ON PREVIOUS CULTURE WITHIN THE LAST 5 DAYS. Performed at [facility]    Report Status 12/29/2015 FINAL  Final  CULTURE, BLOOD (ROUTINE X 2) w Reflex to ID Panel     Status: Abnormal   Collection Time: 12/26/15 12:45 PM  Result Value Ref Range Status   Specimen Description BLOOD RIGHT HAND  Final   Special Requests   Final    BOTTLES DRAWN AEROBIC AND ANAEROBIC  AERO 4CC ANA 3CC   Culture  Setup Time   Final    GRAM NEGATIVE RODS IN BOTH AEROBIC AND ANAEROBIC BOTTLES CRITICAL RESULT CALLED TO, READ BACK BY  AND VERIFIED WITH: NATE COOKSON ON 12/27/15 AT [place] [facility] [place]    Culture ESCHERICHIA COLI (A)  Final   Report Status 12/29/2015 FINAL  Final   Organism ID, Bacteria ESCHERICHIA COLI  Final      Susceptibility   Escherichia coli - MIC*  AMPICILLIN <=2 SENSITIVE Sensitive     CEFAZOLIN <=4 SENSITIVE Sensitive     CEFEPIME <=1 SENSITIVE Sensitive     CEFTAZIDIME <=1 SENSITIVE Sensitive     CEFTRIAXONE <=1 SENSITIVE Sensitive     CIPROFLOXACIN <=0.25 SENSITIVE Sensitive     GENTAMICIN <=1 SENSITIVE Sensitive     IMIPENEM <=0.25 SENSITIVE Sensitive     TRIMETH/SULFA <=20 SENSITIVE Sensitive     AMPICILLIN/SULBACTAM <=2 SENSITIVE Sensitive     PIP/TAZO <=4 SENSITIVE Sensitive     Extended ESBL NEGATIVE Sensitive     * ESCHERICHIA COLI  Blood Culture ID Panel (Reflexed)     Status: Abnormal   Collection Time: 12/26/15 12:45 PM  Result Value Ref Range Status   Enterococcus species NOT DETECTED NOT DETECTED Final   Vancomycin resistance NOT DETECTED NOT DETECTED Final   Listeria monocytogenes NOT DETECTED NOT DETECTED Final   Staphylococcus species NOT DETECTED NOT DETECTED Final   Staphylococcus aureus NOT DETECTED NOT DETECTED Final   Methicillin resistance NOT DETECTED NOT DETECTED Final   Streptococcus species NOT DETECTED NOT DETECTED Final   Streptococcus agalactiae NOT DETECTED NOT DETECTED Final   Streptococcus pneumoniae NOT DETECTED NOT DETECTED Final   Streptococcus pyogenes NOT DETECTED NOT DETECTED Final   Acinetobacter baumannii NOT DETECTED NOT DETECTED Final   Enterobacteriaceae species DETECTED (A) NOT DETECTED Final    Comment: CRITICAL RESULT CALLED TO, READ BACK BY AND VERIFIED WITH: NATE COOKSON ON 12/27/15 AT 0237 [facility]    Enterobacter cloacae complex NOT DETECTED NOT DETECTED Final   Escherichia coli DETECTED (A) NOT DETECTED Final    Comment: CRITICAL RESULT CALLED TO, READ BACK BY AND VERIFIED WITH: NATE COOKSON ON 12/27/15 AT 0237 [facility]    Klebsiella oxytoca NOT DETECTED  NOT DETECTED Final   Klebsiella pneumoniae NOT DETECTED NOT DETECTED Final   Proteus species NOT DETECTED NOT DETECTED Final   Serratia marcescens NOT DETECTED NOT DETECTED Final   Carbapenem resistance NOT DETECTED NOT DETECTED Final   Haemophilus influenzae NOT DETECTED NOT DETECTED Final   Neisseria meningitidis NOT DETECTED NOT DETECTED Final   Pseudomonas aeruginosa NOT DETECTED NOT DETECTED Final   Candida albicans NOT DETECTED NOT DETECTED Final   Candida glabrata NOT DETECTED NOT DETECTED Final   Candida krusei NOT DETECTED NOT DETECTED Final   Candida parapsilosis NOT DETECTED NOT DETECTED Final   Candida tropicalis NOT DETECTED NOT DETECTED Final  MRSA PCR Screening     Status: None   Collection Time: 12/27/15  2:00 AM  Result Value Ref Range Status   MRSA by PCR NEGATIVE NEGATIVE Final    Comment:        The GeneXpert MRSA Assay (FDA approved for NASAL specimens only), is one component of a comprehensive MRSA colonization surveillance program. It is not intended to diagnose MRSA infection nor to guide or monitor treatment for MRSA infections.   CULTURE, BLOOD (ROUTINE X 2) w Reflex to ID Panel     Status: None (Preliminary result)   Collection Time: 12/30/15  8:02 AM  Result Value Ref Range Status   Specimen Description BLOOD RIGHT AC  Final   Special Requests   Final    BOTTLES DRAWN AEROBIC AND ANAEROBIC AER 8ML ANA 7ML   Culture NO GROWTH 3 DAYS  Final   Report Status PENDING  Incomplete  CULTURE, BLOOD (ROUTINE X 2) w Reflex to ID Panel     Status: None (Preliminary result)   Collection Time: 12/30/15  8:02 AM  Result  Value Ref Range Status   Specimen Description BLOOD LEFT AC  Final   Special Requests   Final    BOTTLES DRAWN AEROBIC AND ANAEROBIC AER 10ML ANA 7ML   Culture NO GROWTH 3 DAYS  Final   Report Status PENDING  Incomplete    RADIOLOGY:  Dg Chest 1 View  01/01/2016  CLINICAL DATA:  Hypoxia EXAM: CHEST 1 VIEW COMPARISON:  12/26/2015 FINDINGS:  Cardiac shadow is mildly accentuated by the portable technique. The lungs are well aerated bilaterally. Mild persistent left basilar atelectasis is noted. No focal confluent infiltrate is seen. No bony abnormality is noted. IMPRESSION: Stable left basilar atelectasis. Electronically Signed   By: Inez Catalina M.D.   On: 01/01/2016 12:40    EKG:   Orders placed or performed in visit on 02/24/12  . EKG 12-Lead      Management plans discussed with the patient, family and they are in agreement.  CODE STATUS:     Code Status Orders        Start     Ordered   12/25/15 1624  Full code   Continuous     12/25/15 1623    Code Status History    Date Active Date Inactive Code Status Order ID Comments User Context   12/12/2015 12:41 AM 12/14/2015  7:05 PM Full Code [id number]  Fritzi Mandes, MD Inpatient    Advance Directive Documentation        Most Recent Value   Type of Advance Directive  Healthcare Power of Attorney, Living will   Pre-existing out of facility DNR order (yellow form or pink MOST form)     "MOST" Form in Place?        TOTAL TIME TAKING CARE OF THIS PATIENT: 40 minutes.    Gladstone Lighter M.D on 01/02/2016 at 9:09 AM  Between 7am to 6pm - Pager - [phone]  After 6pm go to [email or web address] - password [email or web address] [facility]  Office  [phone]  CC: Primary care physician; BABAOFF, Caryl Bis, MD

## 2016-01-07 LAB — CULTURE, BLOOD (ROUTINE X 2)
CULTURE: NO GROWTH
Culture: NO GROWTH

## 2016-01-07 LAB — COMPREHENSIVE METABOLIC PANEL
ALBUMIN: 3.1 g/dL — AB (ref 3.5–5.0)
ALK PHOS: 90 U/L (ref 38–126)
ALT: 28 U/L (ref 17–63)
ANION GAP: 4 — AB (ref 5–15)
AST: 22 U/L (ref 15–41)
BUN: 26 mg/dL — ABNORMAL HIGH (ref 6–20)
CALCIUM: 7.8 mg/dL — AB (ref 8.9–10.3)
CO2: 24 mmol/L (ref 22–32)
CREATININE: 0.99 mg/dL (ref 0.61–1.24)
Chloride: 106 mmol/L (ref 101–111)
GFR calc Af Amer: >60 mL/min (ref >60)
GFR calc non Af Amer: >60 mL/min (ref >60)
GLUCOSE: 87 mg/dL (ref 65–99)
Potassium: 3.8 mmol/L (ref 3.5–5.1)
SODIUM: 134 mmol/L — AB (ref 135–145)
Total Bilirubin: 0.3 mg/dL (ref 0.3–1.2)
Total Protein: 5.9 g/dL — ABNORMAL LOW (ref 6.5–8.1)

## 2016-01-07 LAB — CBC WITH DIFFERENTIAL/PLATELET
Basophils Absolute: 0 10*3/uL (ref 0–0.1)
Basophils Relative: 1 %
EOS ABS: 0 10*3/uL (ref 0–0.7)
EOS PCT: 1 %
HCT: 23 % — ABNORMAL LOW (ref 40.0–52.0)
Hemoglobin: 7.7 g/dL — ABNORMAL LOW (ref 13.0–18.0)
LYMPHS ABS: 0.9 10*3/uL — AB (ref 1.0–3.6)
LYMPHS PCT: 20 %
MCH: 28.4 pg (ref 26.0–34.0)
MCHC: 33.3 g/dL (ref 32.0–36.0)
MCV: 85.1 fL (ref 80.0–100.0)
MONOS PCT: 12 %
Monocytes Absolute: 0.6 10*3/uL (ref 0.2–1.0)
Neutro Abs: 2.9 10*3/uL (ref 1.4–6.5)
Neutrophils Relative %: 66 %
Platelets: 418 10*3/uL (ref 150–440)
RBC: 2.71 MIL/uL — ABNORMAL LOW (ref 4.40–5.90)
RDW: 18.2 % — ABNORMAL HIGH (ref 11.5–14.5)
WBC: 4.5 10*3/uL (ref 3.8–10.6)

## 2016-01-09 LAB — COMPREHENSIVE METABOLIC PANEL
ALBUMIN: 3 g/dL — AB (ref 3.5–5.0)
ALT: 23 U/L (ref 17–63)
ANION GAP: 7 (ref 5–15)
AST: 22 U/L (ref 15–41)
Alkaline Phosphatase: 83 U/L (ref 38–126)
BILIRUBIN TOTAL: 0.5 mg/dL (ref 0.3–1.2)
BUN: 22 mg/dL — ABNORMAL HIGH (ref 6–20)
CO2: 23 mmol/L (ref 22–32)
Calcium: 8.2 mg/dL — ABNORMAL LOW (ref 8.9–10.3)
Chloride: 107 mmol/L (ref 101–111)
Creatinine, Ser: 1.09 mg/dL (ref 0.61–1.24)
GFR calc non Af Amer: 58 mL/min — ABNORMAL LOW (ref >60)
GLUCOSE: 132 mg/dL — AB (ref 65–99)
POTASSIUM: 3.9 mmol/L (ref 3.5–5.1)
SODIUM: 137 mmol/L (ref 135–145)
TOTAL PROTEIN: 5.8 g/dL — AB (ref 6.5–8.1)

## 2016-01-09 LAB — CBC WITH DIFFERENTIAL/PLATELET
BASOS PCT: 1 %
Basophils Absolute: 0 10*3/uL (ref 0–0.1)
EOS ABS: 0 10*3/uL (ref 0–0.7)
Eosinophils Relative: 1 %
HCT: 23.3 % — ABNORMAL LOW (ref 40.0–52.0)
Hemoglobin: 8.1 g/dL — ABNORMAL LOW (ref 13.0–18.0)
Lymphocytes Relative: 21 %
Lymphs Abs: 0.7 10*3/uL — ABNORMAL LOW (ref 1.0–3.6)
MCH: 29.4 pg (ref 26.0–34.0)
MCHC: 34.6 g/dL (ref 32.0–36.0)
MCV: 84.9 fL (ref 80.0–100.0)
MONO ABS: 0.4 10*3/uL (ref 0.2–1.0)
MONOS PCT: 12 %
Neutro Abs: 2.3 10*3/uL (ref 1.4–6.5)
Neutrophils Relative %: 65 %
Platelets: 407 10*3/uL (ref 150–440)
RBC: 2.74 MIL/uL — ABNORMAL LOW (ref 4.40–5.90)
RDW: 17.4 % — AB (ref 11.5–14.5)
WBC: 3.5 10*3/uL — ABNORMAL LOW (ref 3.8–10.6)

## 2016-01-14 LAB — URINE CULTURE: CULTURE: NO GROWTH

## 2016-01-17 LAB — URINALYSIS COMPLETE WITH MICROSCOPIC (ARMC ONLY)
BACTERIA UA: NONE SEEN
BILIRUBIN URINE: NEGATIVE
Glucose, UA: NEGATIVE mg/dL
Ketones, ur: NEGATIVE mg/dL
LEUKOCYTES UA: NEGATIVE
NITRITE: NEGATIVE
PH: 6 (ref 5.0–8.0)
PROTEIN: 30 mg/dL — AB
Specific Gravity, Urine: 1.016 (ref 1.005–1.030)

## 2016-01-18 ENCOUNTER — Encounter
Admission: RE | Admit: 2016-01-18 | Discharge: 2016-01-18 | Disposition: A | Discharge status: Home / Self Care | Payer: Medicare Other | Source: Ambulatory Visit | Attending: Internal Medicine | Admitting: Internal Medicine

## 2016-01-23 ENCOUNTER — Ambulatory Visit (INDEPENDENT_AMBULATORY_CARE_PROVIDER_SITE_OTHER): Payer: Medicare Other | Admitting: Urology

## 2016-01-23 ENCOUNTER — Other Ambulatory Visit: Payer: Medicare Other | Admitting: Urology

## 2016-01-23 ENCOUNTER — Encounter: Payer: Self-pay | Admitting: Urology

## 2016-01-23 VITALS — BP 153/83 | HR 84 | Ht 72.0 in | Wt 215.0 lb

## 2016-01-23 DIAGNOSIS — R319 Hematuria, unspecified: Secondary | ICD-10-CM

## 2016-01-23 LAB — URINALYSIS, COMPLETE
BILIRUBIN UA: NEGATIVE
Glucose, UA: NEGATIVE
Ketones, UA: NEGATIVE
NITRITE UA: NEGATIVE
PROTEIN UA: NEGATIVE
SPEC GRAV UA: 1.015 (ref 1.005–1.030)
UUROB: 0.2 mg/dL (ref 0.2–1.0)
pH, UA: 6 (ref 5.0–7.5)

## 2016-01-23 LAB — MICROSCOPIC EXAMINATION: BACTERIA UA: NONE SEEN

## 2016-01-23 MED ORDER — CIPROFLOXACIN HCL 500 MG PO TABS
500.0000 mg | ORAL_TABLET | Freq: Once | ORAL | Status: AC
  Administered 2016-01-23: 500 mg via ORAL

## 2016-01-23 MED ORDER — LIDOCAINE HCL 2 % EX GEL
1.0000 "application " | Freq: Once | CUTANEOUS | Status: AC
  Administered 2016-01-23: 1 via URETHRAL

## 2016-01-23 NOTE — Progress Notes (Signed)
01/23/2016 10:28 AM   Geoffrey Barber [birth year] [id number]  Referring provider: Derinda Late, MD [id number] [address] [facility] [place], [place] [address]  Chief Complaint  Patient presents with  . Cysto    New patient hospital follow up    HPI: Geoffrey Barber is a 82 y.o. year old with a history of radiation therapy for prostate cancer, urethral stricture, and gross hematuria who was admitted just last week with gross hematuria/clot retention. At that time, he was having trouble emptying the bladder and having abdominal discomfort. He tried to self catheterize himself but he does irregularly for the purpose of keeping his urethral stricture patent but was unsuccessful. Noncontrast CT scan showed a 27 x 13 mm depended mass in the bladder, most likely clot. He ultimately had aggressive hand irrigation of his bladder. An 14 [place] coud was in place during that admission. His plavix was stopped. He ultimately passed a voiding trial and was discharged home.   He returned to the emergency room today with further episodes of gross hematuria and lower abdominal pain. In the emergency room, an 22 [place] 3-way was again placed but continued to clot off. He was hand irrigated by the nurse for some fairly significant clot burden. His hemoglobin has remained stable around 10.  In terms of prostate cancer, he did have radiation for prostate. He thinks his PSA was check recently at the [facility] [place] and was noted to be normal. He saw Dr. Jacqlyn Barber in 2015. A PSA was less than 0.1 at that time. Also he reports he's had urethral dilations in the past for urethral stricture. He reports gross hematuria about 3 years ago and cystoscopy at the [place] which he was told was radiation cystitis but does not recall urethral dilation at that time.  Interval history: The patient eventually passed a stroke void and discharged from the hospital with light pink urine voids. He presents today for  cystoscopy for evaluation of his bladder.   PMH: Past Medical History  Diagnosis Date  . Prostate cancer ([place])   . Hypertension   . Arthritis   . Pneumonia   . PE (pulmonary embolism)   . UTI (lower urinary tract infection)   . Irradiation cystitis with hematuria   . Anemia   . Difficulty walking   . Muscle weakness   . GERD (gastroesophageal reflux disease)   . BPH without obstruction/lower urinary tract symptoms     Surgical History: Past Surgical History  Procedure Laterality Date  . Peripheral vascular catheterization N/A 12/27/2015    Procedure: IVC Filter Insertion;  Surgeon: [name], MD;  Location: [facility];  Service: Cardiovascular;  Laterality: N/A;  . Total knee arthroplasty  2006, 2009  . Rectal cyst removal  1954    Home Medications:    Medication List       This list is accurate as of: 01/23/16 10:28 AM.  Always use your most recent med list.               acetaminophen 325 MG tablet  Commonly known as:  TYLENOL  Take 650 mg by mouth every 6 (six) hours as needed for mild pain.     amLODipine 5 MG tablet  Commonly known as:  NORVASC  Take 5 mg by mouth daily.     dapsone 25 MG tablet  Take 25 mg by mouth every other day.     ferrous sulfate 325 (65 FE)  MG tablet  Commonly known as:  FERROUSUL  Take 1 tablet (325 mg total) by mouth 2 (two) times daily with a meal.     Fluticasone-Salmeterol 250-50 MCG/DOSE Aepb  Commonly known as:  ADVAIR DISKUS  Inhale 1 puff into the lungs 2 (two) times daily.     HYDROcodone-acetaminophen 5-325 MG tablet  Commonly known as:  NORCO/VICODIN  Take 1 tablet by mouth every 4 (four) hours as needed for moderate pain.     ipratropium-albuterol 0.5-2.5 (3) MG/3ML Soln  Commonly known as:  DUONEB  Take 3 mLs by nebulization every 6 (six) hours as needed.     lisinopril 10 MG tablet  Commonly known as:  PRINIVIL,ZESTRIL  Take 10 mg by mouth daily.     multivitamin with minerals tablet    Take 1 tablet by mouth daily.     PRESERVISION AREDS Caps  Take 1 capsule by mouth 2 (two) times daily.     omeprazole 20 MG capsule  Commonly known as:  PRILOSEC  Take 20 mg by mouth daily.     opium-belladonna 16.2-60 MG suppository  Commonly known as:  B&O SUPPRETTES  Place 1 suppository rectally every 8 (eight) hours as needed for bladder spasms.     psyllium 58.6 % powder  Commonly known as:  METAMUCIL  Take 1 packet by mouth daily.     terazosin 5 MG capsule  Commonly known as:  HYTRIN  Take 5 mg by mouth at bedtime.     Vitamin D3 1000 units Caps  Take 1 capsule by mouth daily.        Allergies: No Known Allergies  Family History: Family History  Problem Relation Age of Onset  . Prostate cancer Neg Hx   . Bladder Cancer Neg Hx   . Kidney cancer Neg Hx     Social History:  reports that he has quit smoking. He does not have any smokeless tobacco history on file. He reports that he does not drink alcohol. His drug history is not on file.  ROS: UROLOGY Frequent Urination?: Yes Hard to postpone urination?: Yes Burning/pain with urination?: No Get up at night to urinate?: Yes Leakage of urine?: Yes Urine stream starts and stops?: Yes Trouble starting stream?: Yes Do you have to strain to urinate?: No Blood in urine?: No Urinary tract infection?: No Sexually transmitted disease?: No Injury to kidneys or bladder?: No Painful intercourse?: No Weak stream?: Yes Erection problems?: Yes Penile pain?: No  Gastrointestinal Nausea?: No Vomiting?: No Indigestion/heartburn?: No Diarrhea?: No Constipation?: No  Constitutional Fever: No Night sweats?: No Weight loss?: No Fatigue?: No  Skin Skin rash/lesions?: No Itching?: No  Eyes Blurred vision?: No Double vision?: No  Ears/Nose/Throat Sore throat?: No Sinus problems?: No  Hematologic/Lymphatic Swollen glands?: No Easy bruising?: Yes  Cardiovascular Leg swelling?: Yes Chest pain?:  No  Respiratory Cough?: No Shortness of breath?: No  Endocrine Excessive thirst?: No  Musculoskeletal Back pain?: Yes Joint pain?: Yes  Neurological Headaches?: No Dizziness?: No  Psychologic Depression?: No Anxiety?: No  Physical Exam: BP 153/83 mmHg  Pulse 84  Ht 6' (1.829 m)  Wt 215 lb (97.523 kg)  BMI 29.15 kg/m2  Constitutional:  Alert and oriented, No acute distress. HEENT: [place] AT, moist mucus membranes.  Trachea midline, no masses. Cardiovascular: No clubbing, cyanosis, or edema. Respiratory: Normal respiratory effort, no increased work of breathing. GI: Abdomen is soft, nontender, nondistended, no abdominal masses GU: No CVA tenderness.  Skin: No rashes, bruises or suspicious  lesions. Lymph: No cervical or inguinal adenopathy. Neurologic: Grossly intact, no focal deficits, moving all 4 extremities. Psychiatric: Normal mood and affect.  Laboratory Data: Lab Results  Component Value Date   WBC 3.5* 01/09/2016   HGB 8.1* 01/09/2016   HCT 23.3* 01/09/2016   MCV 84.9 01/09/2016   PLT 407 01/09/2016    Lab Results  Component Value Date   CREATININE 1.09 01/09/2016    No results found for: PSA  No results found for: TESTOSTERONE  No results found for: HGBA1C  Urinalysis    Component Value Date/Time   COLORURINE AMBER* 01/13/2016 1305   COLORURINE Yellow 02/24/2012 0716   APPEARANCEUR CLOUDY* 01/13/2016 1305   APPEARANCEUR Clear 02/24/2012 0716   LABSPEC 1.016 01/13/2016 1305   LABSPEC 1.009 02/24/2012 0716   PHURINE 6.0 01/13/2016 1305   PHURINE 7.0 02/24/2012 0716   GLUCOSEU NEGATIVE 01/13/2016 1305   GLUCOSEU Negative 02/24/2012 0716   HGBUR 1+* 01/13/2016 1305   HGBUR Negative 02/24/2012 0716   BILIRUBINUR NEGATIVE 01/13/2016 1305   BILIRUBINUR Negative 02/24/2012 0716   KETONESUR NEGATIVE 01/13/2016 1305   KETONESUR Negative 02/24/2012 0716   PROTEINUR 30* 01/13/2016 1305   PROTEINUR Negative 02/24/2012 0716   NITRITE NEGATIVE  01/13/2016 1305   NITRITE Negative 02/24/2012 0716   LEUKOCYTESUR NEGATIVE 01/13/2016 1305   LEUKOCYTESUR Negative 02/24/2012 0716     Cystoscopy Procedure Note  Patient identification was confirmed, informed consent was obtained, and patient was prepped using Betadine solution.  Lidocaine jelly was administered per urethral meatus.    Preoperative abx where received prior to procedure.     Pre-Procedure: - Inspection reveals a normal caliber ureteral meatus.  Procedure: The flexible cystoscope was introduced without difficulty - No urethral strictures/lesions are present. - Enlarged prostate  - Normal bladder neck - Bilateral ureteral orifices identified - Bladder mucosa  reveals no ulcers, tumors, or lesions. Hypervascular consistent with radiation cystitis - No bladder stones - No trabeculation  Retroflexion shows no intravesical lobe   Post-Procedure: - Patient tolerated the procedure well    Assessment & Plan:    1. Gross hematuria - negative cystoscopy except for hypervascular bladder consistent with radiation cystitis.  -follow up prn return of hematuria  2. Radiation cystitis -asymptomatic currently.   3. History of prostate cancer Followed at the [place], presumed NED Most recent documented PSA on our system undetectable 2015   Nickie Retort, MD  [facility] [address], [place], [place] [address] [phone]

## 2016-01-29 ENCOUNTER — Non-Acute Institutional Stay (SKILLED_NURSING_FACILITY): Payer: Medicare Other | Admitting: Gerontology

## 2016-01-29 DIAGNOSIS — R319 Hematuria, unspecified: Secondary | ICD-10-CM | POA: Diagnosis not present

## 2016-01-29 NOTE — Progress Notes (Signed)
Location:  [facility] Service:  [id number])  Provider: Toni Arthurs, NP-C  PCP: Marcello Fennel, MD Patient Care Team: Derinda Late, MD as PCP - General (Family Medicine)  Extended Emergency Contact Information Primary Emergency Contact: Woolbright,Elton Address: [address]          [place]          [place], [place] [address] [place] Phone: [phone] Relation: Spouse Secondary Emergency Contact: [place] of [place] Phone: [phone] Relation: Son  Code Status: Full Goals of care:  Advanced Directive information Advanced Directives 12/25/2015  Does patient have an advance directive? Yes  Type of Paramedic of [place];Living will  Does patient want to make changes to advanced directive? No - Patient declined  Copy of advanced directive(s) in chart? No - copy requested  Would patient like information on creating an advanced directive? No - patient declined information     No Known Allergies  Chief Complaint  Patient presents with  . Discharge Note    HPI:  82 y.o. male admitted to the facility following hospitalization for hematuria and sepsis r/t UTI. Staff and family feel that he is no longer able to safely live independently. He is being moved today to Assisted Living. Pt denies pain and dyspnea. Reports his only pain is "from a callous on the bottom of his foot."      Past Medical History  Diagnosis Date  . Prostate cancer ([place])   . Hypertension   . Arthritis   . Pneumonia   . PE (pulmonary embolism)   . UTI (lower urinary tract infection)   . Irradiation cystitis with hematuria   . Anemia   . Difficulty walking   . Muscle weakness   . GERD (gastroesophageal reflux disease)   . BPH without obstruction/lower urinary tract symptoms     Past Surgical History  Procedure Laterality Date  . Peripheral vascular catheterization N/A 12/27/2015    Procedure: IVC  Filter Insertion;  Surgeon: [name], MD;  Location: [facility];  Service: Cardiovascular;  Laterality: N/A;  . Total knee arthroplasty  2006, 2009  . Rectal cyst removal  1954      reports that he has quit smoking. He does not have any smokeless tobacco history on file. He reports that he does not drink alcohol. His drug history is not on file. Social History   Social History  . Marital Status: Married    Spouse Name: N/A  . Number of Children: N/A  . Years of Education: N/A   Occupational History  . Not on file.   Social History Main Topics  . Smoking status: Former Research scientist (life sciences)  . Smokeless tobacco: Not on file  . Alcohol Use: No  . Drug Use: Not on file  . Sexual Activity: Not on file   Other Topics Concern  . Not on file   Social History Narrative   Functional Status Survey:    No Known Allergies  Pertinent  Health Maintenance Due  Topic Date Due  . PNA vac Low Risk Adult (1 of 2 - PCV13) 09/29/1991  . INFLUENZA VACCINE  02/18/2016    Medications:   Medication List       This list is accurate as of: 01/29/16 10:20 PM.  Always use your most recent med list.               acetaminophen  325 MG tablet  Commonly known as:  TYLENOL  Take 650 mg by mouth every 6 (six) hours as needed for mild pain.     amLODipine 5 MG tablet  Commonly known as:  NORVASC  Take 5 mg by mouth daily.     dapsone 25 MG tablet  Take 25 mg by mouth every other day.     ferrous sulfate 325 (65 FE) MG tablet  Commonly known as:  FERROUSUL  Take 1 tablet (325 mg total) by mouth 2 (two) times daily with a meal.     Fluticasone-Salmeterol 250-50 MCG/DOSE Aepb  Commonly known as:  ADVAIR DISKUS  Inhale 1 puff into the lungs 2 (two) times daily.     HYDROcodone-acetaminophen 5-325 MG tablet  Commonly known as:  NORCO/VICODIN  Take 1 tablet by mouth every 4 (four) hours as needed for moderate pain.     ipratropium-albuterol 0.5-2.5 (3) MG/3ML Soln  Commonly known  as:  DUONEB  Take 3 mLs by nebulization every 6 (six) hours as needed.     lisinopril 10 MG tablet  Commonly known as:  PRINIVIL,ZESTRIL  Take 10 mg by mouth daily.     multivitamin with minerals tablet  Take 1 tablet by mouth daily.     PRESERVISION AREDS Caps  Take 1 capsule by mouth 2 (two) times daily.     omeprazole 20 MG capsule  Commonly known as:  PRILOSEC  Take 20 mg by mouth daily.     opium-belladonna 16.2-60 MG suppository  Commonly known as:  B&O SUPPRETTES  Place 1 suppository rectally every 8 (eight) hours as needed for bladder spasms.     psyllium 58.6 % powder  Commonly known as:  METAMUCIL  Take 1 packet by mouth daily.     terazosin 5 MG capsule  Commonly known as:  HYTRIN  Take 5 mg by mouth at bedtime.     Vitamin D3 1000 units Caps  Take 1 capsule by mouth daily.        Review of Systems  Constitutional: Negative.   HENT: Negative.   Respiratory: Positive for choking. Negative for cough, chest tightness and shortness of breath.   Cardiovascular: Negative for chest pain and leg swelling.  Gastrointestinal: Negative.   Musculoskeletal: Negative.   Skin: Positive for wound (callous on foot).  Neurological: Negative.   Psychiatric/Behavioral: Negative.   All other systems reviewed and are negative.   Filed Vitals:   01/29/16 2216  BP: 100/59  Pulse: 84  Temp: 99.4 F (37.4 C)  Resp: 18  SpO2: 93%   There is no weight on file to calculate BMI. Physical Exam  Constitutional: He is oriented to person, place, and time. He appears well-developed and well-nourished. No distress.  HENT:  Head: Normocephalic and atraumatic.  Eyes: Conjunctivae and EOM are normal. Pupils are equal, round, and reactive to light.  Neck: Normal range of motion. Neck supple. No JVD present.  Cardiovascular: Normal rate, regular rhythm and intact distal pulses.  Exam reveals friction rub. Exam reveals no gallop.   No murmur heard. Pulmonary/Chest: Breath sounds  normal. No stridor. He is in respiratory distress. He has no wheezes. He has no rales. He exhibits no tenderness.  Abdominal: Soft. Bowel sounds are normal. He exhibits no distension. There is no tenderness. There is no guarding.  Musculoskeletal: Normal range of motion. He exhibits no edema or tenderness.  Lymphadenopathy:    He has no cervical adenopathy.  Neurological: He is alert and oriented to  person, place, and time. He has normal reflexes. Coordination (weakness) abnormal.  Skin: Skin is warm and dry. No rash noted. He is not diaphoretic. No cyanosis or erythema. No pallor. Nails show no clubbing.  1 cm diameter callous on plantar surface of Right foot at 5th MTP.  Psychiatric: He has a normal mood and affect. His behavior is normal. Judgment and thought content normal.  Nursing note and vitals reviewed.   Labs reviewed: Basic Metabolic Panel:  Recent Labs  12/29/15 0434 01/07/16 1131 01/09/16 0853  NA 135 134* 137  K 3.7 3.8 3.9  CL 107 106 107  CO2 21* 24 23  GLUCOSE 109* 87 132*  BUN 39* 26* 22*  CREATININE 1.09 0.99 1.09  CALCIUM 7.8* 7.8* 8.2*   Liver Function Tests:  Recent Labs  12/24/15 0837 01/07/16 1131 01/09/16 0853  AST 18 22 22   ALT 15* 28 23  ALKPHOS 131* 90 83  BILITOT 0.3 0.3 0.5  PROT 6.4* 5.9* 5.8*  ALBUMIN 3.4* 3.1* 3.0*   No results for input(s): LIPASE, AMYLASE in the last 8760 hours. No results for input(s): AMMONIA in the last 8760 hours. CBC:  Recent Labs  12/29/15 0434  12/31/15 0508 01/07/16 1131 01/09/16 0853  WBC 6.8  --  7.4 4.5 3.5*  NEUTROABS 5.3  --   --  2.9 2.3  HGB 7.2*  < > 7.9* 7.7* 8.1*  HCT 21.4*  < > 23.2* 23.0* 23.3*  MCV 83.2  --  82.7 85.1 84.9  PLT 195  --  232 418 407  < > = values in this interval not displayed. Cardiac Enzymes:  Recent Labs  12/27/15 0659 12/27/15 1345 12/27/15 1817  TROPONINI 0.04* 0.03 0.04*   BNP: Invalid input(s): POCBNP CBG:  Recent Labs  12/27/15 0156  GLUCAP 180*      Procedures and Imaging Studies During Stay: Dg Chest 1 View  01/01/2016  CLINICAL DATA:  Hypoxia EXAM: CHEST 1 VIEW COMPARISON:  12/26/2015 FINDINGS: Cardiac shadow is mildly accentuated by the portable technique. The lungs are well aerated bilaterally. Mild persistent left basilar atelectasis is noted. No focal confluent infiltrate is seen. No bony abnormality is noted. IMPRESSION: Stable left basilar atelectasis. Electronically Signed   By: Inez Catalina M.D.   On: 01/01/2016 12:40    Assessment/Plan:   1. Hematuria  Resolved  Monitor for recurrence  Transfer to ALF   Patient is being discharged with the following home health services:  none  Patient is being discharged with the following durable medical equipment:  none  Patient has been advised to f/u with their PCP in 1-2 weeks to bring them up to date on their rehab stay.  Social services at facility was responsible for arranging this appointment.  Pt was provided with a 30 day supply of prescriptions for medications and refills must be obtained from their PCP.  For controlled substances, a more limited supply may be provided adequate until PCP appointment only.  Future labs/tests needed:  none   Vikki Ports, NP-C [facility] [phone], [place] [address] Cell Phone (Mon-Fri 8am-5pm):  [phone] On Call:  [phone] & follow prompts after 5pm & weekends Office Phone:  [phone] Office Fax:  [phone]

## 2016-01-30 ENCOUNTER — Other Ambulatory Visit: Payer: Medicare Other

## 2016-01-30 ENCOUNTER — Encounter: Payer: Self-pay | Admitting: *Deleted

## 2016-01-30 ENCOUNTER — Telehealth: Payer: Self-pay | Admitting: Urology

## 2016-01-30 NOTE — Telephone Encounter (Signed)
Patient's wife is Aladino Coury and she may be reached at [phone].  They use the [facility] on [address] in [place].

## 2016-01-30 NOTE — Telephone Encounter (Signed)
Patient's wife came into the office today to bring an updated list of medication for the patient.  I gave to the nursing staff to update.  Patient's wife was tearful and very upset.  She stated that they are not able to get any rest or sleep during the night.  The patient is getting up and wetting the bed every night 7-8 times.  She has a diary of the number of times each night that she has to get up with him and all times during the night.  She is asking for some type of medication that might help with his urinary frequency and incontinence.  Please advise.

## 2016-01-31 NOTE — Telephone Encounter (Signed)
He did not mention any of these complaints at his appointment a few days ago, so as far as I know this is a new problem which needs an appt with a provider. This could be anything from overflow incontinence to BPH to overactive bladder. He needs an appointment because I cannot figure out the underlying issue over the phone.

## 2016-02-18 ENCOUNTER — Encounter
Admission: RE | Admit: 2016-02-18 | Discharge: 2016-02-18 | Disposition: A | Discharge status: Home / Self Care | Payer: Medicare Other | Source: Ambulatory Visit | Attending: Internal Medicine | Admitting: Internal Medicine

## 2016-03-20 ENCOUNTER — Encounter: Admission: RE | Admit: 2016-03-20 | Payer: Medicare Other | Source: Ambulatory Visit | Admitting: Internal Medicine

## 2016-05-11 ENCOUNTER — Ambulatory Visit (INDEPENDENT_AMBULATORY_CARE_PROVIDER_SITE_OTHER): Payer: Medicare Other | Admitting: Vascular Surgery

## 2016-05-11 ENCOUNTER — Encounter (INDEPENDENT_AMBULATORY_CARE_PROVIDER_SITE_OTHER): Payer: Self-pay | Admitting: Vascular Surgery

## 2016-05-11 DIAGNOSIS — I10 Essential (primary) hypertension: Secondary | ICD-10-CM

## 2016-05-11 DIAGNOSIS — I2602 Saddle embolus of pulmonary artery with acute cor pulmonale: Secondary | ICD-10-CM | POA: Diagnosis not present

## 2016-05-11 DIAGNOSIS — I2699 Other pulmonary embolism without acute cor pulmonale: Secondary | ICD-10-CM | POA: Insufficient documentation

## 2016-05-11 DIAGNOSIS — C679 Malignant neoplasm of bladder, unspecified: Secondary | ICD-10-CM | POA: Diagnosis not present

## 2016-05-11 NOTE — Progress Notes (Signed)
[facility] Admission History & Physical  MRN : [id number]  Geoffrey Barber is a 82 y.o. (1924-11-25) male who presents with chief complaint of  Chief Complaint  Patient presents with  . Follow-up  .  History of Present Illness:  The patient presents to the office for evaluation of DVT.  DVT and PE was identified at [facility] by Duplex ultrasound.  The initial symptoms were pain and swelling in the lower extremity associated with SOB.  IVC filter was placed at that time, June 2017.  He has been on Elquis since June 2017.  The patient notes his leg continues to be somewhat painful with dependency and swells a bite.  Symptoms are much better with elevation.  The patient notes minimal edema in the morning which steadily worsens throughout the day.    The patient has not been using compression therapy at this point.  No further SOB or pleuritic chest pains.  No cough or hemoptysis.  No blood per rectum or blood in any sputum.  No excessive bruising per the patient.       Current Meds  Medication Sig  . acetaminophen (TYLENOL) 325 MG tablet Take 650 mg by mouth every 6 (six) hours as needed for mild pain.  Marland Kitchen amLODipine (NORVASC) 5 MG tablet Take 5 mg by mouth daily.  Marland Kitchen apixaban (ELIQUIS) 5 MG TABS tablet Take 5 mg by mouth 2 (two) times daily.  . Cholecalciferol (VITAMIN D3) 1000 units CAPS Take 1 capsule by mouth daily.  . dapsone 25 MG tablet Take 25 mg by mouth every other day.  . ferrous sulfate (FERROUSUL) 325 (65 FE) MG tablet Take 1 tablet (325 mg total) by mouth 2 (two) times daily with a meal.  . lisinopril (PRINIVIL,ZESTRIL) 10 MG tablet Take 10 mg by mouth daily.  . Multiple Vitamins-Minerals (MULTIVITAMIN WITH MINERALS) tablet Take 1 tablet by mouth daily.  . Multiple Vitamins-Minerals (PRESERVISION AREDS) CAPS Take 1 capsule by mouth 2 (two) times daily.    Past Medical History:  Diagnosis Date  . Anemia   . Arthritis   . BPH without  obstruction/lower urinary tract symptoms   . Difficulty walking   . GERD (gastroesophageal reflux disease)   . Hypertension   . Irradiation cystitis with hematuria   . Muscle weakness   . PE (pulmonary embolism)   . Pneumonia   . Prostate cancer ([place])   . UTI (lower urinary tract infection)     Past Surgical History:  Procedure Laterality Date  . PERIPHERAL VASCULAR CATHETERIZATION N/A 12/27/2015   Procedure: IVC Filter Insertion;  Surgeon: [name], MD;  Location: [facility];  Service: Cardiovascular;  Laterality: N/A;  . Rectal Cyst Removal  1954  . TOTAL KNEE ARTHROPLASTY  2006, 2009    Social History Social History  Substance Use Topics  . Smoking status: Former Research scientist (life sciences)  . Smokeless tobacco: Not on file  . Alcohol use No    Family History Family History  Problem Relation Age of Onset  . Prostate cancer Neg Hx   . Bladder Cancer Neg Hx   . Kidney cancer Neg Hx   No family history of bleeding/clotting disorders, porphyria or autoimmune disease   No Known Allergies   REVIEW OF SYSTEMS (Negative unless checked)  Constitutional: [] Weight loss  [] Fever  [] Chills Cardiac: [] Chest pain   [] Chest pressure   [] Palpitations   [] Shortness of breath when laying flat   [] Shortness of breath with exertion. Vascular:  []   Pain in legs with walking   [] Pain in legs at rest  [x] History of DVT   [] Phlebitis   [x] Swelling in legs   [] Varicose veins   [] Non-healing ulcers Pulmonary:   [] Uses home oxygen   [] Productive cough   [] Hemoptysis   [] Wheeze  [] COPD   [] Asthma Neurologic:  [] Dizziness   [] Seizures   [] History of stroke   [] History of TIA  [] Aphasia   [] Vissual changes   [] Weakness or numbness in arm   [] Weakness or numbness in leg Musculoskeletal:   [] Joint swelling   [] Joint pain   [] Low back pain Hematologic:  [x] Easy bruising  [] Easy bleeding   [] Hypercoagulable state   [] Anemic Gastrointestinal:  [] Diarrhea   [] Vomiting  [] Gastroesophageal reflux/heartburn    [] Difficulty swallowing. Genitourinary:  [] Chronic kidney disease   [] Difficult urination  [] Frequent urination   [] Blood in urine Skin:  [] Rashes   [] Ulcers  Psychological:  [] History of anxiety   []  History of major depression.  Physical Examination  Vitals:   05/11/16 0846  BP: 137/75  Pulse: 74  Resp: 16  Weight: 219 lb (99.3 kg)  Height: 5\' 10"  (1.778 m)   Body mass index is 31.42 kg/m. Gen: WD/WN, NAD Head: [place]/AT, No temporalis wasting.  Ear/Nose/Throat: Hearing grossly intact, nares w/o erythema or drainage, poor dentition Eyes: PER, EOMI, sclera nonicteric.  Neck: Supple, no masses.  No bruit or JVD.  Pulmonary:  Good air movement, clear to auscultation bilaterally, no use of accessory muscles.  Cardiac: RRR, normal S1, S2, no Murmurs. Vascular: 2+ edema bilaterally with mild venous changes bilaterally Vessel Right Left  Radial Palpable Palpable  Ulnar Palpable Palpable  Brachial Palpable Palpable  Carotid Palpable Palpable  Femoral Palpable Palpable  Popliteal Palpable Palpable  PT Palpable Palpable  DP Palpable Palpable   Gastrointestinal: soft, non-distended. No guarding/no peritoneal signs.  Musculoskeletal: M/S 4/5 lower extremities and 5/5 upper extremities.  No deformity but mild  atrophy. Uses a walker Neurologic: CN 2-12 intact. Pain and light touch intact in extremities.  Symmetrical.  Speech is fluent. Motor exam as listed above. Psychiatric: Judgment intact, Mood & affect appropriate for pt's clinical situation. Dermatologic: No rashes or ulcers noted.  No changes consistent with cellulitis. Lymph : No Cervical lymphadenopathy, no lichenification or skin changes of chronic lymphedema.  CBC Lab Results  Component Value Date   WBC 3.5 (L) 01/09/2016   HGB 8.1 (L) 01/09/2016   HCT 23.3 (L) 01/09/2016   MCV 84.9 01/09/2016   PLT 407 01/09/2016    BMET    Component Value Date/Time   NA 137 01/09/2016 0853   NA 138 02/24/2012 0529   K 3.9  01/09/2016 0853   K 3.5 02/24/2012 0529   CL 107 01/09/2016 0853   CL 101 02/24/2012 0529   CO2 23 01/09/2016 0853   CO2 28 02/24/2012 0529   GLUCOSE 132 (H) 01/09/2016 0853   GLUCOSE 100 (H) 02/24/2012 0529   BUN 22 (H) 01/09/2016 0853   BUN 18 02/24/2012 0529   CREATININE 1.09 01/09/2016 0853   CREATININE 0.91 02/24/2012 0529   CALCIUM 8.2 (L) 01/09/2016 0853   CALCIUM 8.4 (L) 02/24/2012 0529   GFRNONAA 58 (L) 01/09/2016 0853   GFRNONAA >60 02/24/2012 0529   GFRAA >60 01/09/2016 0853   GFRAA >60 02/24/2012 0529   CrCl cannot be calculated (Patient's most recent lab result is older than the maximum 21 days allowed.).  COAG Lab Results  Component Value Date   INR 1.14 12/11/2015  Radiology No results found.  Assessment/Plan: 1. Pulmonary Embolus:  The patient has been on anticoagulation with Eliquis for 4 months with out problem.  Therefore it is appropriate and indicated to remove the IVC filter.  Risk and benefits were reviewed the patient.  Indications for the procedure were reviewed.  All questions were answered, the patient agrees to proceed with filter removal.   2.  Hypertension:  He will continue his Norvasc no changes  3.  Bladder CA:  He will continue his follow up with oncology.  He is due next week to have his hypercoagulable panel drawn.  4.  COPD:  Continue aerosol treatment no changes   Hortencia Pilar, MD  05/11/2016 9:12 PM

## 2016-05-12 ENCOUNTER — Encounter (INDEPENDENT_AMBULATORY_CARE_PROVIDER_SITE_OTHER): Payer: Self-pay

## 2016-05-13 ENCOUNTER — Other Ambulatory Visit (INDEPENDENT_AMBULATORY_CARE_PROVIDER_SITE_OTHER): Payer: Self-pay | Admitting: Vascular Surgery

## 2016-05-19 ENCOUNTER — Ambulatory Visit
Admission: RE | Admit: 2016-05-19 | Discharge: 2016-05-19 | Disposition: A | Discharge status: Home / Self Care | Payer: Medicare Other | Source: Ambulatory Visit | Attending: Vascular Surgery | Admitting: Vascular Surgery

## 2016-05-19 ENCOUNTER — Encounter
Admission: RE | Disposition: A | Discharge status: Home / Self Care | Payer: Self-pay | Source: Ambulatory Visit | Attending: Vascular Surgery

## 2016-05-19 ENCOUNTER — Encounter: Payer: Self-pay | Admitting: *Deleted

## 2016-05-19 DIAGNOSIS — Z452 Encounter for adjustment and management of vascular access device: Secondary | ICD-10-CM | POA: Diagnosis not present

## 2016-05-19 DIAGNOSIS — Z86718 Personal history of other venous thrombosis and embolism: Secondary | ICD-10-CM | POA: Insufficient documentation

## 2016-05-19 DIAGNOSIS — Z87440 Personal history of urinary (tract) infections: Secondary | ICD-10-CM | POA: Diagnosis not present

## 2016-05-19 DIAGNOSIS — C679 Malignant neoplasm of bladder, unspecified: Secondary | ICD-10-CM | POA: Diagnosis not present

## 2016-05-19 DIAGNOSIS — I2602 Saddle embolus of pulmonary artery with acute cor pulmonale: Secondary | ICD-10-CM | POA: Diagnosis not present

## 2016-05-19 DIAGNOSIS — D649 Anemia, unspecified: Secondary | ICD-10-CM | POA: Insufficient documentation

## 2016-05-19 DIAGNOSIS — K219 Gastro-esophageal reflux disease without esophagitis: Secondary | ICD-10-CM | POA: Diagnosis not present

## 2016-05-19 DIAGNOSIS — Z96659 Presence of unspecified artificial knee joint: Secondary | ICD-10-CM | POA: Insufficient documentation

## 2016-05-19 DIAGNOSIS — I10 Essential (primary) hypertension: Secondary | ICD-10-CM | POA: Insufficient documentation

## 2016-05-19 DIAGNOSIS — I82409 Acute embolism and thrombosis of unspecified deep veins of unspecified lower extremity: Secondary | ICD-10-CM | POA: Diagnosis not present

## 2016-05-19 DIAGNOSIS — M1990 Unspecified osteoarthritis, unspecified site: Secondary | ICD-10-CM | POA: Diagnosis not present

## 2016-05-19 DIAGNOSIS — J449 Chronic obstructive pulmonary disease, unspecified: Secondary | ICD-10-CM | POA: Insufficient documentation

## 2016-05-19 HISTORY — PX: PERIPHERAL VASCULAR CATHETERIZATION: SHX172C

## 2016-05-19 SURGERY — IVC FILTER REMOVAL
Anesthesia: Moderate Sedation

## 2016-05-19 MED ORDER — DEXTROSE 5 % IV SOLN
INTRAVENOUS | Status: AC
  Filled 2016-05-19: qty 1.5

## 2016-05-19 MED ORDER — MIDAZOLAM HCL 2 MG/2ML IJ SOLN
INTRAMUSCULAR | Status: DC | PRN
  Administered 2016-05-19 (×2): 1 mg via INTRAVENOUS

## 2016-05-19 MED ORDER — FENTANYL CITRATE (PF) 100 MCG/2ML IJ SOLN
INTRAMUSCULAR | Status: AC
  Filled 2016-05-19: qty 2

## 2016-05-19 MED ORDER — DEXTROSE 5 % IV SOLN
1.5000 g | INTRAVENOUS | Status: AC
  Administered 2016-05-19: 1.5 g via INTRAVENOUS

## 2016-05-19 MED ORDER — IOPAMIDOL (ISOVUE-300) INJECTION 61%
INTRAVENOUS | Status: DC | PRN
  Administered 2016-05-19: 15 mL via INTRAVENOUS

## 2016-05-19 MED ORDER — FENTANYL CITRATE (PF) 100 MCG/2ML IJ SOLN
INTRAMUSCULAR | Status: DC | PRN
  Administered 2016-05-19 (×2): 50 ug via INTRAVENOUS

## 2016-05-19 MED ORDER — MIDAZOLAM HCL 2 MG/2ML IJ SOLN
INTRAMUSCULAR | Status: AC
  Filled 2016-05-19: qty 2

## 2016-05-19 MED ORDER — HEPARIN (PORCINE) IN NACL 2-0.9 UNIT/ML-% IJ SOLN
INTRAMUSCULAR | Status: AC
  Filled 2016-05-19: qty 500

## 2016-05-19 MED ORDER — SODIUM CHLORIDE 0.9 % IV SOLN
INTRAVENOUS | Status: DC
Start: 2016-05-19 — End: 2016-05-19
  Administered 2016-05-19: 08:00:00 via INTRAVENOUS

## 2016-05-19 SURGICAL SUPPLY — 4 items
PACK ANGIOGRAPHY (CUSTOM PROCEDURE TRAY) ×2 IMPLANT
SET VENACAVA FILTER RETRIEVAL (MISCELLANEOUS) ×2 IMPLANT
TOWEL OR 17X26 4PK STRL BLUE (TOWEL DISPOSABLE) ×2 IMPLANT
WIRE J 3MM .035X145CM (WIRE) ×2 IMPLANT

## 2016-05-19 NOTE — Discharge Instructions (Signed)
Inferior Vena Cava Filter rremoval, Care After Refer to this sheet in the next few weeks. These instructions provide you with information on caring for yourself after your procedure. Your health care provider may also give you more specific instructions. Your treatment has been planned according to current medical practices, but problems sometimes occur. Call your health care provider if you have any problems or questions after your procedure. WHAT TO EXPECT AFTER THE PROCEDURE After your procedure, it is typical to have the following:  Mild pain in the area where the filter was removed.  Mild bruising in the area where the filter was removed. HOME CARE INSTRUCTIONS  You will be given medicine to control pain. Only take over-the-counter or prescription medicines for pain, fever, or discomfort as directed by your health care provider.  A bandage (dressing) has been placed over the insertion site. Follow your health care provider's instructions on how to care for it.  Keep the insertion site clean and dry.  Do not soak in a bath tub or pool until the filter insertion site has healed.  Do not drive if you are taking narcotic pain medicines. Follow your health care provider's instructions about driving.  Do not return to work or school until your health care provider says it is okay.   Keep all follow-up appointments.  SEEK IMMEDIATE MEDICAL CARE IF:  Your legs become pale and cold or blue.  You develop shortness of breath, feel faint, or pass out.  You develop chest pain, a cough, or difficulty breathing.  You cough up blood.  You develop a rash or feel you are having problems that may be a side effect of medicines.  You develop weakness, difficulty moving your arms or legs, or balance problems.  You develop problems with speech or vision.   This information is not intended to replace advice given to you by your health care provider. Make sure you discuss any questions you have  with your health care provider.   Document Released: 04/26/2013 Document Reviewed: 04/26/2013 Elsevier Interactive Patient Education [employer].

## 2016-05-19 NOTE — Op Note (Signed)
  OPERATIVE NOTE   PRE-OPERATIVE DIAGNOSIS: DVT with PE  POST-OPERATIVE DIAGNOSIS: Same  PROCEDURE: 1. Retrieval of IVC Filter 2. Inferior Vena Cavagram  SURGEON: [name], M.D.  ANESTHESIA:  Conscious sedation was administered under my direct supervision. IV Versed plus fentanyl were utilized. Continuous ECG, pulse oximetry and blood pressure was monitored throughout the entire procedure. Conscious sedation was for a total of 25 minutes.  ESTIMATED BLOOD LOSS: Minimal cc  FINDING(S):inferior vena cava is widely patent filter is in place in good position. Filter is removed without incident  SPECIMEN(S):  IVC filter intact  INDICATIONS:   Geoffrey Barber is a 82 y.o. male who presents with DVT and PE. The patient has now tolerated anticoagulation for several months. Therefore, the IVC filter is recommended to be removed. The risks and benefits were reviewed with the patient all questions were answered and they agreed to proceed with IVC filter retrieval. Oral anticoagulation will be continued.  DESCRIPTION: After obtaining full informed written consent, the patient was brought back to the Special Procedure Suite and placed in the supine position.  The patient received IV antibiotics prior to induction.  After obtaining adequate sedation, the patient was prepped and draped in the standard fashion and appropriate time out is called.     Ultrasound was placed in a sterile sleeve.The right neck was then imaged with ultrasound.   Jugular vein was identified it is echolucent and homogeneous indicating patency. 1% lidocaine is then infiltrated under ultrasound visualization and subsequently a Seldinger needle is inserted under real-time ultrasound guidance.  J-wire is then advanced into the inferior vena cava under fluoroscopic guidance. With the tip of the sheath at the confluence of the iliac veins inferior vena caval imaging is performed.  After review of the image the sheath is  repositioned to above the filter and the snares introduced. Snares opened and the hook is secured without difficulty. The filter is then collapsed within the sheath and removed without difficulty.  Sheath is removed by pressures held the patient tolerated the procedure well and there were no immediate complications.  Interpretation: inferior vena cava is widely patent filter is in place in good position. Filter is removed without incident.     COMPLICATIONS: None  CONDITION: [name], M.D. [facility] Office: [phone]   05/19/2016, 9:49 AM

## 2016-05-19 NOTE — H&P (Signed)
[facility] History & Physical Update  The patient was interviewed and re-examined.  The patient's previous History and Physical has been reviewed and is unchanged.  There is no change in the plan of care. We plan to proceed with the scheduled procedure.  Hortencia Pilar, MD  05/19/2016, 8:48 AM

## 2016-05-20 ENCOUNTER — Encounter: Payer: Self-pay | Admitting: Vascular Surgery

## 2016-06-04 ENCOUNTER — Encounter (INDEPENDENT_AMBULATORY_CARE_PROVIDER_SITE_OTHER): Payer: Self-pay | Admitting: Vascular Surgery

## 2016-06-04 ENCOUNTER — Ambulatory Visit (INDEPENDENT_AMBULATORY_CARE_PROVIDER_SITE_OTHER): Payer: Medicare Other | Admitting: Vascular Surgery

## 2016-06-04 VITALS — BP 126/71 | HR 76 | Resp 16 | Ht 72.0 in | Wt 219.0 lb

## 2016-06-04 DIAGNOSIS — I2602 Saddle embolus of pulmonary artery with acute cor pulmonale: Secondary | ICD-10-CM

## 2016-06-04 DIAGNOSIS — M159 Polyosteoarthritis, unspecified: Secondary | ICD-10-CM

## 2016-06-04 DIAGNOSIS — C679 Malignant neoplasm of bladder, unspecified: Secondary | ICD-10-CM | POA: Diagnosis not present

## 2016-06-04 DIAGNOSIS — I10 Essential (primary) hypertension: Secondary | ICD-10-CM

## 2016-06-04 DIAGNOSIS — M150 Primary generalized (osteo)arthritis: Secondary | ICD-10-CM

## 2016-06-04 DIAGNOSIS — M1990 Unspecified osteoarthritis, unspecified site: Secondary | ICD-10-CM | POA: Insufficient documentation

## 2016-06-04 NOTE — Progress Notes (Signed)
MRN : [id number]  Geoffrey Barber is a 82 y.o. (1925-01-03) male who presents with chief complaint of  Chief Complaint  Patient presents with  . Re-evaluation    2 weeks post op  .  History of Present Illness: The patient is s/p IVC filter removal.  He has no c/o.  Current Meds  Medication Sig  . acetaminophen (TYLENOL) 325 MG tablet Take 650 mg by mouth every 6 (six) hours as needed for mild pain.  Marland Kitchen amLODipine (NORVASC) 5 MG tablet Take 2.5 mg by mouth daily.   Marland Kitchen apixaban (ELIQUIS) 5 MG TABS tablet Take 5 mg by mouth 2 (two) times daily.  . Cholecalciferol (VITAMIN D3) 1000 units CAPS Take 1 capsule by mouth daily.  . dapsone 25 MG tablet Take 25 mg by mouth every other day.  . ferrous sulfate (FERROUSUL) 325 (65 FE) MG tablet Take 1 tablet (325 mg total) by mouth 2 (two) times daily with a meal. (Patient taking differently: Take 325 mg by mouth daily with breakfast. )  . Fluticasone-Salmeterol (ADVAIR DISKUS) 250-50 MCG/DOSE AEPB Inhale 1 puff into the lungs 2 (two) times daily.  Marland Kitchen HYDROcodone-acetaminophen (NORCO/VICODIN) 5-325 MG tablet Take 1 tablet by mouth every 4 (four) hours as needed for moderate pain.  Marland Kitchen ipratropium-albuterol (DUONEB) 0.5-2.5 (3) MG/3ML SOLN Take 3 mLs by nebulization every 6 (six) hours as needed.  Marland Kitchen lisinopril (PRINIVIL,ZESTRIL) 20 MG tablet Take 10 mg by mouth daily.  . Multiple Vitamins-Minerals (PRESERVISION AREDS) CAPS Take 1 capsule by mouth 2 (two) times daily.  Marland Kitchen omeprazole (PRILOSEC) 20 MG capsule Take 20 mg by mouth daily.  Marland Kitchen opium-belladonna (B&O SUPPRETTES) 16.2-60 MG suppository Place 1 suppository rectally every 8 (eight) hours as needed for bladder spasms.  Marland Kitchen psyllium (METAMUCIL) 58.6 % powder Take 1 packet by mouth daily.  . Salicylic Acid 40 % MISC Apply 1 application topically every other day.  . sodium chloride (OCEAN) 0.65 % SOLN nasal spray Place 1 spray into both nostrils at bedtime.    Past Medical History:  Diagnosis Date  .  Anemia   . Arthritis   . BPH without obstruction/lower urinary tract symptoms   . Difficulty walking   . GERD (gastroesophageal reflux disease)   . Hypertension   . Irradiation cystitis with hematuria   . Muscle weakness   . PE (pulmonary embolism)   . Pneumonia   . Prostate cancer ([place])   . UTI (lower urinary tract infection)     Past Surgical History:  Procedure Laterality Date  . PERIPHERAL VASCULAR CATHETERIZATION N/A 12/27/2015   Procedure: IVC Filter Insertion;  Surgeon: [name], MD;  Location: [facility];  Service: Cardiovascular;  Laterality: N/A;  . PERIPHERAL VASCULAR CATHETERIZATION N/A 05/19/2016   Procedure: IVC Filter Removal;  Surgeon: [name], MD;  Location: [facility];  Service: Cardiovascular;  Laterality: N/A;  . Rectal Cyst Removal  1954  . TOTAL KNEE ARTHROPLASTY  2006, 2009    Social History Social History  Substance Use Topics  . Smoking status: Former Research scientist (life sciences)  . Smokeless tobacco: Never Used  . Alcohol use 3.6 oz/week    6 Glasses of wine per week    Family History Family History  Problem Relation Age of Onset  . Prostate cancer Neg Hx   . Bladder Cancer Neg Hx   . Kidney cancer Neg Hx   No family history of bleeding/clotting disorders, porphyria or autoimmune disease   No Known Allergies  REVIEW OF SYSTEMS (Negative unless checked)  Constitutional: [] Weight loss  [] Fever  [] Chills Cardiac: [] Chest pain   [] Chest pressure   [] Palpitations   [] Shortness of breath when laying flat   [] Shortness of breath with exertion. Vascular:  [] Pain in legs with walking   [] Pain in legs at rest  [] History of DVT   [] Phlebitis   [] Swelling in legs   [] Varicose veins   [] Non-healing ulcers Pulmonary:   [] Uses home oxygen   [] Productive cough   [] Hemoptysis   [] Wheeze  [] COPD   [] Asthma Neurologic:  [] Dizziness   [] Seizures   [] History of stroke   [] History of TIA  [] Aphasia   [] Vissual changes   [] Weakness or numbness in  arm   [] Weakness or numbness in leg Musculoskeletal:   [] Joint swelling   [] Joint pain   [] Low back pain Hematologic:  [] Easy bruising  [] Easy bleeding   [] Hypercoagulable state   [] Anemic Gastrointestinal:  [] Diarrhea   [] Vomiting  [] Gastroesophageal reflux/heartburn   [] Difficulty swallowing. Genitourinary:  [] Chronic kidney disease   [] Difficult urination  [] Frequent urination   [] Blood in urine Skin:  [] Rashes   [] Ulcers  Psychological:  [] History of anxiety   []  History of major depression.  Physical Examination  Vitals:   06/04/16 1402  BP: 126/71  Pulse: 76  Resp: 16  Weight: 219 lb (99.3 kg)  Height: 6' (1.829 m)   Body mass index is 29.7 kg/m. Gen: WD/WN, NAD Head: [place]/AT, No temporalis wasting.  Ear/Nose/Throat: Hearing grossly intact, nares w/o erythema or drainage, poor dentition Eyes: PER, EOMI, sclera nonicteric.  Neck: Supple, no masses.  No bruit or JVD.  Pulmonary:  Good air movement, clear to auscultation bilaterally, no use of accessory muscles.  Cardiac: RRR, normal S1, S2, no Murmurs. Vascular:  Vessel Right Left  Radial Palpable Palpable  Ulnar Palpable Palpable  Brachial Palpable Palpable  Carotid Palpable Palpable  Femoral Palpable Palpable  Popliteal Palpable Palpable  PT Palpable Palpable  DP Palpable Palpable   Gastrointestinal: soft, non-distended. No guarding/no peritoneal signs.  Musculoskeletal: M/S 5/5 throughout.  No deformity or atrophy.  Neurologic: CN 2-12 intact. Pain and light touch intact in extremities.  Symmetrical.  Speech is fluent. Motor exam as listed above. Psychiatric: Judgment intact, Mood & affect appropriate for pt's clinical situation. Dermatologic: No rashes or ulcers noted.  No changes consistent with cellulitis. Lymph : No Cervical lymphadenopathy, no lichenification or skin changes of chronic lymphedema.  CBC Lab Results  Component Value Date   WBC 3.5 (L) 01/09/2016   HGB 8.1 (L) 01/09/2016   HCT 23.3 (L)  01/09/2016   MCV 84.9 01/09/2016   PLT 407 01/09/2016    BMET    Component Value Date/Time   NA 137 01/09/2016 0853   NA 138 02/24/2012 0529   K 3.9 01/09/2016 0853   K 3.5 02/24/2012 0529   CL 107 01/09/2016 0853   CL 101 02/24/2012 0529   CO2 23 01/09/2016 0853   CO2 28 02/24/2012 0529   GLUCOSE 132 (H) 01/09/2016 0853   GLUCOSE 100 (H) 02/24/2012 0529   BUN 22 (H) 01/09/2016 0853   BUN 18 02/24/2012 0529   CREATININE 1.09 01/09/2016 0853   CREATININE 0.91 02/24/2012 0529   CALCIUM 8.2 (L) 01/09/2016 0853   CALCIUM 8.4 (L) 02/24/2012 0529   GFRNONAA 58 (L) 01/09/2016 0853   GFRNONAA >60 02/24/2012 0529   GFRAA >60 01/09/2016 0853   GFRAA >60 02/24/2012 0529   CrCl cannot be calculated (Patient's most recent  lab result is older than the maximum 21 days allowed.).  COAG Lab Results  Component Value Date   INR 1.14 12/11/2015    Radiology No results found.  Assessment/Plan 1. Acute saddle pulmonary embolism with acute cor pulmonale (HCC) IVC filter removed no complications  Follow up PRN  2. Accelerated hypertension Continue antihypertensive medications as already ordered and reviewed, no changes at this time.  Continue statin as ordered and reviewed, no changes at this time  3. Malignant neoplasm of urinary bladder, unspecified site [facility]) Continue follow up with oncology  4. Primary osteoarthritis involving multiple joints Continue NSAID    Hortencia Pilar, MD  06/04/2016 2:25 PM

## 2017-05-26 ENCOUNTER — Encounter (INDEPENDENT_AMBULATORY_CARE_PROVIDER_SITE_OTHER): Payer: Self-pay

## 2017-11-04 IMAGING — CT CT RENAL STONE PROTOCOL
3 of 4 series · 9 of 46 positions shown, 14 images · non-contrast
Comparison: CT 06/16/2005 as well as pelvic MRI 01/18/2012

CLINICAL DATA: History of prostate cancer and perform self
catheterization. Bleeding on self catheterization today.

EXAM:
CT ABDOMEN AND PELVIS WITHOUT CONTRAST
TECHNIQUE: Multidetector CT imaging of the abdomen and pelvis was performed
following the standard protocol without IV contrast.

[Series 4: lung · axial · 0.72mm/px · z∈[+408,+502]mm · 5 of 29 slices shown, 10 images]
[im 5/29  soft-tissue]
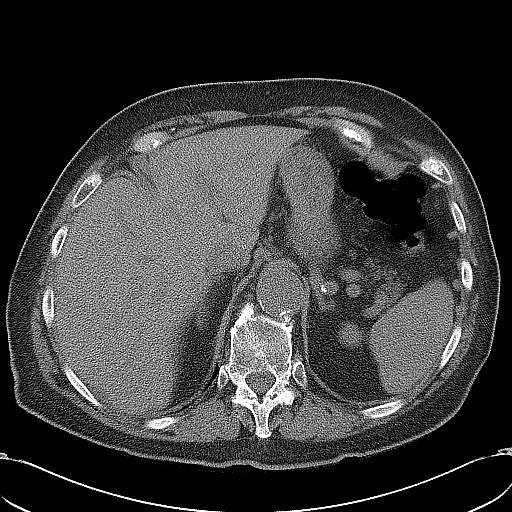
[im 5/29  bone]
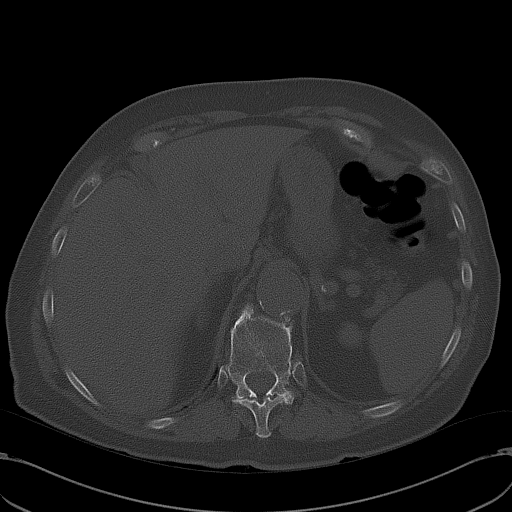
[im 10/29  soft-tissue]
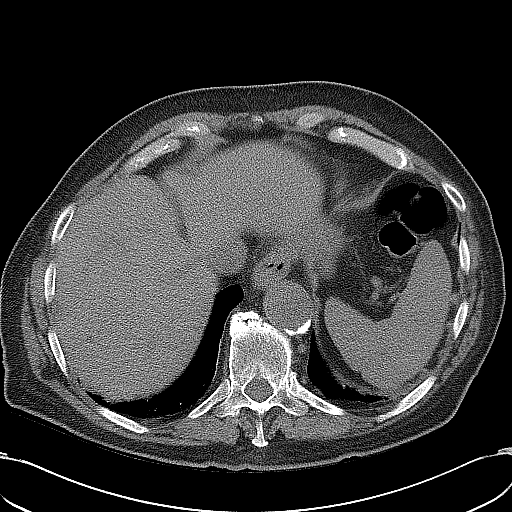
[im 10/29  lung]
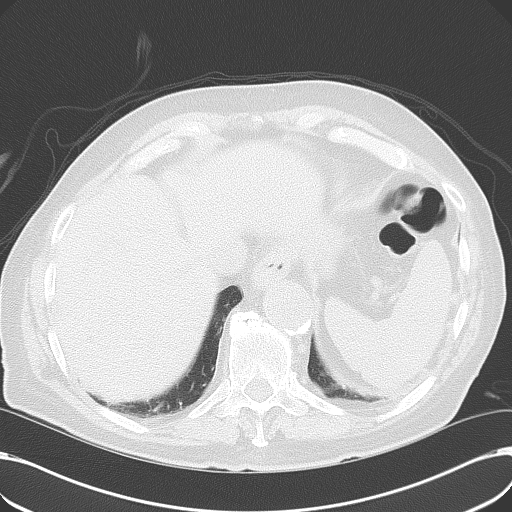
[im 15/29  soft-tissue]
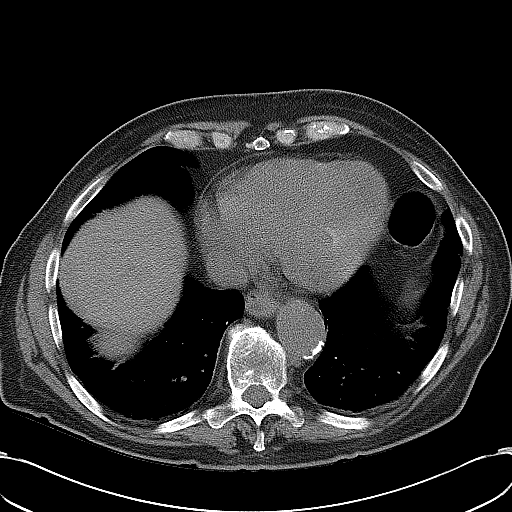
[im 15/29  lung]
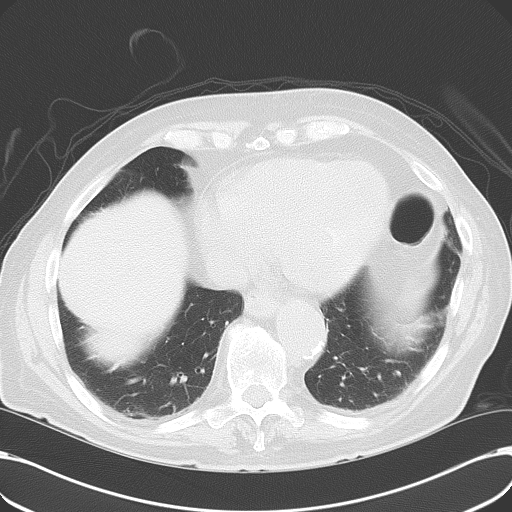
[im 19/29  soft-tissue]
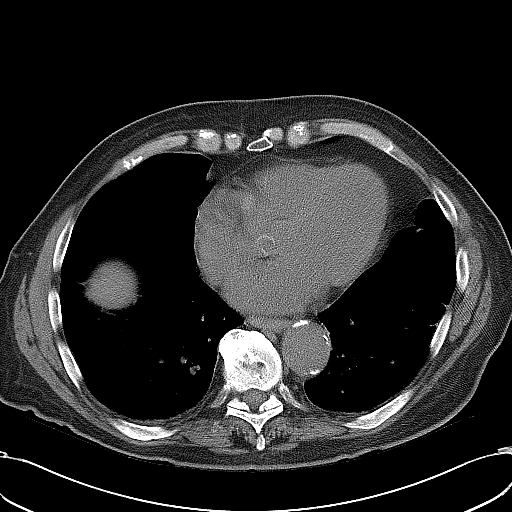
[im 19/29  lung]
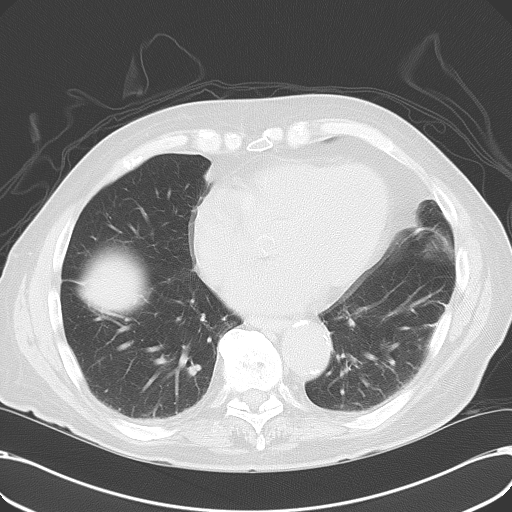
[im 24/29  soft-tissue]
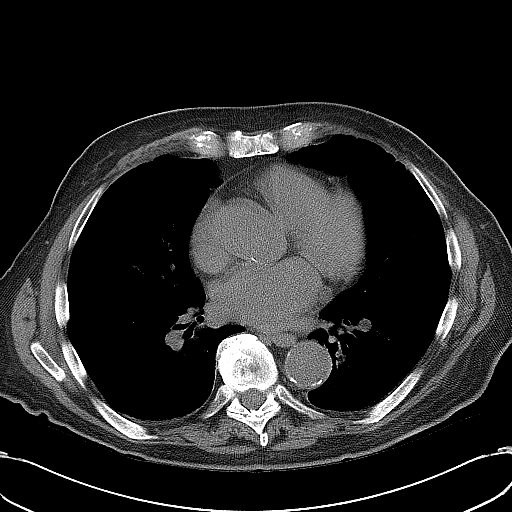
[im 24/29  lung]
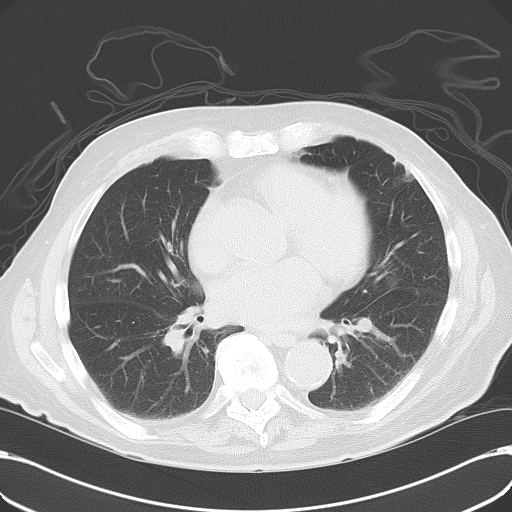

[Series 5: coronal · coronal · 0.74mm/px · 3 of 145 slices shown]
[im 49/145  soft-tissue]
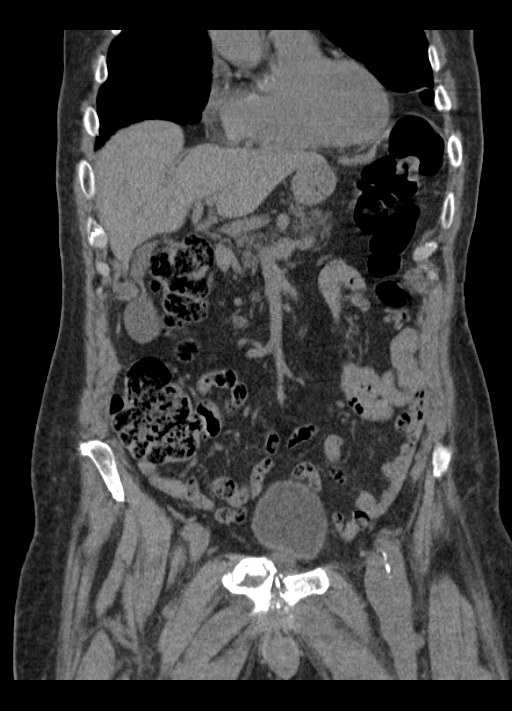
[im 65/145  soft-tissue]
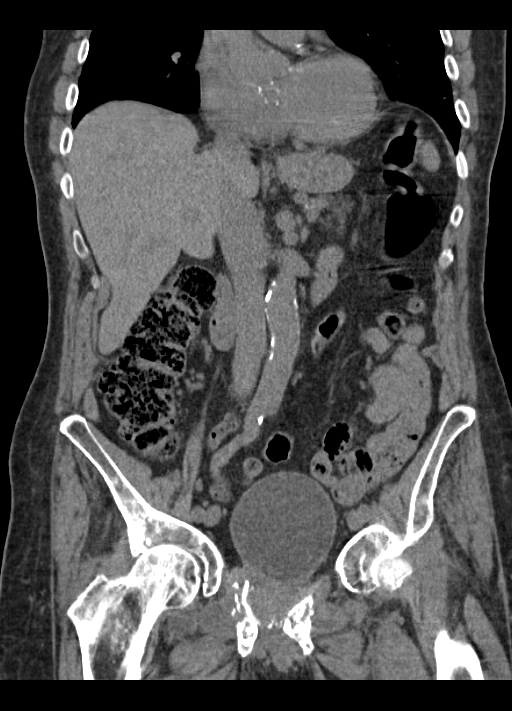
[im 81/145  soft-tissue]
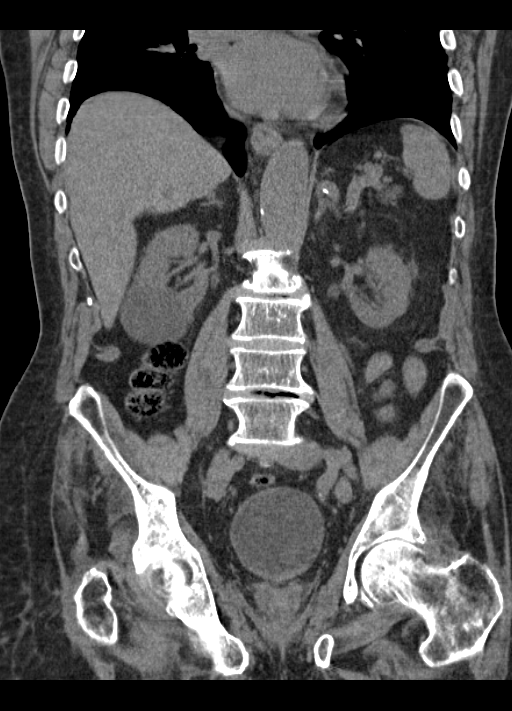

[Series 6: sagittal · sagittal · 0.64mm/px · 1 of 195 slices shown]
[im 65/195  soft-tissue]
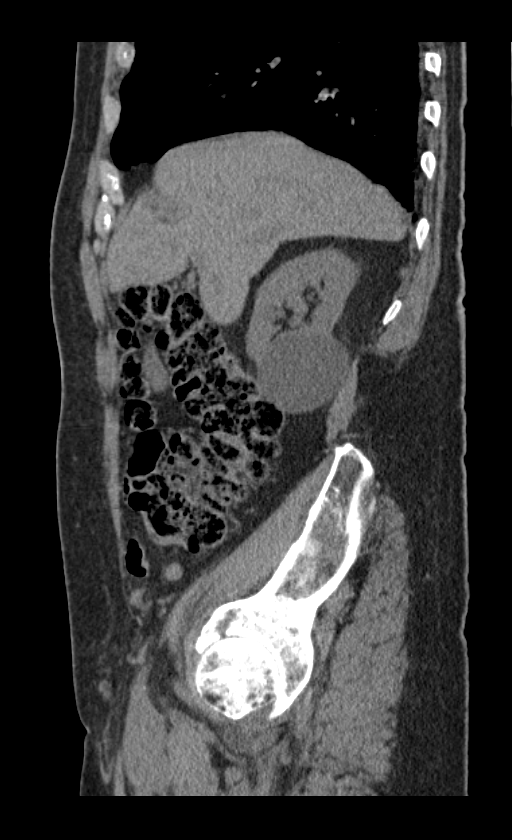

[9 of 46 positions shown; findings below may reference images not displayed]

FINDINGS: Lung bases demonstrate minimal scarring/ atelectasis over the
lingula and left base. Calcified plaque over the left anterior
descending coronary artery.

Abdominal images demonstrate a few sub cm liver hypodensity is too
small to characterize but likely cysts. The gallbladder, spleen,
pancreas and adrenal glands are within normal.

Kidneys are normal in size without evidence of hydronephrosis.
Subtle smooth shaped stranding of the perirenal fat. Possible subtle
faint cortical calcification upper pole right kidney. There is
cm simple cyst over the lower pole right kidney. Ureters are within
normal.

There is calcified plaque over the abdominal aorta iliac arteries.

Appendix is normal. Colon is within normal. Small bowel is
unremarkable.

Pelvic images demonstrate a a focal dependent region of hyperdensity
along the posterior bladder wall measuring 1.3 x 2.7 cm in AP and
transverse dimension. This likely represents focal hemorrhage,
although cannot exclude a mass. Rectum is normal. There is no free
fluid or adenopathy.

Examination demonstrates chronic displaced fracture of the right
superior pubic ramus and bilateral symphysis including junction of
inferior pubic rami to the symphysis. Old right inferior pubic ramus
fracture. These findings were present on the previous MRI. The There
is a nondisplaced fracture across the left superior acetabulum
vessels in minimal displaced slightly comminuted fracture of the
superior right acetabulum as these likely acute to subacute in
nature.

There are moderate degenerative changes of the spine with multilevel
disc disease over the lumbar spine. Moderate degenerative change of
the hips.
IMPRESSION: 1.3 x 2.7 cm hyperdense collection along the posterior dependent
portion of the bladder likely a focal region of hemorrhage, although
cannot exclude a mass. No adenopathy.

No renal/ureteral stones or obstruction.

6.4 cm right renal cyst.

Few small subcentimeter liver hypodensities likely cysts or
hemangiomas.

Displaced somewhat comminuted chronic fracture involving the right
superior ramus and bilateral symphysis including the junction of the
inferior pubic rami bilaterally. Acute to subacute fracture left
superior acetabulum without significant displacement as well as
right acetabulum fracture with mild displacement and comminution.
Atherosclerotic coronary artery disease.

## 2018-02-24 ENCOUNTER — Encounter: Payer: Self-pay | Admitting: Urology

## 2018-02-24 ENCOUNTER — Other Ambulatory Visit: Payer: Self-pay

## 2018-02-24 ENCOUNTER — Ambulatory Visit (INDEPENDENT_AMBULATORY_CARE_PROVIDER_SITE_OTHER): Payer: Medicare Other | Admitting: Urology

## 2018-02-24 ENCOUNTER — Encounter

## 2018-02-24 VITALS — BP 110/64 | HR 66 | Ht 72.0 in | Wt 198.4 lb

## 2018-02-24 DIAGNOSIS — C679 Malignant neoplasm of bladder, unspecified: Secondary | ICD-10-CM

## 2018-02-24 LAB — BLADDER SCAN AMB NON-IMAGING: Scan Result: 0

## 2018-02-24 NOTE — Progress Notes (Signed)
02/24/2018 1:09 PM   Geoffrey Barber 1925-04-04 [id number]  Referring provider: Derinda Late, MD [phone] [facility] [place], [place] [address]  Chief Complaint  Patient presents with  . Urinary Incontinence    HPI: 84 year old male who was scheduled as a transfer of care from Dr. Jacqlyn Larsen at [facility] however he last saw Dr. Jacqlyn Larsen in 2015 and was previously seen at this practice in 2017 by both Dr. Erlene Quan and Pilar Jarvis.  He has a history of prostate cancer status post radiation.  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  Upper tract imaging was unremarkable.  He denies recurrent gross hematuria.  He has a history of urethral stricture and was previously on intermittent catheterization when he was seen Dr. Jacqlyn Larsen.  He presents today complaining of urinary incontinence.  He has daytime frequency, urgency with significant incontinence.  He also complains of nocturia x3-4 and nighttime urine loss.  He wears depends at night and uses up to 6 poise ultra long pads during the daytime.  He has requested placement of a chronic indwelling Foley catheter to manage his incontinence.  He is on Plavix.  PMH: Past Medical History:  Diagnosis Date  . Anemia   . Arthritis   . BPH without obstruction/lower urinary tract symptoms   . Difficulty walking   . GERD (gastroesophageal reflux disease)   . Hypertension   . Irradiation cystitis with hematuria   . Muscle weakness   . PE (pulmonary embolism)   . Pneumonia   . Prostate cancer ([place])   . UTI (lower urinary tract infection)     Surgical History: Past Surgical History:  Procedure Laterality Date  . PERIPHERAL VASCULAR CATHETERIZATION N/A 12/27/2015   Procedure: IVC Filter Insertion;  Surgeon: [name], MD;  Location: [facility];  Service:  Cardiovascular;  Laterality: N/A;  . PERIPHERAL VASCULAR CATHETERIZATION N/A 05/19/2016   Procedure: IVC Filter Removal;  Surgeon: [name], MD;  Location: [facility];  Service: Cardiovascular;  Laterality: N/A;  . Rectal Cyst Removal  1954  . TOTAL KNEE ARTHROPLASTY  2006, 2009    Home Medications:  Allergies as of 02/24/2018      Reactions   Furosemide Rash      Medication List        Accurate as of 02/24/18  1:09 PM. Always use your most recent med list.          CALCIUM 600 600 MG Tabs tablet Generic drug:  calcium carbonate Take 600 mg by mouth 2 (two) times daily with a meal.   carboxymethylcellulose 0.5 % Soln Commonly known as:  REFRESH PLUS 1 drop 3 (three) times daily as needed.   clopidogrel 75 MG tablet Commonly known as:  PLAVIX Take 75 mg by mouth daily.   dapsone 25 MG tablet Take 25 mg by mouth daily.   ferrous sulfate 325 (65 FE) MG tablet Take 325 mg by mouth daily with breakfast.   hydrochlorothiazide 25 MG tablet Commonly known as:  HYDRODIURIL Take 25 mg by mouth daily.   lisinopril 20 MG tablet Commonly known as:  PRINIVIL,ZESTRIL Take 20 mg by mouth daily.   omeprazole 20 MG capsule Commonly known as:  PRILOSEC Take 20 mg by mouth daily.   POISE MAXIMUM ABSORBENCY Pads by Does not apply route.   potassium chloride  SA 20 MEQ tablet Commonly known as:  K-DUR,KLOR-CON Take 20 mEq by mouth 2 (two) times daily.   PRESERVISION AREDS 2+MULTI VIT PO Take by mouth.   psyllium 58.6 % powder Commonly known as:  METAMUCIL Take 1 packet by mouth daily.   tamsulosin 0.4 MG Caps capsule Commonly known as:  FLOMAX Take 0.4 mg by mouth.   triamcinolone cream 0.1 % Commonly known as:  KENALOG Apply 1 application topically 2 (two) times daily.       Allergies:  Allergies  Allergen Reactions  . Furosemide Rash    Family History: Family History  Problem Relation Age of Onset  . Prostate cancer Neg Hx   . Bladder  Cancer Neg Hx   . Kidney cancer Neg Hx     Social History:  reports that he has quit smoking. He has never used smokeless tobacco. He reports that he drinks about 6.0 standard drinks of alcohol per week. His drug history is not on file.  ROS: UROLOGY Frequent Urination?: Yes Hard to postpone urination?: Yes Burning/pain with urination?: No Get up at night to urinate?: Yes Leakage of urine?: Yes Urine stream starts and stops?: Yes Trouble starting stream?: No Do you have to strain to urinate?: No Blood in urine?: No Urinary tract infection?: No Sexually transmitted disease?: No Injury to kidneys or bladder?: No Painful intercourse?: No Weak stream?: Yes Erection problems?: Yes Penile pain?: No  Gastrointestinal Nausea?: No Vomiting?: No Indigestion/heartburn?: No Diarrhea?: Yes Constipation?: No  Constitutional Fever: No Night sweats?: No Weight loss?: No Fatigue?: Yes  Skin Skin rash/lesions?: No Itching?: No  Eyes Blurred vision?: Yes Double vision?: No  Ears/Nose/Throat Sore throat?: No Sinus problems?: No  Hematologic/Lymphatic Swollen glands?: No Easy bruising?: Yes  Cardiovascular Leg swelling?: No Chest pain?: No  Respiratory Cough?: No Shortness of breath?: Yes  Endocrine Excessive thirst?: No  Musculoskeletal Back pain?: No Joint pain?: Yes  Neurological Headaches?: No Dizziness?: No  Psychologic Depression?: No Anxiety?: No  Physical Exam: BP 110/64   Pulse 66   Ht 6' (1.829 m)   Wt 198 lb 6.4 oz (90 kg)   BMI 26.91 kg/m   Constitutional:  Alert and oriented, No acute distress. HEENT: [employer] AT, moist mucus membranes.  Trachea midline, no masses. Cardiovascular: No clubbing, cyanosis, or edema. Respiratory: Normal respiratory effort, no increased work of breathing. GI: Abdomen is soft, nontender, nondistended, no abdominal masses GU: No CVA tenderness Lymph: No cervical or inguinal lymphadenopathy. Skin: No rashes,  bruises or suspicious lesions. Neurologic: Grossly intact, no focal deficits, moving all 4 extremities. Psychiatric: Normal mood and affect.   Assessment & Plan:   84 year old male with chronic urinary incontinence.  PVR by bladder scan today was 0 mL.  He was informed that the risks of a chronic indwelling Foley catheter would far outweigh the benefits to manage his urinary incontinence and was not recommended.  I did discuss a condom catheter.  He has not tried any medication for his symptoms and was interested in a trial of Myrbetriq 25 mg.  We were currently out of samples however he will be contacted when available for a trial.     Abbie Sons, MD  [facility] [address] [place], [place] [address] [phone]

## 2018-02-28 ENCOUNTER — Encounter: Payer: Self-pay | Admitting: Urology

## 2018-03-24 ENCOUNTER — Telehealth: Payer: Self-pay | Admitting: Urology

## 2018-03-24 NOTE — Telephone Encounter (Signed)
Pt was given samples of 25 mg Myrbetriq and states he can't tell much difference.  He wanted to know if he should be seen again or if trying 50 mg would be an option.  Please give pt a call [phone].

## 2018-03-28 NOTE — Telephone Encounter (Signed)
Ok to give 50mg ?

## 2018-03-29 NOTE — Telephone Encounter (Signed)
Okay to try 50 mg

## 2018-03-30 NOTE — Telephone Encounter (Signed)
Called patient and he states that he does not wish to try the 50mg  anymore he has made an apt to come in and get fit for a condom cath

## 2018-04-21 ENCOUNTER — Telehealth: Payer: Self-pay

## 2018-04-21 ENCOUNTER — Ambulatory Visit (INDEPENDENT_AMBULATORY_CARE_PROVIDER_SITE_OTHER): Payer: Medicare Other

## 2018-04-21 DIAGNOSIS — R32 Unspecified urinary incontinence: Secondary | ICD-10-CM | POA: Diagnosis not present

## 2018-04-21 NOTE — Telephone Encounter (Signed)
Pt calls and questions what he had done today during his nurse visit today with Judson Roch, pt also questions if there was an order placed for him. Advised pt that he was sized for a condom catheter today, size 25. Advised pt that order would be placed according to his needs. Pt questions if he can get these via the [facility]. Please advise. Thanks

## 2018-04-22 NOTE — Telephone Encounter (Signed)
Left pt mess to call 

## 2018-04-22 NOTE — Telephone Encounter (Signed)
Please call pt, he is very confused.  [phone]

## 2018-04-22 NOTE — Progress Notes (Signed)
Patient present today for a Condom Cath fitting and instruction teaching. Patient has urinary incontinence and would like to stay dry and not have to wear pads. Patient was shown how to properly attach the condom cath to penis and then the extender tubing and leg bag and night bag. After instruction and demonstration , patient was then asked to demonstrate himself. Patient tried unsuccessfully 4 times due to arthritis in hands , after the fifth attempt was able to attach condom cath to penis. Due to patient's difficulty with attaching condom it was recommened that his wife may want to come in and learn so she could help with changing daily. Patient asked for more samples to try again at home. If successful he will call back for an order to be placed with [employer].   Patient's penis was measured at 4 inches (sport length) and 25cm -27cm circumference under meatitis . Per instruction patient should go ahead with smaller size condom, due to patient's difficulty with this size a 27cm should be ordered.

## 2018-04-22 NOTE — Telephone Encounter (Signed)
Spoke to patient and he did not do well with the Condom cath last night. He woke up in a pool of urine and his wife told him he had to get up 3 times at night because she did not want to change the sheets again. He states he will wait about 1 week and see if she will let him try again. He does not want anything ordered at this time until he tries again next week.

## 2018-08-18 ENCOUNTER — Encounter
Admission: RE | Admit: 2018-08-18 | Discharge: 2018-08-18 | Disposition: A | Discharge status: Home / Self Care | Payer: Medicare Other | Source: Ambulatory Visit | Attending: Internal Medicine | Admitting: Internal Medicine

## 2018-09-21 ENCOUNTER — Encounter
Admission: RE | Admit: 2018-09-21 | Discharge: 2018-09-21 | Disposition: A | Discharge status: Home / Self Care | Payer: Medicare Other | Source: Ambulatory Visit | Attending: Internal Medicine | Admitting: Internal Medicine

## 2018-10-19 ENCOUNTER — Encounter
Admission: RE | Admit: 2018-10-19 | Discharge: 2018-10-19 | Disposition: A | Discharge status: Home / Self Care | Payer: Medicare Other | Source: Ambulatory Visit | Attending: Internal Medicine | Admitting: Internal Medicine

## 2018-11-18 ENCOUNTER — Encounter
Admission: RE | Admit: 2018-11-18 | Discharge: 2018-11-18 | Disposition: A | Discharge status: Home / Self Care | Payer: Medicare Other | Source: Ambulatory Visit | Attending: Internal Medicine | Admitting: Internal Medicine

## 2018-12-19 ENCOUNTER — Encounter
Admission: RE | Admit: 2018-12-19 | Discharge: 2018-12-19 | Disposition: A | Discharge status: Home / Self Care | Payer: Medicare Other | Source: Ambulatory Visit | Attending: Internal Medicine | Admitting: Internal Medicine

## 2019-01-18 ENCOUNTER — Encounter
Admission: RE | Admit: 2019-01-18 | Discharge: 2019-01-18 | Disposition: A | Discharge status: Home / Self Care | Payer: Medicare Other | Source: Ambulatory Visit | Attending: Internal Medicine | Admitting: Internal Medicine

## 2019-01-28 ENCOUNTER — Other Ambulatory Visit
Admission: RE | Admit: 2019-01-28 | Discharge: 2019-01-28 | Disposition: A | Discharge status: Home / Self Care | Payer: Medicare Other | Source: Skilled Nursing Facility | Attending: Family Medicine | Admitting: Family Medicine

## 2019-01-28 DIAGNOSIS — R350 Frequency of micturition: Secondary | ICD-10-CM | POA: Insufficient documentation

## 2019-01-28 LAB — URINALYSIS, COMPLETE (UACMP) WITH MICROSCOPIC
Bilirubin Urine: NEGATIVE
Glucose, UA: NEGATIVE mg/dL
Hgb urine dipstick: NEGATIVE
Ketones, ur: NEGATIVE mg/dL
Leukocytes,Ua: NEGATIVE
Nitrite: NEGATIVE
Protein, ur: NEGATIVE mg/dL
Specific Gravity, Urine: 1.012 (ref 1.005–1.030)
WBC, UA: NONE SEEN WBC/hpf (ref 0–5)
pH: 6 (ref 5.0–8.0)

## 2019-01-30 LAB — URINE CULTURE: Culture: >=100000 — AB

## 2019-02-18 ENCOUNTER — Encounter
Admission: RE | Admit: 2019-02-18 | Discharge: 2019-02-18 | Disposition: A | Discharge status: Home / Self Care | Payer: Medicare Other | Source: Ambulatory Visit | Attending: Internal Medicine | Admitting: Internal Medicine

## 2019-03-21 ENCOUNTER — Encounter
Admission: RE | Admit: 2019-03-21 | Discharge: 2019-03-21 | Disposition: A | Discharge status: Home / Self Care | Payer: Medicare Other | Source: Ambulatory Visit | Attending: Internal Medicine | Admitting: Internal Medicine

## 2019-03-29 ENCOUNTER — Other Ambulatory Visit
Admission: RE | Admit: 2019-03-29 | Discharge: 2019-03-29 | Disposition: A | Discharge status: Home / Self Care | Payer: Medicare Other | Source: Ambulatory Visit | Attending: Family Medicine | Admitting: Family Medicine

## 2019-03-29 DIAGNOSIS — R350 Frequency of micturition: Secondary | ICD-10-CM | POA: Insufficient documentation

## 2019-03-29 DIAGNOSIS — R3915 Urgency of urination: Secondary | ICD-10-CM | POA: Diagnosis not present

## 2019-03-29 LAB — URINALYSIS, COMPLETE (UACMP) WITH MICROSCOPIC
Bilirubin Urine: NEGATIVE
Glucose, UA: NEGATIVE mg/dL
Hgb urine dipstick: NEGATIVE
Ketones, ur: NEGATIVE mg/dL
Leukocytes,Ua: NEGATIVE
Nitrite: NEGATIVE
Protein, ur: 30 mg/dL — AB
Specific Gravity, Urine: 1.018 (ref 1.005–1.030)
pH: 5 (ref 5.0–8.0)

## 2019-03-31 LAB — URINE CULTURE: Culture: >=100000 — AB

## 2019-04-06 ENCOUNTER — Other Ambulatory Visit
Admission: RE | Admit: 2019-04-06 | Discharge: 2019-04-06 | Disposition: A | Discharge status: Home / Self Care | Payer: Medicare Other | Source: Skilled Nursing Facility | Attending: Internal Medicine | Admitting: Internal Medicine

## 2019-04-06 DIAGNOSIS — C679 Malignant neoplasm of bladder, unspecified: Secondary | ICD-10-CM | POA: Insufficient documentation

## 2019-04-06 DIAGNOSIS — Z0389 Encounter for observation for other suspected diseases and conditions ruled out: Secondary | ICD-10-CM | POA: Diagnosis present

## 2019-04-06 LAB — URINALYSIS, ROUTINE W REFLEX MICROSCOPIC
Bilirubin Urine: NEGATIVE
Glucose, UA: NEGATIVE mg/dL
Hgb urine dipstick: NEGATIVE
Ketones, ur: NEGATIVE mg/dL
Leukocytes,Ua: NEGATIVE
Nitrite: NEGATIVE
Protein, ur: NEGATIVE mg/dL
Specific Gravity, Urine: 1.017 (ref 1.005–1.030)
pH: 5 (ref 5.0–8.0)

## 2019-04-08 LAB — URINE CULTURE: Culture: <10000 — AB

## 2019-04-21 ENCOUNTER — Encounter
Admission: RE | Admit: 2019-04-21 | Discharge: 2019-04-21 | Disposition: A | Discharge status: Home / Self Care | Payer: Medicare Other | Source: Ambulatory Visit | Attending: Internal Medicine | Admitting: Internal Medicine

## 2019-04-21 ENCOUNTER — Inpatient Hospital Stay: Admission: RE | Admit: 2019-04-21 | Payer: Medicare Other | Source: Ambulatory Visit | Admitting: Internal Medicine

## 2019-05-29 ENCOUNTER — Encounter
Admission: RE | Admit: 2019-05-29 | Discharge: 2019-05-29 | Disposition: A | Discharge status: Home / Self Care | Payer: Medicare Other | Source: Ambulatory Visit | Attending: Internal Medicine | Admitting: Internal Medicine

## 2019-09-07 ENCOUNTER — Encounter
Admission: RE | Admit: 2019-09-07 | Discharge: 2019-09-07 | Disposition: A | Discharge status: Home / Self Care | Payer: Medicare Other | Source: Ambulatory Visit | Attending: Internal Medicine | Admitting: Internal Medicine

## 2019-11-20 ENCOUNTER — Encounter
Admission: RE | Admit: 2019-11-20 | Discharge: 2019-11-20 | Disposition: A | Discharge status: Home / Self Care | Payer: Medicare Other | Source: Ambulatory Visit | Attending: Internal Medicine | Admitting: Internal Medicine

## 2020-01-10 ENCOUNTER — Other Ambulatory Visit: Payer: Self-pay | Admitting: Internal Medicine

## 2020-01-10 ENCOUNTER — Ambulatory Visit
Admission: RE | Admit: 2020-01-10 | Discharge: 2020-01-10 | Disposition: A | Discharge status: Home / Self Care | Payer: Medicare Other | Source: Ambulatory Visit | Attending: Internal Medicine | Admitting: Internal Medicine

## 2020-01-10 ENCOUNTER — Other Ambulatory Visit: Payer: Self-pay

## 2020-01-10 DIAGNOSIS — R600 Localized edema: Secondary | ICD-10-CM | POA: Insufficient documentation

## 2020-01-22 ENCOUNTER — Encounter: Payer: Self-pay | Admitting: Emergency Medicine

## 2020-01-22 ENCOUNTER — Other Ambulatory Visit: Payer: Self-pay

## 2020-01-22 ENCOUNTER — Emergency Department: Payer: Medicare Other

## 2020-01-22 ENCOUNTER — Emergency Department
Admission: EM | Admit: 2020-01-22 | Discharge: 2020-01-22 | Disposition: A | Discharge status: Home / Self Care | Payer: Medicare Other | Attending: Emergency Medicine | Admitting: Emergency Medicine

## 2020-01-22 DIAGNOSIS — Z8546 Personal history of malignant neoplasm of prostate: Secondary | ICD-10-CM | POA: Insufficient documentation

## 2020-01-22 DIAGNOSIS — Z87891 Personal history of nicotine dependence: Secondary | ICD-10-CM | POA: Diagnosis not present

## 2020-01-22 DIAGNOSIS — Z79899 Other long term (current) drug therapy: Secondary | ICD-10-CM | POA: Diagnosis not present

## 2020-01-22 DIAGNOSIS — Z96659 Presence of unspecified artificial knee joint: Secondary | ICD-10-CM | POA: Insufficient documentation

## 2020-01-22 DIAGNOSIS — R102 Pelvic and perineal pain: Secondary | ICD-10-CM | POA: Diagnosis not present

## 2020-01-22 DIAGNOSIS — I10 Essential (primary) hypertension: Secondary | ICD-10-CM | POA: Diagnosis not present

## 2020-01-22 DIAGNOSIS — N3289 Other specified disorders of bladder: Secondary | ICD-10-CM | POA: Diagnosis not present

## 2020-01-22 DIAGNOSIS — R319 Hematuria, unspecified: Secondary | ICD-10-CM | POA: Diagnosis present

## 2020-01-22 DIAGNOSIS — N3090 Cystitis, unspecified without hematuria: Secondary | ICD-10-CM | POA: Insufficient documentation

## 2020-01-22 DIAGNOSIS — Z20822 Contact with and (suspected) exposure to covid-19: Secondary | ICD-10-CM | POA: Insufficient documentation

## 2020-01-22 LAB — CBC WITH DIFFERENTIAL/PLATELET
Abs Immature Granulocytes: 0.03 10*3/uL (ref 0.00–0.07)
Basophils Absolute: 0 10*3/uL (ref 0.0–0.1)
Basophils Relative: 1 %
Eosinophils Absolute: 0.1 10*3/uL (ref 0.0–0.5)
Eosinophils Relative: 1 %
HCT: 34.1 % — ABNORMAL LOW (ref 39.0–52.0)
Hemoglobin: 11.8 g/dL — ABNORMAL LOW (ref 13.0–17.0)
Immature Granulocytes: 1 %
Lymphocytes Relative: 29 %
Lymphs Abs: 1.6 10*3/uL (ref 0.7–4.0)
MCH: 32.2 pg (ref 26.0–34.0)
MCHC: 34.6 g/dL (ref 30.0–36.0)
MCV: 92.9 fL (ref 80.0–100.0)
Monocytes Absolute: 0.8 10*3/uL (ref 0.1–1.0)
Monocytes Relative: 15 %
Neutro Abs: 2.9 10*3/uL (ref 1.7–7.7)
Neutrophils Relative %: 53 %
Platelets: 231 10*3/uL (ref 150–400)
RBC: 3.67 MIL/uL — ABNORMAL LOW (ref 4.22–5.81)
RDW: 14.6 % (ref 11.5–15.5)
WBC: 5.5 10*3/uL (ref 4.0–10.5)
nRBC: 0 % (ref 0.0–0.2)

## 2020-01-22 LAB — URINALYSIS, COMPLETE (UACMP) WITH MICROSCOPIC
RBC / HPF: >50 RBC/hpf — ABNORMAL HIGH (ref 0–5)
Specific Gravity, Urine: 1.021 (ref 1.005–1.030)
Squamous Epithelial / HPF: NONE SEEN (ref 0–5)
WBC, UA: >50 WBC/hpf — ABNORMAL HIGH (ref 0–5)

## 2020-01-22 LAB — BASIC METABOLIC PANEL
Anion gap: 9 (ref 5–15)
BUN: 46 mg/dL — ABNORMAL HIGH (ref 8–23)
CO2: 23 mmol/L (ref 22–32)
Calcium: 9 mg/dL (ref 8.9–10.3)
Chloride: 107 mmol/L (ref 98–111)
Creatinine, Ser: 1.42 mg/dL — ABNORMAL HIGH (ref 0.61–1.24)
GFR calc Af Amer: 49 mL/min — ABNORMAL LOW (ref >60)
GFR calc non Af Amer: 42 mL/min — ABNORMAL LOW (ref >60)
Glucose, Bld: 124 mg/dL — ABNORMAL HIGH (ref 70–99)
Potassium: 4.1 mmol/L (ref 3.5–5.1)
Sodium: 139 mmol/L (ref 135–145)

## 2020-01-22 LAB — SARS CORONAVIRUS 2 BY RT PCR (HOSPITAL ORDER, PERFORMED IN ~~LOC~~ HOSPITAL LAB): SARS Coronavirus 2: NEGATIVE

## 2020-01-22 MED ORDER — SODIUM CHLORIDE 0.9 % IV SOLN
1.0000 g | Freq: Once | INTRAVENOUS | Status: AC
  Administered 2020-01-22: 1 g via INTRAVENOUS
  Filled 2020-01-22: qty 10

## 2020-01-22 MED ORDER — ONDANSETRON 4 MG PO TBDP
4.0000 mg | ORAL_TABLET | Freq: Three times a day (TID) | ORAL | 0 refills | Status: DC | PRN
Start: 2020-01-22 — End: 2022-09-22

## 2020-01-22 MED ORDER — AMOXICILLIN-POT CLAVULANATE 875-125 MG PO TABS
1.0000 | ORAL_TABLET | Freq: Once | ORAL | Status: AC
  Administered 2020-01-22: 1 via ORAL
  Filled 2020-01-22: qty 1

## 2020-01-22 MED ORDER — AMOXICILLIN-POT CLAVULANATE 875-125 MG PO TABS
1.0000 | ORAL_TABLET | Freq: Two times a day (BID) | ORAL | 0 refills | Status: DC
Start: 2020-01-22 — End: 2020-01-30

## 2020-01-22 MED ORDER — IOHEXOL 300 MG/ML  SOLN
75.0000 mL | Freq: Once | INTRAMUSCULAR | Status: AC | PRN
  Administered 2020-01-22: 75 mL via INTRAVENOUS

## 2020-01-22 MED ORDER — METRONIDAZOLE IN NACL 5-0.79 MG/ML-% IV SOLN
500.0000 mg | Freq: Once | INTRAVENOUS | Status: AC
  Administered 2020-01-22: 500 mg via INTRAVENOUS
  Filled 2020-01-22: qty 100

## 2020-01-22 NOTE — ED Provider Notes (Signed)
[facility] Emergency Department Provider Note  ____________________________________________  Time seen: Approximately 12:31 PM  I have reviewed the triage vital signs and the nursing notes.   HISTORY  Chief Complaint Hematuria     HPI Geoffrey Barber is a 86 y.o. male with a history of hypertension, pulmonary embolism, prostate cancer status post radiation who complains of hematuria since last night with some pelvic pressure pain and urinary urgency.  Denies fevers chills or back pain.  No vomiting or diarrhea.   Pain is moderate intensity, nonradiating, no aggravating or alleviating factors.     Past Medical History:  Diagnosis Date  . Anemia   . Arthritis   . BPH without obstruction/lower urinary tract symptoms   . Difficulty walking   . GERD (gastroesophageal reflux disease)   . Hypertension   . Irradiation cystitis with hematuria   . Muscle weakness   . PE (pulmonary embolism)   . Pneumonia   . Prostate cancer ([place])   . UTI (lower urinary tract infection)      Patient Active Problem List   Diagnosis Date Noted  . DJD (degenerative joint disease) 06/04/2016  . Acute pulmonary embolism ([place]) 05/11/2016  . Bladder cancer ([place]) 05/11/2016  . Accelerated hypertension 05/11/2016     Past Surgical History:  Procedure Laterality Date  . PERIPHERAL VASCULAR CATHETERIZATION N/A 12/27/2015   Procedure: IVC Filter Insertion;  Surgeon: [name], MD;  Location: [place] [facility];  Service: Cardiovascular;  Laterality: N/A;  . PERIPHERAL VASCULAR CATHETERIZATION N/A 05/19/2016   Procedure: IVC Filter Removal;  Surgeon: [name], MD;  Location: [facility];  Service: Cardiovascular;  Laterality: N/A;  . Rectal Cyst Removal  1954  . TOTAL KNEE ARTHROPLASTY  2006, 2009     Prior to Admission medications   Medication Sig Start Date End Date Taking? Authorizing Provider  amoxicillin-clavulanate (AUGMENTIN) 875-125 MG  tablet Take 1 tablet by mouth 2 (two) times daily. 01/22/20   Carrie Mew, MD  calcium carbonate (CALCIUM 600) 600 MG TABS tablet Take 600 mg by mouth 2 (two) times daily with a meal.    [provider]  carboxymethylcellulose (REFRESH PLUS) 0.5 % SOLN 1 drop 3 (three) times daily as needed.    [provider]  clopidogrel (PLAVIX) 75 MG tablet Take 75 mg by mouth daily.    [provider]  dapsone 25 MG tablet Take 25 mg by mouth daily.    [provider]  ferrous sulfate 325 (65 FE) MG tablet Take 325 mg by mouth daily with breakfast.    [provider]  hydrochlorothiazide (HYDRODIURIL) 25 MG tablet Take 25 mg by mouth daily.    [provider]  Incontinence Supply Disposable (POISE MAXIMUM ABSORBENCY) PADS by Does not apply route.    [provider]  lisinopril (PRINIVIL,ZESTRIL) 20 MG tablet Take 20 mg by mouth daily.    [provider]  Multiple Vitamins-Minerals (PRESERVISION AREDS 2+MULTI VIT PO) Take by mouth.    [provider]  omeprazole (PRILOSEC) 20 MG capsule Take 20 mg by mouth daily.    [provider]  ondansetron (ZOFRAN ODT) 4 MG disintegrating tablet Take 1 tablet (4 mg total) by mouth every 8 (eight) hours as needed for nausea or vomiting. 01/22/20   Carrie Mew, MD  potassium chloride SA (K-DUR,KLOR-CON) 20 MEQ tablet Take 20 mEq by mouth 2 (two) times daily.    [provider]  psyllium (METAMUCIL) 58.6 %  powder Take 1 packet by mouth daily.    [provider]  tamsulosin (FLOMAX) 0.4 MG CAPS capsule Take 0.4 mg by mouth.    [provider]  triamcinolone cream (KENALOG) 0.1 % Apply 1 application topically 2 (two) times daily.    [provider]     Allergies Furosemide   Family History  Problem Relation Age of Onset  . Prostate cancer Neg Hx   . Bladder Cancer Neg Hx   . Kidney cancer Neg Hx     Social History Social History    Tobacco Use  . Smoking status: Former Research scientist (life sciences)  . Smokeless tobacco: Never Used  Substance Use Topics  . Alcohol use: Yes    Alcohol/week: 6.0 standard drinks    Types: 6 Glasses of wine per week  . Drug use: Not on file    Review of Systems  Constitutional:   No fever or chills.  ENT:   No sore throat. No rhinorrhea. Cardiovascular:   No chest pain or syncope. Respiratory:   No dyspnea or cough. Gastrointestinal:   Positive as above for low abdominal pain without vomiting and diarrhea.  Musculoskeletal:   Negative for focal pain or swelling All other systems reviewed and are negative except as documented above in ROS and HPI.  ____________________________________________   PHYSICAL EXAM:  VITAL SIGNS: ED Triage Vitals  Enc Vitals Group     BP 01/22/20 1015 (!) 182/80     Pulse Rate 01/22/20 1012 65     Resp 01/22/20 1012 (!) 25     Temp 01/22/20 1012 97.6 F (36.4 C)     Temp Source 01/22/20 1012 Oral     SpO2 01/22/20 1012 96 %     Weight 01/22/20 1012 220 lb (99.8 kg)     Height --      Head Circumference --      Peak Flow --      Pain Score 01/22/20 1012 0     Pain Loc --      Pain Edu? --      Excl. in [place]? --     Vital signs reviewed, nursing assessments reviewed.   Constitutional:   Alert and oriented. Non-toxic appearance. Eyes:   Conjunctivae are normal. EOMI. PERRL. ENT      Head:   Normocephalic and atraumatic.      Nose:   Wearing a mask.      Mouth/Throat:   Wearing a mask.      Neck:   No meningismus. Full ROM. Hematological/Lymphatic/Immunilogical:   No cervical lymphadenopathy. Cardiovascular:   RRR. Symmetric bilateral radial and DP pulses.  No murmurs. Cap refill less than 2 seconds. Respiratory:   Tachypnea.  Normal work of breathing.  Breath sounds clear bilaterally without crackles or wheezes.  Symmetric air entry. Gastrointestinal:   Soft with mild suprapubic tenderness. Non distended. There is no CVA tenderness.  No rebound, rigidity,  or guarding. Musculoskeletal:   Normal range of motion in all extremities. No joint effusions.  No lower extremity tenderness.  No edema. Neurologic:   Normal speech and language.  Motor grossly intact. No acute focal neurologic deficits are appreciated.  Skin:    Skin is warm, dry and intact. No rash noted.  No petechiae, purpura, or bullae.  ____________________________________________    LABS (pertinent positives/negatives) (all labs ordered are listed, but only abnormal results are displayed) Labs Reviewed  BASIC METABOLIC PANEL - Abnormal; Notable for the following components:  Result Value   Glucose, Bld 124 (*)    BUN 46 (*)    Creatinine, Ser 1.42 (*)    GFR calc non Af Amer 42 (*)    GFR calc Af Amer 49 (*)    All other components within normal limits  CBC WITH DIFFERENTIAL/PLATELET - Abnormal; Notable for the following components:   RBC 3.67 (*)    Hemoglobin 11.8 (*)    HCT 34.1 (*)    All other components within normal limits  URINALYSIS, COMPLETE (UACMP) WITH MICROSCOPIC - Abnormal; Notable for the following components:   Color, Urine RED (*)    APPearance TURBID (*)    Glucose, UA   (*)    Value: TEST NOT REPORTED DUE TO COLOR INTERFERENCE OF URINE PIGMENT   Hgb urine dipstick   (*)    Value: TEST NOT REPORTED DUE TO COLOR INTERFERENCE OF URINE PIGMENT   Bilirubin Urine   (*)    Value: TEST NOT REPORTED DUE TO COLOR INTERFERENCE OF URINE PIGMENT   Ketones, ur   (*)    Value: TEST NOT REPORTED DUE TO COLOR INTERFERENCE OF URINE PIGMENT   Protein, ur   (*)    Value: TEST NOT REPORTED DUE TO COLOR INTERFERENCE OF URINE PIGMENT   Nitrite   (*)    Value: TEST NOT REPORTED DUE TO COLOR INTERFERENCE OF URINE PIGMENT   Leukocytes,Ua   (*)    Value: TEST NOT REPORTED DUE TO COLOR INTERFERENCE OF URINE PIGMENT   RBC / HPF >50 (*)    WBC, UA >50 (*)    Bacteria, UA MANY (*)    All other components within normal limits  SARS CORONAVIRUS 2 BY RT PCR (HOSPITAL  ORDER, [facility])  URINE CULTURE   ____________________________________________   EKG  Interpreted by me Sinus rhythm rate of 72, normal axis and intervals.  Normal QRS ST segments and T waves.  2 PVCs on the strip.  ____________________________________________    RADIOLOGY  CT ABDOMEN PELVIS W CONTRAST  Result Date: 01/22/2020 CLINICAL DATA:  Abdominal abscess/infection suspected. Diverticulitis suspected. Abdominal pain. Free air on chest x-ray. Hematuria since last night. EXAM: CT ABDOMEN AND PELVIS WITH CONTRAST TECHNIQUE: Multidetector CT imaging of the abdomen and pelvis was performed using the standard protocol following bolus administration of intravenous contrast. CONTRAST:  34mL OMNIPAQUE IOHEXOL 300 MG/ML  SOLN COMPARISON:  Chest x-ray on 01/22/2020, CT of the abdomen and pelvis on 12/11/2015 FINDINGS: Lower chest: There is streaky subsegmental atelectasis at the lung bases. Coronary artery calcifications are present. Heart is mildly enlarged. Hepatobiliary: No free intraperitoneal air. The hepatic flexure of the colon is interposed between the liver and the hemidiaphragm. Gallbladder is present. Small liver cysts are 5 millimeters or smaller. No suspicious liver lesion. Pancreas: Unremarkable. No pancreatic ductal dilatation or surrounding inflammatory changes. Spleen: Normal in size without focal abnormality. Adrenals/Urinary Tract: Adrenal glands are normal in appearance. RIGHT renal cyst is 7.2 x 6.0 centimeters. No intrarenal calculi. No hydronephrosis. Ureters are mildly dilated bilaterally. There is marked thickening of the urinary bladder wall, a new finding since prior study. Along the posterior aspect of the urinary bladder wall, the wall is thickened and irregular, measuring up to 2.2 centimeters. Stomach/Bowel: Small hiatal hernia. Stomach is normal in appearance otherwise. Small bowel loops are normal in caliber and wall thickness. There is  moderate stool burden throughout normal appearing loops of colon. The appendix is well seen and has a normal  appearance. Vascular/Lymphatic: There is atherosclerotic calcification of the abdominal aorta not associated with aneurysm. No retroperitoneal or mesenteric adenopathy. Reproductive: Prostate is unremarkable. Other: No ascites.  Anterior abdominal wall is unremarkable. Musculoskeletal: Chronic deformity of the superior and inferior pubic rami bilaterally. Moderate degenerative changes throughout the visualized thoracic and lumbar spine. Vacuum disc phenomenon at multiple levels. IMPRESSION: 1. Marked thickening of the urinary bladder wall, a new finding since prior study. Along the posterior aspect of the urinary bladder wall, the wall is thickened and irregular, measuring up to 2.2 centimeters. Findings are suspicious for malignancy. Recommend further evaluation with cystoscopy. Is 2. Mild bilateral hydroureter, likely related to the urinary bladder process. 3. RIGHT renal cyst. 4. Small hiatal hernia. 5. Coronary artery disease. 6. Chronic deformity of the superior and inferior pubic rami bilaterally. 7. Moderate stool burden. 8. Degenerative changes in the visualized thoracic and lumbar spine. 9. Aortic Atherosclerosis (ICD10-I70.0). Electronically Signed   By: Nolon Nations M.D.   On: 01/22/2020 13:25   DG Chest Portable 1 View  Result Date: 01/22/2020 CLINICAL DATA:  Shortness of breath EXAM: PORTABLE CHEST 1 VIEW COMPARISON:  January 01, 2016 FINDINGS: The mediastinal contour is stable. Heart size is enlarged. Mild opacity of left lung base is identified probably due to atelectasis. The right lung is clear. Air is identified beneath the right hemidiaphragm. Degenerative joint changes of bilateral shoulders are identified. IMPRESSION: Probable atelectasis of left lung base. Air beneath the right hemidiaphragm suspicious for free air. Further evaluation with a CT of abdomen and pelvis is recommended.  These results were called by telephone at the time of interpretation on 01/22/2020 at 11:34 am to provider Deborh Pense , who verbally acknowledged these results. Electronically Signed   By: Abelardo Diesel M.D.   On: 01/22/2020 11:35    ____________________________________________   PROCEDURES Procedures  ____________________________________________  DIFFERENTIAL DIAGNOSIS   UTI, hematuria, kidney stone, pneumonia, pleural effusion, diverticulitis  CLINICAL IMPRESSION / ASSESSMENT AND PLAN / ED COURSE  Medications ordered in the ED: Medications  amoxicillin-clavulanate (AUGMENTIN) 875-125 MG per tablet 1 tablet (has no administration in time range)  cefTRIAXone (ROCEPHIN) 1 g in sodium chloride 0.9 % 100 mL IVPB (0 g Intravenous Stopped 01/22/20 1302)  metroNIDAZOLE (FLAGYL) IVPB 500 mg (500 mg Intravenous New Bag/Given 01/22/20 1202)  cefTRIAXone (ROCEPHIN) 1 g in sodium chloride 0.9 % 100 mL IVPB (1 g Intravenous New Bag/Given 01/22/20 1302)  iohexol (OMNIPAQUE) 300 MG/ML solution 75 mL (75 mLs Intravenous Contrast Given 01/22/20 1240)    Pertinent labs & imaging results that were available during my care of the patient were reviewed by me and considered in my medical decision making (see chart for details).  IVO MOGA was evaluated in Emergency Department on 01/22/2020 for the symptoms described in the history of present illness. He was evaluated in the context of the global COVID-19 pandemic, which necessitated consideration that the patient might be at risk for infection with the SARS-CoV-2 virus that causes COVID-19. Institutional protocols and algorithms that pertain to the evaluation of patients at risk for COVID-19 are in a state of rapid change based on information released by regulatory bodies including the CDC and federal and state organizations. These policies and algorithms were followed during the patient's care in the ED.     Clinical Course as of Jan 22 1352  Mon Jan 22, 2020  1019 Patient presents with hematuria, frequency, suprapubic tenderness, concern for UTI or hematuria with bladder obstruction.  Will check  labs, bladder scan.   [PS]  1140 Chest x-ray image interpreted by me and compared to previous chest x-ray.  Today shows free air under the right hemidiaphragm.  Lung spaces are clear.  Discussed with radiology who confirms the finding of free air.  Informed the patient who agrees with CT and possible surgical management.   [PS]  5102 CT shows no evidence of perforation or significant intra-abdominal process apart from cystitis and known GU malignancy.   [PS]    Clinical Course User Index [PS] Carrie Mew, MD    ----------------------------------------- 1:54 PM on 01/22/2020 -----------------------------------------  Results discussed with patient and spouse at bedside.  He is nontoxic, tolerating oral intake.  Reviewed old microbiology data which showed combination of E. coli and Enterococcus infections, Augmentin sensitive .  I will prescribe Augmentin, urine cultures been sent, recommend close follow-up with PCP and urology.  Offered hospitalization which they declined at this time.  He is being discharged back to assisted living facility.   ____________________________________________   FINAL CLINICAL IMPRESSION(S) / ED DIAGNOSES    Final diagnoses:  Cystitis  Bladder mass     ED Discharge Orders         Ordered    amoxicillin-clavulanate (AUGMENTIN) 875-125 MG tablet  2 times daily     Discontinue  Reprint     01/22/20 1352    ondansetron (ZOFRAN ODT) 4 MG disintegrating tablet  Every 8 hours PRN     Discontinue  Reprint     01/22/20 1352          Portions of this note were generated with dragon dictation software. Dictation errors may occur despite best attempts at proofreading.   Carrie Mew, MD 01/22/20 [phone]

## 2020-01-22 NOTE — ED Triage Notes (Signed)
Pt c/o hematuria since last night. Hx of same.  No known fever.  Some pain in bladder. Disoriented to year.  From [facility] [place]

## 2020-01-22 NOTE — Discharge Instructions (Signed)
Take Augmentin 2 times a day to treat the bladder infection.  Your CT scan today showed a 2 cm mass on the posterior wall of the bladder.  You should follow-up with urology for further evaluation of this finding.

## 2020-01-22 NOTE — ED Notes (Signed)
Patient relations at bedside to update on patient wait time

## 2020-01-22 NOTE — ED Notes (Signed)
E-signature not working at this time. Pt and wife verbalized understanding of D/C instructions, prescriptions and follow up care with no further questions at this time. Pt in NAD and in ems for transport back to facility

## 2020-01-22 NOTE — ED Notes (Addendum)
Xray at bedside, family given cup water

## 2020-01-22 NOTE — ED Notes (Signed)
[facility]  CALLED  FOR  TRANSPORT 

## 2020-01-25 LAB — URINE CULTURE: Culture: >=100000 — AB

## 2020-01-26 ENCOUNTER — Other Ambulatory Visit: Payer: Self-pay

## 2020-01-26 ENCOUNTER — Emergency Department: Payer: Medicare Other

## 2020-01-26 ENCOUNTER — Inpatient Hospital Stay
Admission: EM | Admit: 2020-01-26 | Discharge: 2020-01-30 | DRG: 560 | Disposition: A | Discharge status: Skilled Nursing Facility | Payer: Medicare Other | Source: Skilled Nursing Facility | Attending: Internal Medicine | Admitting: Internal Medicine

## 2020-01-26 ENCOUNTER — Encounter
Admission: RE | Admit: 2020-01-26 | Discharge: 2020-01-26 | Disposition: A | Discharge status: Home / Self Care | Payer: Medicare Other | Source: Ambulatory Visit | Attending: Internal Medicine | Admitting: Internal Medicine

## 2020-01-26 ENCOUNTER — Observation Stay
Admit: 2020-01-26 | Discharge: 2020-01-26 | Disposition: A | Discharge status: Home / Self Care | Payer: Medicare Other | Attending: Internal Medicine | Admitting: Internal Medicine

## 2020-01-26 DIAGNOSIS — I10 Essential (primary) hypertension: Secondary | ICD-10-CM | POA: Diagnosis not present

## 2020-01-26 DIAGNOSIS — W19XXXD Unspecified fall, subsequent encounter: Secondary | ICD-10-CM | POA: Diagnosis present

## 2020-01-26 DIAGNOSIS — K219 Gastro-esophageal reflux disease without esophagitis: Secondary | ICD-10-CM | POA: Diagnosis present

## 2020-01-26 DIAGNOSIS — N400 Enlarged prostate without lower urinary tract symptoms: Secondary | ICD-10-CM | POA: Diagnosis not present

## 2020-01-26 DIAGNOSIS — R262 Difficulty in walking, not elsewhere classified: Secondary | ICD-10-CM | POA: Diagnosis present

## 2020-01-26 DIAGNOSIS — M25551 Pain in right hip: Secondary | ICD-10-CM | POA: Diagnosis not present

## 2020-01-26 DIAGNOSIS — S32501G Unspecified fracture of right pubis, subsequent encounter for fracture with delayed healing: Secondary | ICD-10-CM | POA: Diagnosis not present

## 2020-01-26 DIAGNOSIS — N183 Chronic kidney disease, stage 3 unspecified: Secondary | ICD-10-CM | POA: Diagnosis present

## 2020-01-26 DIAGNOSIS — I459 Conduction disorder, unspecified: Secondary | ICD-10-CM | POA: Diagnosis present

## 2020-01-26 DIAGNOSIS — N136 Pyonephrosis: Secondary | ICD-10-CM | POA: Diagnosis present

## 2020-01-26 DIAGNOSIS — Z79899 Other long term (current) drug therapy: Secondary | ICD-10-CM

## 2020-01-26 DIAGNOSIS — W19XXXA Unspecified fall, initial encounter: Secondary | ICD-10-CM

## 2020-01-26 DIAGNOSIS — Z888 Allergy status to other drugs, medicaments and biological substances status: Secondary | ICD-10-CM

## 2020-01-26 DIAGNOSIS — S32502G Unspecified fracture of left pubis, subsequent encounter for fracture with delayed healing: Secondary | ICD-10-CM

## 2020-01-26 DIAGNOSIS — B9620 Unspecified Escherichia coli [E. coli] as the cause of diseases classified elsewhere: Secondary | ICD-10-CM | POA: Diagnosis present

## 2020-01-26 DIAGNOSIS — I739 Peripheral vascular disease, unspecified: Secondary | ICD-10-CM | POA: Diagnosis present

## 2020-01-26 DIAGNOSIS — N390 Urinary tract infection, site not specified: Secondary | ICD-10-CM | POA: Diagnosis not present

## 2020-01-26 DIAGNOSIS — I4891 Unspecified atrial fibrillation: Secondary | ICD-10-CM | POA: Diagnosis present

## 2020-01-26 DIAGNOSIS — M1990 Unspecified osteoarthritis, unspecified site: Secondary | ICD-10-CM | POA: Diagnosis present

## 2020-01-26 DIAGNOSIS — Z86711 Personal history of pulmonary embolism: Secondary | ICD-10-CM

## 2020-01-26 DIAGNOSIS — Z8546 Personal history of malignant neoplasm of prostate: Secondary | ICD-10-CM

## 2020-01-26 DIAGNOSIS — S329XXG Fracture of unspecified parts of lumbosacral spine and pelvis, subsequent encounter for fracture with delayed healing: Secondary | ICD-10-CM

## 2020-01-26 DIAGNOSIS — Z87891 Personal history of nicotine dependence: Secondary | ICD-10-CM

## 2020-01-26 DIAGNOSIS — R52 Pain, unspecified: Secondary | ICD-10-CM

## 2020-01-26 DIAGNOSIS — Z96659 Presence of unspecified artificial knee joint: Secondary | ICD-10-CM | POA: Diagnosis present

## 2020-01-26 DIAGNOSIS — Z20822 Contact with and (suspected) exposure to covid-19: Secondary | ICD-10-CM | POA: Diagnosis present

## 2020-01-26 DIAGNOSIS — Z7902 Long term (current) use of antithrombotics/antiplatelets: Secondary | ICD-10-CM

## 2020-01-26 DIAGNOSIS — I129 Hypertensive chronic kidney disease with stage 1 through stage 4 chronic kidney disease, or unspecified chronic kidney disease: Secondary | ICD-10-CM | POA: Diagnosis present

## 2020-01-26 LAB — COMPREHENSIVE METABOLIC PANEL
ALT: 20 U/L (ref 0–44)
AST: 29 U/L (ref 15–41)
Albumin: 4.1 g/dL (ref 3.5–5.0)
Alkaline Phosphatase: 46 U/L (ref 38–126)
Anion gap: 9 (ref 5–15)
BUN: 58 mg/dL — ABNORMAL HIGH (ref 8–23)
CO2: 24 mmol/L (ref 22–32)
Calcium: 9.4 mg/dL (ref 8.9–10.3)
Chloride: 108 mmol/L (ref 98–111)
Creatinine, Ser: 1.5 mg/dL — ABNORMAL HIGH (ref 0.61–1.24)
GFR calc Af Amer: 46 mL/min — ABNORMAL LOW (ref >60)
GFR calc non Af Amer: 40 mL/min — ABNORMAL LOW (ref >60)
Glucose, Bld: 126 mg/dL — ABNORMAL HIGH (ref 70–99)
Potassium: 3.9 mmol/L (ref 3.5–5.1)
Sodium: 141 mmol/L (ref 135–145)
Total Bilirubin: 0.9 mg/dL (ref 0.3–1.2)
Total Protein: 6.9 g/dL (ref 6.5–8.1)

## 2020-01-26 LAB — CBC WITH DIFFERENTIAL/PLATELET
Abs Immature Granulocytes: 0.06 10*3/uL (ref 0.00–0.07)
Basophils Absolute: 0 10*3/uL (ref 0.0–0.1)
Basophils Relative: 0 %
Eosinophils Absolute: 0.1 10*3/uL (ref 0.0–0.5)
Eosinophils Relative: 1 %
HCT: 31.3 % — ABNORMAL LOW (ref 39.0–52.0)
Hemoglobin: 10.6 g/dL — ABNORMAL LOW (ref 13.0–17.0)
Immature Granulocytes: 1 %
Lymphocytes Relative: 18 %
Lymphs Abs: 1.5 10*3/uL (ref 0.7–4.0)
MCH: 32.2 pg (ref 26.0–34.0)
MCHC: 33.9 g/dL (ref 30.0–36.0)
MCV: 95.1 fL (ref 80.0–100.0)
Monocytes Absolute: 1 10*3/uL (ref 0.1–1.0)
Monocytes Relative: 12 %
Neutro Abs: 5.7 10*3/uL (ref 1.7–7.7)
Neutrophils Relative %: 68 %
Platelets: 229 10*3/uL (ref 150–400)
RBC: 3.29 MIL/uL — ABNORMAL LOW (ref 4.22–5.81)
RDW: 14.6 % (ref 11.5–15.5)
WBC: 8.3 10*3/uL (ref 4.0–10.5)
nRBC: 0 % (ref 0.0–0.2)

## 2020-01-26 LAB — ECHOCARDIOGRAM COMPLETE
Height: 72 in
Weight: 3519.99 oz

## 2020-01-26 LAB — TSH: TSH: 5.92 u[IU]/mL — ABNORMAL HIGH (ref 0.350–4.500)

## 2020-01-26 LAB — SARS CORONAVIRUS 2 BY RT PCR (HOSPITAL ORDER, PERFORMED IN ~~LOC~~ HOSPITAL LAB): SARS Coronavirus 2: NEGATIVE

## 2020-01-26 MED ORDER — POLYETHYLENE GLYCOL 3350 17 G PO PACK
17.0000 g | PACK | Freq: Every day | ORAL | Status: DC | PRN
  Administered 2020-01-27: 17 g via ORAL
  Filled 2020-01-26: qty 1

## 2020-01-26 MED ORDER — SODIUM CHLORIDE 0.9 % IV SOLN
1.0000 g | Freq: Once | INTRAVENOUS | Status: AC
  Administered 2020-01-26: 1 g via INTRAVENOUS
  Filled 2020-01-26: qty 10

## 2020-01-26 MED ORDER — SODIUM CHLORIDE 0.9 % IV SOLN
1.0000 g | INTRAVENOUS | Status: DC
  Administered 2020-01-27 – 2020-01-30 (×4): 1 g via INTRAVENOUS
  Filled 2020-01-26 (×4): qty 1

## 2020-01-26 MED ORDER — ACETAMINOPHEN 325 MG PO TABS
650.0000 mg | ORAL_TABLET | Freq: Once | ORAL | Status: AC
  Administered 2020-01-26: 650 mg via ORAL
  Filled 2020-01-26: qty 2

## 2020-01-26 MED ORDER — DAPSONE 25 MG PO TABS
25.0000 mg | ORAL_TABLET | ORAL | Status: DC
  Filled 2020-01-26 (×4): qty 1

## 2020-01-26 MED ORDER — CLOPIDOGREL BISULFATE 75 MG PO TABS
75.0000 mg | ORAL_TABLET | Freq: Every day | ORAL | Status: DC
  Administered 2020-01-26 – 2020-01-30 (×5): 75 mg via ORAL
  Filled 2020-01-26 (×6): qty 1

## 2020-01-26 MED ORDER — SODIUM CHLORIDE 0.9 % IV SOLN: 1.0000 g | INTRAVENOUS | Status: DC

## 2020-01-26 MED ORDER — POLYVINYL ALCOHOL 1.4 % OP SOLN
1.0000 [drp] | Freq: Four times a day (QID) | OPHTHALMIC | Status: DC
  Filled 2020-01-26: qty 15

## 2020-01-26 MED ORDER — FERROUS SULFATE 325 (65 FE) MG PO TABS
325.0000 mg | ORAL_TABLET | Freq: Every day | ORAL | Status: DC
  Administered 2020-01-26 – 2020-01-30 (×5): 325 mg via ORAL
  Filled 2020-01-26 (×6): qty 1

## 2020-01-26 MED ORDER — POTASSIUM CHLORIDE CRYS ER 20 MEQ PO TBCR
20.0000 meq | EXTENDED_RELEASE_TABLET | Freq: Every day | ORAL | Status: DC
  Administered 2020-01-26 – 2020-01-30 (×5): 20 meq via ORAL
  Filled 2020-01-26 (×6): qty 1

## 2020-01-26 MED ORDER — TAMSULOSIN HCL 0.4 MG PO CAPS
0.8000 mg | ORAL_CAPSULE | Freq: Every day | ORAL | Status: DC
  Administered 2020-01-26 – 2020-01-29 (×4): 0.8 mg via ORAL
  Filled 2020-01-26 (×4): qty 2

## 2020-01-26 MED ORDER — CALCIUM CARBONATE 600 MG PO TABS: 600.0000 mg | ORAL_TABLET | Freq: Two times a day (BID) | ORAL | Status: DC

## 2020-01-26 MED ORDER — HYDROCHLOROTHIAZIDE 12.5 MG PO CAPS
12.5000 mg | ORAL_CAPSULE | Freq: Every day | ORAL | Status: DC
  Administered 2020-01-26 – 2020-01-30 (×5): 12.5 mg via ORAL
  Filled 2020-01-26 (×6): qty 1

## 2020-01-26 MED ORDER — ONDANSETRON HCL 4 MG/2ML IJ SOLN: 4.0000 mg | Freq: Four times a day (QID) | INTRAMUSCULAR | Status: DC | PRN

## 2020-01-26 MED ORDER — OCUVITE-LUTEIN PO CAPS
ORAL_CAPSULE | Freq: Two times a day (BID) | ORAL | Status: DC
  Administered 2020-01-26 – 2020-01-30 (×8): 1 via ORAL
  Filled 2020-01-26 (×11): qty 1

## 2020-01-26 MED ORDER — FENTANYL CITRATE (PF) 100 MCG/2ML IJ SOLN
25.0000 ug | Freq: Once | INTRAMUSCULAR | Status: AC
  Administered 2020-01-26: 25 ug via INTRAVENOUS
  Filled 2020-01-26: qty 2

## 2020-01-26 MED ORDER — PANTOPRAZOLE SODIUM 40 MG PO TBEC
40.0000 mg | DELAYED_RELEASE_TABLET | Freq: Every day | ORAL | Status: DC
  Administered 2020-01-26 – 2020-01-30 (×5): 40 mg via ORAL
  Filled 2020-01-26 (×6): qty 1

## 2020-01-26 MED ORDER — AMLODIPINE BESYLATE 5 MG PO TABS
5.0000 mg | ORAL_TABLET | Freq: Every day | ORAL | Status: DC
  Administered 2020-01-26 – 2020-01-30 (×5): 5 mg via ORAL
  Filled 2020-01-26 (×6): qty 1

## 2020-01-26 MED ORDER — POLYVINYL ALCOHOL 1.4 % OP SOLN
1.0000 [drp] | Freq: Four times a day (QID) | OPHTHALMIC | Status: DC
  Administered 2020-01-26 – 2020-01-30 (×14): 1 [drp] via OPHTHALMIC
  Filled 2020-01-26: qty 15

## 2020-01-26 MED ORDER — SODIUM CHLORIDE 0.9 % IV SOLN
250.0000 mL | INTRAVENOUS | Status: DC | PRN
  Administered 2020-01-27 – 2020-01-28 (×2): 250 mL via INTRAVENOUS

## 2020-01-26 MED ORDER — CALCIUM CARBONATE-VITAMIN D 500-200 MG-UNIT PO TABS
2.0000 | ORAL_TABLET | Freq: Two times a day (BID) | ORAL | Status: DC
  Administered 2020-01-26 – 2020-01-30 (×9): 2 via ORAL
  Filled 2020-01-26 (×10): qty 2

## 2020-01-26 MED ORDER — SODIUM CHLORIDE 0.9% FLUSH
3.0000 mL | Freq: Two times a day (BID) | INTRAVENOUS | Status: DC
  Administered 2020-01-26 – 2020-01-30 (×8): 3 mL via INTRAVENOUS

## 2020-01-26 MED ORDER — LISINOPRIL 10 MG PO TABS
10.0000 mg | ORAL_TABLET | Freq: Every day | ORAL | Status: DC
  Administered 2020-01-26 – 2020-01-30 (×5): 10 mg via ORAL
  Filled 2020-01-26 (×6): qty 1

## 2020-01-26 MED ORDER — MORPHINE SULFATE (PF) 2 MG/ML IV SOLN: 2.0000 mg | INTRAVENOUS | Status: DC | PRN

## 2020-01-26 MED ORDER — SODIUM CHLORIDE 0.9% FLUSH: 3.0000 mL | INTRAVENOUS | Status: DC | PRN

## 2020-01-26 MED ORDER — ACETAMINOPHEN 325 MG PO TABS
975.0000 mg | ORAL_TABLET | Freq: Three times a day (TID) | ORAL | Status: DC
  Administered 2020-01-26 – 2020-01-30 (×13): 975 mg via ORAL
  Filled 2020-01-26 (×13): qty 3

## 2020-01-26 MED ORDER — ONDANSETRON HCL 4 MG PO TABS: 4.0000 mg | ORAL_TABLET | Freq: Four times a day (QID) | ORAL | Status: DC | PRN

## 2020-01-26 MED ORDER — ENOXAPARIN SODIUM 40 MG/0.4ML ~~LOC~~ SOLN
40.0000 mg | SUBCUTANEOUS | Status: DC
  Administered 2020-01-27 – 2020-01-29 (×3): 40 mg via SUBCUTANEOUS
  Filled 2020-01-26 (×3): qty 0.4

## 2020-01-26 NOTE — ED Provider Notes (Signed)
[facility] Emergency Department Provider Note  ____________________________________________   First MD Initiated Contact with Patient 01/26/20 [phone]     (approximate)  I have reviewed the triage vital signs and the nursing notes.   HISTORY  Chief Complaint Fall    HPI Geoffrey Barber is a 86 y.o. male with past medical history of hypertension, GERD, BPH, prostate cancer, here with mechanical fall.  The patient states that he tripped while walking.  He fell, striking his right hip.  He reports immediate onset of severe right hip pain that is exquisite and worse with any kind movement.  He has not been able to walk since then.  He also has some right rib pain that is worse with deep inspiration and moving.  He was well prior to the fall.  Denies any specific alleviating factors.  No lower extremity weakness or numbness.        Past Medical History:  Diagnosis Date  . Anemia   . Arthritis   . BPH without obstruction/lower urinary tract symptoms   . Difficulty walking   . GERD (gastroesophageal reflux disease)   . Hypertension   . Irradiation cystitis with hematuria   . Muscle weakness   . PE (pulmonary embolism)   . Pneumonia   . Prostate cancer ([place])   . UTI (lower urinary tract infection)     Patient Active Problem List   Diagnosis Date Noted  . Acute lower UTI 01/26/2020  . DJD (degenerative joint disease) 06/04/2016  . Acute pulmonary embolism ([place]) 05/11/2016  . Bladder cancer ([place]) 05/11/2016  . Accelerated hypertension 05/11/2016    Past Surgical History:  Procedure Laterality Date  . PERIPHERAL VASCULAR CATHETERIZATION N/A 12/27/2015   Procedure: IVC Filter Insertion;  Surgeon: [name], MD;  Location: [facility];  Service: Cardiovascular;  Laterality: N/A;  . PERIPHERAL VASCULAR CATHETERIZATION N/A 05/19/2016   Procedure: IVC Filter Removal;  Surgeon: [name], MD;  Location: [facility];  Service:  Cardiovascular;  Laterality: N/A;  . Rectal Cyst Removal  1954  . TOTAL KNEE ARTHROPLASTY  2006, 2009    Prior to Admission medications   Medication Sig Start Date End Date Taking? Authorizing Provider  acetaminophen (TYLENOL) 325 MG tablet Take 975 mg by mouth 3 (three) times daily.   Yes [provider]  amLODipine (NORVASC) 5 MG tablet Take 5 mg by mouth daily.   Yes [provider]  amoxicillin-clavulanate (AUGMENTIN) 875-125 MG tablet Take 1 tablet by mouth 2 (two) times daily. 01/22/20  Yes Carrie Mew, MD  Calcium Carbonate-Vitamin D (CALCIUM 600+D) 600-400 MG-UNIT tablet Take 2 tablets by mouth 2 (two) times daily.   Yes [provider]  carboxymethylcellulose (REFRESH PLUS) 0.5 % SOLN Place 1 drop into both eyes 4 (four) times daily.    Yes [provider]  clopidogrel (PLAVIX) 75 MG tablet Take 75 mg by mouth daily.   Yes [provider]  dapsone 25 MG tablet Take 25 mg by mouth See admin instructions. Take 1 tablet daily on Sunday, Monday, Wednesday, and Friday.   Yes [provider]  diclofenac Sodium (VOLTAREN) 1 % GEL Apply 2 g topically 2 (two) times daily as needed.   Yes [provider]  ferrous sulfate 325 (65 FE) MG tablet Take 325 mg by mouth daily with breakfast.   Yes [provider]  hydrochlorothiazide (MICROZIDE) 12.5 MG capsule Take 12.5 mg by mouth daily.    Yes  [provider]  lisinopril (PRINIVIL,ZESTRIL) 20 MG tablet Take 10 mg by mouth daily.    Yes [provider]  loperamide (IMODIUM A-D) 2 MG tablet Take 2 mg by mouth See admin instructions. Administer 4 mg after loose stool, then 2 mg after each subsequent loose stool up to 8 doses in 24 hours as needed.   Yes [provider]  Multiple Vitamins-Minerals (PRESERVISION AREDS 2+MULTI VIT PO) Take 1 capsule by mouth 2 (two) times daily.    Yes [provider]  omeprazole (PRILOSEC) 20 MG capsule Take 20  mg by mouth daily.   Yes [provider]  ondansetron (ZOFRAN ODT) 4 MG disintegrating tablet Take 1 tablet (4 mg total) by mouth every 8 (eight) hours as needed for nausea or vomiting. 01/22/20  Yes Carrie Mew, MD  potassium chloride SA (K-DUR,KLOR-CON) 20 MEQ tablet Take 20 mEq by mouth daily.    Yes [provider]  tamsulosin (FLOMAX) 0.4 MG CAPS capsule Take 0.8 mg by mouth daily.    Yes [provider]  Turmeric, Curcuma [place]a, (TURMERIC ROOT) POWD Take 500 mg by mouth daily.   Yes [provider]  WHITE PETROLATUM-MINERAL OIL OP Apply 1 application to eye at bedtime.   Yes [provider]  calcium carbonate (CALCIUM 600) 600 MG TABS tablet Take 600 mg by mouth 2 (two) times daily with a meal.    [provider]    Allergies Furosemide  Family History  Problem Relation Age of Onset  . Prostate cancer Neg Hx   . Bladder Cancer Neg Hx   . Kidney cancer Neg Hx     Social History Social History   Tobacco Use  . Smoking status: Former Research scientist (life sciences)  . Smokeless tobacco: Never Used  Substance Use Topics  . Alcohol use: Yes    Alcohol/week: 6.0 standard drinks    Types: 6 Glasses of wine per week  . Drug use: Not on file    Review of Systems  Review of Systems  Constitutional: Negative for chills, fatigue and fever.  HENT: Negative for sore throat.   Respiratory: Negative for shortness of breath.   Cardiovascular: Negative for chest pain.  Gastrointestinal: Negative for abdominal pain.  Genitourinary: Negative for flank pain.  Musculoskeletal: Positive for arthralgias and gait problem. Negative for neck pain.  Skin: Negative for rash and wound.  Allergic/Immunologic: Negative for immunocompromised state.  Neurological: Negative for weakness and numbness.  Hematological: Does not bruise/bleed easily.  All other systems reviewed and are negative.    ____________________________________________  PHYSICAL EXAM:       VITAL SIGNS: ED Triage Vitals  Enc Vitals Group     BP 01/26/20 0448 131/74     Pulse Rate 01/26/20 0448 65     Resp 01/26/20 0448 (!) 22     Temp 01/26/20 0448 97.9 F (36.6 C)     Temp Source 01/26/20 0448 Oral     SpO2 01/26/20 0447 95 %     Weight 01/26/20 0449 220 lb (99.8 kg)     Height 01/26/20 0449 6' (1.829 m)     Head Circumference --      Peak Flow --      Pain Score 01/26/20 0518 8     Pain Loc --      Pain Edu? --      Excl. in [place]? --      Physical Exam Vitals and nursing note reviewed.  Constitutional:  General: He is not in acute distress.    Appearance: He is well-developed.  HENT:     Head: Normocephalic and atraumatic.  Eyes:     Conjunctiva/sclera: Conjunctivae normal.  Cardiovascular:     Rate and Rhythm: Normal rate and regular rhythm.     Heart sounds: Normal heart sounds. No murmur heard.  No friction rub.  Pulmonary:     Effort: Pulmonary effort is normal. No respiratory distress.     Breath sounds: Normal breath sounds. No wheezing or rales.  Chest:     Comments: Bruising noted to the right lateral chest wall.  No deformity.  No crepitance. Abdominal:     General: There is no distension.     Palpations: Abdomen is soft.     Tenderness: There is no abdominal tenderness.  Musculoskeletal:     Cervical back: Neck supple.  Skin:    General: Skin is warm.     Capillary Refill: Capillary refill takes less than 2 seconds.  Neurological:     Mental Status: He is alert and oriented to person, place, and time.     Motor: No abnormal muscle tone.       ____________________________________________   LABS (all labs ordered are listed, but only abnormal results are displayed)  Labs Reviewed  CBC WITH DIFFERENTIAL/PLATELET - Abnormal; Notable for the following components:      Result Value   RBC 3.29 (*)    Hemoglobin 10.6 (*)    HCT 31.3 (*)    All other components within normal limits  SARS CORONAVIRUS 2 BY RT PCR (HOSPITAL ORDER,  [facility])  COMPREHENSIVE METABOLIC PANEL  URINALYSIS, COMPLETE (UACMP) WITH MICROSCOPIC    ____________________________________________  EKG: Atrial fibrillation, ventricular rate 73.  QRS 106, QTc 478.  PVCs noted.  Baseline artifact due to tremor.  No acute ischemic changes. ________________________________________  RADIOLOGY All imaging, including plain films, CT scans, and ultrasounds, independently reviewed by me, and interpretations confirmed via formal radiology reads.  ED MD interpretation:   X-ray hip degenerative changes, no acute fracture Chest x-ray: Bibasilar atelectasis CT head: No acute intracranial abnormality, no new stroke CT upright: Chronic fractures of the pelvis, no acetabular fracture  Official radiology report(s): CT Head Wo Contrast  Result Date: 01/26/2020 CLINICAL DATA:  Unwitnessed fall, headache. EXAM: CT HEAD WITHOUT CONTRAST CT CERVICAL SPINE WITHOUT CONTRAST TECHNIQUE: Multidetector CT imaging of the head and cervical spine was performed following the standard protocol without intravenous contrast. Multiplanar CT image reconstructions of the cervical spine were also generated. COMPARISON:  MRI brain 04/05/2012 FINDINGS: CT HEAD FINDINGS Brain: Periventricular white matter and corona radiata hypodensities favor chronic ischemic microvascular white matter disease. Small remote lacunar infarct of the left caudate head. Otherwise, the brainstem, cerebellum, cerebral peduncles, thalamus, basal ganglia, basilar cisterns, and ventricular system appear within normal limits. Chronic intracranial fatty signal along the anterior falx, considered benign/incidental. Vascular: There is atherosclerotic calcification of the cavernous carotid arteries bilaterally. Mildly ectatic proximal left MCA. Skull: Unremarkable Sinuses/Orbits: Mucosal thickening in the ethmoid air cells compatible with mild chronic sinusitis. Along the upper margin of the left  lateral rectus, a 1.3 by 0.4 by 0.4 cm structure with gas density but internal speckling of higher density is present. This slightly flattens the globe's contour. This is an unusual appearance, but this device appears to have been present back on the MRI of 04/05/2012, and this could represent a micropore glaucoma drainage device (GDD) potentially with some physiologic nitrogen  gas accumulation. Given the flattening of the contour, I am highly skeptical that this represents venous gas within a varix. Correlate with patient's ophthalmic history. Other: No supplemental non-categorized findings. CT CERVICAL SPINE FINDINGS Alignment: 1.5 mm degenerative anterolisthesis at C4-5 and C6-7. 1.5 mm degenerative retrolisthesis at C5-6. Skull base and vertebrae: Fused facet joint on the right at C3-4. Prominent loss of intervertebral disc height at C5-6 with questionable partial interbody fusion. Posterior partial interbody fusion at C3-4. Fused facet joint on the right at C7-T1. No cervical spine fracture or acute bony finding. Soft tissues and spinal canal: Bilateral carotid bulb atherosclerotic calcification. Disc levels: Cervical spondylosis contributing to left foraminal impingement at C3-4, C4-5, and C5-6; and right foraminal impingement at C3-4, C4-5, C5-6, and possibly C2-3. Upper chest: Unremarkable Other: No supplemental non-categorized findings. IMPRESSION: 1. No acute intracranial findings or acute cervical spine findings. 2. Periventricular white matter and corona radiata hypodensities favor chronic ischemic microvascular white matter disease. Small remote lacunar infarct of the left caudate head. 3. Along the upper margin of the left lateral rectus, a 1.3 by 0.4 by 0.4 cm structure with gas density but internal speckling of higher density is present. This is an unusual appearance, but this device appears to been present back on the MRI of 04/05/2012, and this could represent a micropore glaucoma drainage device  (GDD) potentially with some physiologic nitrogen gas accumulation. Given the flattening of the contour, I am highly skeptical that this represents venous gas within a varix. Correlate with patient's ophthalmic history. 4. Cervical spondylosis and degenerative disc disease causing multilevel impingement. 5. Atherosclerosis. 6. Mild chronic ethmoid sinusitis. Electronically Signed   By: Van Clines M.D.   On: 01/26/2020 07:02   CT Cervical Spine Wo Contrast  Result Date: 01/26/2020 CLINICAL DATA:  Unwitnessed fall, headache. EXAM: CT HEAD WITHOUT CONTRAST CT CERVICAL SPINE WITHOUT CONTRAST TECHNIQUE: Multidetector CT imaging of the head and cervical spine was performed following the standard protocol without intravenous contrast. Multiplanar CT image reconstructions of the cervical spine were also generated. COMPARISON:  MRI brain 04/05/2012 FINDINGS: CT HEAD FINDINGS Brain: Periventricular white matter and corona radiata hypodensities favor chronic ischemic microvascular white matter disease. Small remote lacunar infarct of the left caudate head. Otherwise, the brainstem, cerebellum, cerebral peduncles, thalamus, basal ganglia, basilar cisterns, and ventricular system appear within normal limits. Chronic intracranial fatty signal along the anterior falx, considered benign/incidental. Vascular: There is atherosclerotic calcification of the cavernous carotid arteries bilaterally. Mildly ectatic proximal left MCA. Skull: Unremarkable Sinuses/Orbits: Mucosal thickening in the ethmoid air cells compatible with mild chronic sinusitis. Along the upper margin of the left lateral rectus, a 1.3 by 0.4 by 0.4 cm structure with gas density but internal speckling of higher density is present. This slightly flattens the globe's contour. This is an unusual appearance, but this device appears to have been present back on the MRI of 04/05/2012, and this could represent a micropore glaucoma drainage device (GDD) potentially  with some physiologic nitrogen gas accumulation. Given the flattening of the contour, I am highly skeptical that this represents venous gas within a varix. Correlate with patient's ophthalmic history. Other: No supplemental non-categorized findings. CT CERVICAL SPINE FINDINGS Alignment: 1.5 mm degenerative anterolisthesis at C4-5 and C6-7. 1.5 mm degenerative retrolisthesis at C5-6. Skull base and vertebrae: Fused facet joint on the right at C3-4. Prominent loss of intervertebral disc height at C5-6 with questionable partial interbody fusion. Posterior partial interbody fusion at C3-4. Fused facet joint on the right at C7-T1. No  cervical spine fracture or acute bony finding. Soft tissues and spinal canal: Bilateral carotid bulb atherosclerotic calcification. Disc levels: Cervical spondylosis contributing to left foraminal impingement at C3-4, C4-5, and C5-6; and right foraminal impingement at C3-4, C4-5, C5-6, and possibly C2-3. Upper chest: Unremarkable Other: No supplemental non-categorized findings. IMPRESSION: 1. No acute intracranial findings or acute cervical spine findings. 2. Periventricular white matter and corona radiata hypodensities favor chronic ischemic microvascular white matter disease. Small remote lacunar infarct of the left caudate head. 3. Along the upper margin of the left lateral rectus, a 1.3 by 0.4 by 0.4 cm structure with gas density but internal speckling of higher density is present. This is an unusual appearance, but this device appears to been present back on the MRI of 04/05/2012, and this could represent a micropore glaucoma drainage device (GDD) potentially with some physiologic nitrogen gas accumulation. Given the flattening of the contour, I am highly skeptical that this represents venous gas within a varix. Correlate with patient's ophthalmic history. 4. Cervical spondylosis and degenerative disc disease causing multilevel impingement. 5. Atherosclerosis. 6. Mild chronic ethmoid  sinusitis. Electronically Signed   By: Van Clines M.D.   On: 01/26/2020 07:02   CT Hip Right Wo Contrast  Result Date: 01/26/2020 CLINICAL DATA:  Unwitnessed fall, right hip pain. EXAM: CT OF THE RIGHT HIP WITHOUT CONTRAST TECHNIQUE: Multidetector CT imaging of the right hip was performed according to the standard protocol. Multiplanar CT image reconstructions were also generated. COMPARISON:  Radiograph 01/26/2020 FINDINGS: Bones/Joint/Cartilage Extensive chronic fractures of the pubic bodies and adjacent pubic rami with resultant deformities. Although some of the fragments do not have corticated margins, the appearance is not substantially changed from 12/11/2015 and accordingly a new acute component is not observed. There is spurring of the right femoral head but without a well-defined right proximal femoral fracture. Ligaments Suboptimally assessed by CT. Muscles and Tendons Thickened right iliotibial band along the hip, although not changed from 01/22/2020, with stable mild overlying subcutaneous edema. Atrophic gluteus medius and gluteus minimus muscles. Soft tissues Stable subtle presacral edema. Right common femoral artery atherosclerotic calcification. IMPRESSION: 1. Extensive chronic fractures of the pubic bodies and adjacent pubic rami with resultant deformities. Although some of the fragments do not have corticated margins, the appearance is not substantially changed from 12/11/2015 and accordingly a new acute component is not observed. 2. No fracture of the acetabulum or right proximal femur is apparent on CT. 3. Thickened right iliotibial band along the hip, although not changed from 01/22/2020, with stable mild overlying subcutaneous edema. 4. Atrophic gluteus medius and gluteus minimus muscles. 5. Right common femoral artery atherosclerotic calcification. Electronically Signed   By: Van Clines M.D.   On: 01/26/2020 07:12   DG Chest Portable 1 View  Result Date:  01/26/2020 CLINICAL DATA:  Chest pain. EXAM: PORTABLE CHEST 1 VIEW COMPARISON:  Chest x-ray 01/22/2020.  CT abdomen 01/22/2020. FINDINGS: Mediastinum hilar structures normal. Heart size stable. Low lung volumes with mild bibasilar subsegmental atelectasis. No pleural effusion or pneumothorax. Prominent skin fold on the left. Degenerative change and scoliosis thoracic spine. Degenerative changes both shoulders. No air noted under the hemidiaphragms as previously questioned on prior study. IMPRESSION: 1.  Low lung volumes with mild bibasilar subsegmental atelectasis. 2. No air noted under the hemidiaphragms as previously questioned prior chest x-ray of 01/22/2020. Electronically Signed   By: Marcello Moores  Register   On: 01/26/2020 06:08   DG Hip Unilat W or Wo Pelvis 2-3 Views Right  Result Date:  01/26/2020 CLINICAL DATA:  Fall. EXAM: DG HIP (WITH OR WITHOUT PELVIS) 2-3V RIGHT COMPARISON:  CT 01/22/2020, 12/11/2015. FINDINGS: Degenerative change lumbar spine and both hips. Stable chronic deformity noted of the pubis bilaterally. No acute bony or joint abnormality identified. No evidence of acute fracture or dislocation. Right hip is intact. Peripheral vascular calcification. IMPRESSION: 1. Degenerative changes lumbar spine and both hips. Stable chronic deformity of the pubis bilaterally. 2.  No acute abnormality identified.  Right hip is intact. 3.  Peripheral vascular disease. Electronically Signed   By: Marcello Moores  Register   On: 01/26/2020 06:11    ____________________________________________  PROCEDURES   Procedure(s) performed (including Critical Care):  Procedures  ____________________________________________  INITIAL IMPRESSION / MDM / [place] / ED COURSE  As part of my medical decision making, I reviewed the following data within the [place] notes reviewed and incorporated, Old chart reviewed, Notes from prior ED visits, and [place] Controlled Substance Database        *Geoffrey Barber was evaluated in Emergency Department on 01/26/2020 for the symptoms described in the history of present illness. He was evaluated in the context of the global COVID-19 pandemic, which necessitated consideration that the patient might be at risk for infection with the SARS-CoV-2 virus that causes COVID-19. Institutional protocols and algorithms that pertain to the evaluation of patients at risk for COVID-19 are in a state of rapid change based on information released by regulatory bodies including the CDC and federal and state organizations. These policies and algorithms were followed during the patient's care in the ED.  Some ED evaluations and interventions may be delayed as a result of limited staffing during the pandemic.*     Medical Decision Making: 86 year old male here with fall, pelvic pain, inability to ambulate.  Imaging shows chronic pelvic fractures, though clinically I suspect an acute component as well.  Patient has exquisite pain with any kind of movement, and is unable to get out of bed.  He lives alone.  Of note, the patient was just seen and treated for UTI several days ago, I suspect this may have contributed to his fall.  Urine culture grew out E. coli with pan sensitivity.  Will treat for this, plan to admit for pain control and PT.  ____________________________________________  FINAL CLINICAL IMPRESSION(S) / ED DIAGNOSES  Final diagnoses:  Fall, initial encounter  Lower urinary tract infectious disease  Closed nondisplaced fracture of pelvis with delayed healing, unspecified part of pelvis, subsequent encounter     MEDICATIONS GIVEN DURING THIS VISIT:  Medications  cefTRIAXone (ROCEPHIN) 1 g in sodium chloride 0.9 % 100 mL IVPB (has no administration in time range)  acetaminophen (TYLENOL) tablet 650 mg (has no administration in time range)  fentaNYL (SUBLIMAZE) injection 25 mcg (25 mcg Intravenous Given 01/26/20 4034)     ED Discharge Orders     None       Note:  This document was prepared using Dragon voice recognition software and may include unintentional dictation errors.   Duffy Bruce, MD 01/26/20 [id number]
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
## 2020-01-26 NOTE — Progress Notes (Signed)
*  PRELIMINARY RESULTS* Echocardiogram 2D Echocardiogram has been performed.  Geoffrey Barber 01/26/2020, 4:54 PM

## 2020-01-26 NOTE — H&P (Signed)
History and Physical    Geoffrey Barber QPY:[id number] DOB: Jan 21, 1925 DOA: 01/26/2020  PCP: Derinda Late, MD   Patient coming from: Assisted living  I have personally briefly reviewed patient's old medical records in [place]  Chief Complaint: Status post fall  HPI: Geoffrey Barber is a 86 y.o. male with medical history significant for prostate cancer, hypertension, GERD and BPH who presents to the emergency room via EMS after an unwitnessed fall.  Staff reports hearing patient fall in the room, upon entering they noted that the trash can was broken.  Patient states that he tripped and fell striking his right hip and landing on the trash can. He denies hitting his head or any loss of consciousness.  He was noted to have an abrasion/bruise to his right lower back and was able to walk back to bed with assistance.  He was brought into the ER for evaluation of severe right hip pain that is worse with any form of movement.  Patient is unable to ambulate due to the pain.  He also complains of pain in his right rib cage that is worse with deep inspiration and moving.  He denies feeling dizzy or lightheaded, he denies having any chest pain, no nausea, no vomiting, no fever, no cough, no changes in his bowel habits. Chest x-ray reviewed by me shows no acute findings Right hip x-ray showed degenerative changes lumbar spine and both hips. Stable chronic deformity of the pubis bilaterally. No acute abnormality identified. Right hip is intact. Peripheral vascular disease. He had a CT scan of the right hip which showed  extensive chronic fractures of the pubic bodies and adjacent pubic rami with resultant deformities. Although some of the fragments do not have corticated margins, the appearance is not substantially changed from 12/11/2015 and accordingly a new acute component is not Observed. There is spurring of the right femoral head but without a well-defined right proximal femoral  fracture. Twelve-lead EKG reviewed by me shows A. fib with PVCs  ED Course: Patient is a 86 year old Caucasian male who lives independently and was brought to the ER for evaluation after an unwitnessed fall for evaluation of right hip pain and difficulty with ambulation.  Imaging does not show an acute fracture but patient is unable to ambulate and will be admitted to the hospital for further evaluation  Review of Systems: As per HPI otherwise 10 point review of systems negative.   Past Medical History:  Diagnosis Date  . Anemia   . Arthritis   . BPH without obstruction/lower urinary tract symptoms   . Difficulty walking   . GERD (gastroesophageal reflux disease)   . Hypertension   . Irradiation cystitis with hematuria   . Muscle weakness   . PE (pulmonary embolism)   . Pneumonia   . Prostate cancer ([place])   . UTI (lower urinary tract infection)     Past Surgical History:  Procedure Laterality Date  . PERIPHERAL VASCULAR CATHETERIZATION N/A 12/27/2015   Procedure: IVC Filter Insertion;  Surgeon: [name], MD;  Location: [facility];  Service: Cardiovascular;  Laterality: N/A;  . PERIPHERAL VASCULAR CATHETERIZATION N/A 05/19/2016   Procedure: IVC Filter Removal;  Surgeon: [name], MD;  Location: [facility];  Service: Cardiovascular;  Laterality: N/A;  . Rectal Cyst Removal  1954  . TOTAL KNEE ARTHROPLASTY  2006, 2009     reports that he has quit smoking. He has never used smokeless tobacco. He reports current  alcohol use of about 6.0 standard drinks of alcohol per week. No history on file for drug use.  Allergies  Allergen Reactions  . Furosemide Rash    Family History  Problem Relation Age of Onset  . Prostate cancer Neg Hx   . Bladder Cancer Neg Hx   . Kidney cancer Neg Hx      Prior to Admission medications   Medication Sig Start Date End Date Taking? Authorizing Provider  acetaminophen (TYLENOL) 325 MG tablet Take 975 mg by  mouth 3 (three) times daily.   Yes [provider]  amLODipine (NORVASC) 5 MG tablet Take 5 mg by mouth daily.   Yes [provider]  amoxicillin-clavulanate (AUGMENTIN) 875-125 MG tablet Take 1 tablet by mouth 2 (two) times daily. 01/22/20  Yes Carrie Mew, MD  Calcium Carbonate-Vitamin D (CALCIUM 600+D) 600-400 MG-UNIT tablet Take 2 tablets by mouth 2 (two) times daily.   Yes [provider]  carboxymethylcellulose (REFRESH PLUS) 0.5 % SOLN Place 1 drop into both eyes 4 (four) times daily.    Yes [provider]  clopidogrel (PLAVIX) 75 MG tablet Take 75 mg by mouth daily.   Yes [provider]  dapsone 25 MG tablet Take 25 mg by mouth See admin instructions. Take 1 tablet daily on Sunday, Monday, Wednesday, and Friday.   Yes [provider]  diclofenac Sodium (VOLTAREN) 1 % GEL Apply 2 g topically 2 (two) times daily as needed.   Yes [provider]  ferrous sulfate 325 (65 FE) MG tablet Take 325 mg by mouth daily with breakfast.   Yes [provider]  hydrochlorothiazide (MICROZIDE) 12.5 MG capsule Take 12.5 mg by mouth daily.    Yes [provider]  lisinopril (PRINIVIL,ZESTRIL) 20 MG tablet Take 10 mg by mouth daily.    Yes [provider]  loperamide (IMODIUM A-D) 2 MG tablet Take 2 mg by mouth See admin instructions. Administer 4 mg after loose stool, then 2 mg after each subsequent loose stool up to 8 doses in 24 hours as needed.   Yes [provider]  Multiple Vitamins-Minerals (PRESERVISION AREDS 2+MULTI VIT PO) Take 1 capsule by mouth 2 (two) times daily.    Yes [provider]  omeprazole (PRILOSEC) 20 MG capsule Take 20 mg by mouth daily.   Yes [provider]  ondansetron (ZOFRAN ODT) 4 MG disintegrating tablet Take 1 tablet (4 mg total) by mouth every 8 (eight) hours as needed for nausea or vomiting. 01/22/20  Yes Carrie Mew, MD  potassium chloride SA  (K-DUR,KLOR-CON) 20 MEQ tablet Take 20 mEq by mouth daily.    Yes [provider]  tamsulosin (FLOMAX) 0.4 MG CAPS capsule Take 0.8 mg by mouth daily.    Yes [provider]  Turmeric, Curcuma Longa, (TURMERIC ROOT) POWD Take 500 mg by mouth daily.   Yes [provider]  WHITE PETROLATUM-MINERAL OIL OP Apply 1 application to eye at bedtime.   Yes [provider]  calcium carbonate (CALCIUM 600) 600 MG TABS tablet Take 600 mg by mouth 2 (two) times daily with a meal.    [provider]    Physical Exam: Vitals:   01/26/20 0458 01/26/20 0500 01/26/20 0530 01/26/20 0723  BP:  112/69 110/62 99/67  Pulse: 62 60 (!) 55 63  Resp: (!) 22  (!) 24 19  Temp:      TempSrc:      SpO2: 100% 94% 97% 95%  Weight:  Height:         Vitals:   01/26/20 0458 01/26/20 0500 01/26/20 0530 01/26/20 0723  BP:  112/69 110/62 99/67  Pulse: 62 60 (!) 55 63  Resp: (!) 22  (!) 24 19  Temp:      TempSrc:      SpO2: 100% 94% 97% 95%  Weight:      Height:        Constitutional: NAD, alert and oriented x 3  Eyes: PERRL, lids and conjunctivae pallor ENMT: Mucous membranes are moist.  Hard of hearing Neck: normal, supple, no masses, no thyromegaly Respiratory: clear to auscultation bilaterally, no wheezing, no crackles. Normal respiratory effort. No accessory muscle use.  Cardiovascular: Irregularly irregular,no murmurs / rubs / gallops. 2+ extremity edema. 2+ pedal pulses. No carotid bruits.  Abdomen: no tenderness, no masses palpated. No hepatosplenomegaly. Bowel sounds positive.  Musculoskeletal: no clubbing / cyanosis. No joint deformity upper and lower extremities.  Tenderness right hip Skin: no rashes, lesions, ulcers.  Neurologic: No gross focal neurologic deficit. Psychiatric: Normal mood and affect.   Labs on Admission: I have personally reviewed following labs and imaging studies  CBC: Recent Labs  Lab 01/22/20 1020 01/26/20 0446  WBC 5.5 8.3   NEUTROABS 2.9 5.7  HGB 11.8* 10.6*  HCT 34.1* 31.3*  MCV 92.9 95.1  PLT 231 456   Basic Metabolic Panel: Recent Labs  Lab 01/22/20 1020 01/26/20 0446  NA 139 141  K 4.1 3.9  CL 107 108  CO2 23 24  GLUCOSE 124* 126*  BUN 46* 58*  CREATININE 1.42* 1.50*  CALCIUM 9.0 9.4   GFR: Estimated Creatinine Clearance: 37.6 mL/min (A) (by C-G formula based on SCr of 1.5 mg/dL (H)). Liver Function Tests: Recent Labs  Lab 01/26/20 0446  AST 29  ALT 20  ALKPHOS 46  BILITOT 0.9  PROT 6.9  ALBUMIN 4.1   No results for input(s): LIPASE, AMYLASE in the last 168 hours. No results for input(s): AMMONIA in the last 168 hours. Coagulation Profile: No results for input(s): INR, PROTIME in the last 168 hours. Cardiac Enzymes: No results for input(s): CKTOTAL, CKMB, CKMBINDEX, TROPONINI in the last 168 hours. BNP (last 3 results) No results for input(s): PROBNP in the last 8760 hours. HbA1C: No results for input(s): HGBA1C in the last 72 hours. CBG: No results for input(s): GLUCAP in the last 168 hours. Lipid Profile: No results for input(s): CHOL, HDL, LDLCALC, TRIG, CHOLHDL, LDLDIRECT in the last 72 hours. Thyroid Function Tests: No results for input(s): TSH, T4TOTAL, FREET4, T3FREE, THYROIDAB in the last 72 hours. Anemia Panel: No results for input(s): VITAMINB12, FOLATE, FERRITIN, TIBC, IRON, RETICCTPCT in the last 72 hours. Urine analysis:    Component Value Date/Time   COLORURINE RED (A) 01/22/2020 1043   APPEARANCEUR TURBID (A) 01/22/2020 1043   APPEARANCEUR Clear 01/23/2016 0959   LABSPEC 1.021 01/22/2020 1043   LABSPEC 1.009 02/24/2012 0716   PHURINE  01/22/2020 1043    TEST NOT REPORTED DUE TO COLOR INTERFERENCE OF URINE PIGMENT   GLUCOSEU (A) 01/22/2020 1043    TEST NOT REPORTED DUE TO COLOR INTERFERENCE OF URINE PIGMENT   GLUCOSEU Negative 02/24/2012 0716   HGBUR (A) 01/22/2020 1043    TEST NOT REPORTED DUE TO COLOR INTERFERENCE OF URINE PIGMENT   BILIRUBINUR (A)  01/22/2020 1043    TEST NOT REPORTED DUE TO COLOR INTERFERENCE OF URINE PIGMENT   BILIRUBINUR Negative 01/23/2016 0959   BILIRUBINUR Negative 02/24/2012 0716   KETONESUR (  A) 01/22/2020 1043    TEST NOT REPORTED DUE TO COLOR INTERFERENCE OF URINE PIGMENT   PROTEINUR (A) 01/22/2020 1043    TEST NOT REPORTED DUE TO COLOR INTERFERENCE OF URINE PIGMENT   NITRITE (A) 01/22/2020 1043    TEST NOT REPORTED DUE TO COLOR INTERFERENCE OF URINE PIGMENT   LEUKOCYTESUR (A) 01/22/2020 1043    TEST NOT REPORTED DUE TO COLOR INTERFERENCE OF URINE PIGMENT   LEUKOCYTESUR Negative 02/24/2012 0716    Radiological Exams on Admission: CT Head Wo Contrast  Result Date: 01/26/2020 CLINICAL DATA:  Unwitnessed fall, headache. EXAM: CT HEAD WITHOUT CONTRAST CT CERVICAL SPINE WITHOUT CONTRAST TECHNIQUE: Multidetector CT imaging of the head and cervical spine was performed following the standard protocol without intravenous contrast. Multiplanar CT image reconstructions of the cervical spine were also generated. COMPARISON:  MRI brain 04/05/2012 FINDINGS: CT HEAD FINDINGS Brain: Periventricular white matter and corona radiata hypodensities favor chronic ischemic microvascular white matter disease. Small remote lacunar infarct of the left caudate head. Otherwise, the brainstem, cerebellum, cerebral peduncles, thalamus, basal ganglia, basilar cisterns, and ventricular system appear within normal limits. Chronic intracranial fatty signal along the anterior falx, considered benign/incidental. Vascular: There is atherosclerotic calcification of the cavernous carotid arteries bilaterally. Mildly ectatic proximal left MCA. Skull: Unremarkable Sinuses/Orbits: Mucosal thickening in the ethmoid air cells compatible with mild chronic sinusitis. Along the upper margin of the left lateral rectus, a 1.3 by 0.4 by 0.4 cm structure with gas density but internal speckling of higher density is present. This slightly flattens the globe's contour.  This is an unusual appearance, but this device appears to have been present back on the MRI of 04/05/2012, and this could represent a micropore glaucoma drainage device (GDD) potentially with some physiologic nitrogen gas accumulation. Given the flattening of the contour, I am highly skeptical that this represents venous gas within a varix. Correlate with patient's ophthalmic history. Other: No supplemental non-categorized findings. CT CERVICAL SPINE FINDINGS Alignment: 1.5 mm degenerative anterolisthesis at C4-5 and C6-7. 1.5 mm degenerative retrolisthesis at C5-6. Skull base and vertebrae: Fused facet joint on the right at C3-4. Prominent loss of intervertebral disc height at C5-6 with questionable partial interbody fusion. Posterior partial interbody fusion at C3-4. Fused facet joint on the right at C7-T1. No cervical spine fracture or acute bony finding. Soft tissues and spinal canal: Bilateral carotid bulb atherosclerotic calcification. Disc levels: Cervical spondylosis contributing to left foraminal impingement at C3-4, C4-5, and C5-6; and right foraminal impingement at C3-4, C4-5, C5-6, and possibly C2-3. Upper chest: Unremarkable Other: No supplemental non-categorized findings. IMPRESSION: 1. No acute intracranial findings or acute cervical spine findings. 2. Periventricular white matter and corona radiata hypodensities favor chronic ischemic microvascular white matter disease. Small remote lacunar infarct of the left caudate head. 3. Along the upper margin of the left lateral rectus, a 1.3 by 0.4 by 0.4 cm structure with gas density but internal speckling of higher density is present. This is an unusual appearance, but this device appears to been present back on the MRI of 04/05/2012, and this could represent a micropore glaucoma drainage device (GDD) potentially with some physiologic nitrogen gas accumulation. Given the flattening of the contour, I am highly skeptical that this represents venous gas within  a varix. Correlate with patient's ophthalmic history. 4. Cervical spondylosis and degenerative disc disease causing multilevel impingement. 5. Atherosclerosis. 6. Mild chronic ethmoid sinusitis. Electronically Signed   By: Van Clines M.D.   On: 01/26/2020 07:02   CT Cervical Spine Wo  Contrast  Result Date: 01/26/2020 CLINICAL DATA:  Unwitnessed fall, headache. EXAM: CT HEAD WITHOUT CONTRAST CT CERVICAL SPINE WITHOUT CONTRAST TECHNIQUE: Multidetector CT imaging of the head and cervical spine was performed following the standard protocol without intravenous contrast. Multiplanar CT image reconstructions of the cervical spine were also generated. COMPARISON:  MRI brain 04/05/2012 FINDINGS: CT HEAD FINDINGS Brain: Periventricular white matter and corona radiata hypodensities favor chronic ischemic microvascular white matter disease. Small remote lacunar infarct of the left caudate head. Otherwise, the brainstem, cerebellum, cerebral peduncles, thalamus, basal ganglia, basilar cisterns, and ventricular system appear within normal limits. Chronic intracranial fatty signal along the anterior falx, considered benign/incidental. Vascular: There is atherosclerotic calcification of the cavernous carotid arteries bilaterally. Mildly ectatic proximal left MCA. Skull: Unremarkable Sinuses/Orbits: Mucosal thickening in the ethmoid air cells compatible with mild chronic sinusitis. Along the upper margin of the left lateral rectus, a 1.3 by 0.4 by 0.4 cm structure with gas density but internal speckling of higher density is present. This slightly flattens the globe's contour. This is an unusual appearance, but this device appears to have been present back on the MRI of 04/05/2012, and this could represent a micropore glaucoma drainage device (GDD) potentially with some physiologic nitrogen gas accumulation. Given the flattening of the contour, I am highly skeptical that this represents venous gas within a varix. Correlate  with patient's ophthalmic history. Other: No supplemental non-categorized findings. CT CERVICAL SPINE FINDINGS Alignment: 1.5 mm degenerative anterolisthesis at C4-5 and C6-7. 1.5 mm degenerative retrolisthesis at C5-6. Skull base and vertebrae: Fused facet joint on the right at C3-4. Prominent loss of intervertebral disc height at C5-6 with questionable partial interbody fusion. Posterior partial interbody fusion at C3-4. Fused facet joint on the right at C7-T1. No cervical spine fracture or acute bony finding. Soft tissues and spinal canal: Bilateral carotid bulb atherosclerotic calcification. Disc levels: Cervical spondylosis contributing to left foraminal impingement at C3-4, C4-5, and C5-6; and right foraminal impingement at C3-4, C4-5, C5-6, and possibly C2-3. Upper chest: Unremarkable Other: No supplemental non-categorized findings. IMPRESSION: 1. No acute intracranial findings or acute cervical spine findings. 2. Periventricular white matter and corona radiata hypodensities favor chronic ischemic microvascular white matter disease. Small remote lacunar infarct of the left caudate head. 3. Along the upper margin of the left lateral rectus, a 1.3 by 0.4 by 0.4 cm structure with gas density but internal speckling of higher density is present. This is an unusual appearance, but this device appears to been present back on the MRI of 04/05/2012, and this could represent a micropore glaucoma drainage device (GDD) potentially with some physiologic nitrogen gas accumulation. Given the flattening of the contour, I am highly skeptical that this represents venous gas within a varix. Correlate with patient's ophthalmic history. 4. Cervical spondylosis and degenerative disc disease causing multilevel impingement. 5. Atherosclerosis. 6. Mild chronic ethmoid sinusitis. Electronically Signed   By: Van Clines M.D.   On: 01/26/2020 07:02   CT Hip Right Wo Contrast  Result Date: 01/26/2020 CLINICAL DATA:  Unwitnessed  fall, right hip pain. EXAM: CT OF THE RIGHT HIP WITHOUT CONTRAST TECHNIQUE: Multidetector CT imaging of the right hip was performed according to the standard protocol. Multiplanar CT image reconstructions were also generated. COMPARISON:  Radiograph 01/26/2020 FINDINGS: Bones/Joint/Cartilage Extensive chronic fractures of the pubic bodies and adjacent pubic rami with resultant deformities. Although some of the fragments do not have corticated margins, the appearance is not substantially changed from 12/11/2015 and accordingly a new acute component is not  observed. There is spurring of the right femoral head but without a well-defined right proximal femoral fracture. Ligaments Suboptimally assessed by CT. Muscles and Tendons Thickened right iliotibial band along the hip, although not changed from 01/22/2020, with stable mild overlying subcutaneous edema. Atrophic gluteus medius and gluteus minimus muscles. Soft tissues Stable subtle presacral edema. Right common femoral artery atherosclerotic calcification. IMPRESSION: 1. Extensive chronic fractures of the pubic bodies and adjacent pubic rami with resultant deformities. Although some of the fragments do not have corticated margins, the appearance is not substantially changed from 12/11/2015 and accordingly a new acute component is not observed. 2. No fracture of the acetabulum or right proximal femur is apparent on CT. 3. Thickened right iliotibial band along the hip, although not changed from 01/22/2020, with stable mild overlying subcutaneous edema. 4. Atrophic gluteus medius and gluteus minimus muscles. 5. Right common femoral artery atherosclerotic calcification. Electronically Signed   By: Van Clines M.D.   On: 01/26/2020 07:12   DG Chest Portable 1 View  Result Date: 01/26/2020 CLINICAL DATA:  Chest pain. EXAM: PORTABLE CHEST 1 VIEW COMPARISON:  Chest x-ray 01/22/2020.  CT abdomen 01/22/2020. FINDINGS: Mediastinum hilar structures normal. Heart size  stable. Low lung volumes with mild bibasilar subsegmental atelectasis. No pleural effusion or pneumothorax. Prominent skin fold on the left. Degenerative change and scoliosis thoracic spine. Degenerative changes both shoulders. No air noted under the hemidiaphragms as previously questioned on prior study. IMPRESSION: 1.  Low lung volumes with mild bibasilar subsegmental atelectasis. 2. No air noted under the hemidiaphragms as previously questioned prior chest x-ray of 01/22/2020. Electronically Signed   By: Marcello Moores  Register   On: 01/26/2020 06:08   DG Hip Unilat W or Wo Pelvis 2-3 Views Right  Result Date: 01/26/2020 CLINICAL DATA:  Fall. EXAM: DG HIP (WITH OR WITHOUT PELVIS) 2-3V RIGHT COMPARISON:  CT 01/22/2020, 12/11/2015. FINDINGS: Degenerative change lumbar spine and both hips. Stable chronic deformity noted of the pubis bilaterally. No acute bony or joint abnormality identified. No evidence of acute fracture or dislocation. Right hip is intact. Peripheral vascular calcification. IMPRESSION: 1. Degenerative changes lumbar spine and both hips. Stable chronic deformity of the pubis bilaterally. 2.  No acute abnormality identified.  Right hip is intact. 3.  Peripheral vascular disease. Electronically Signed   By: Marcello Moores  Register   On: 01/26/2020 06:11    EKG: Independently reviewed.  Atrial fibrillation with PVCs  Assessment/Plan Principal Problem:   Acute right hip pain Active Problems:   Hypertension   Acute lower UTI   Impaired ambulation   BPH without obstruction/lower urinary tract symptoms   Unspecified atrial fibrillation (HCC)     Acute right hip pain Following a fall with difficulty ambulating Imaging did not show an acute fracture but shows  extensive chronic fractures of the pubic bodies and adjacent pubic rami with resultant deformities.  Pain control We will request PT evaluation Place patient on fall precautions   UTI Patient was seen in the emergency room 3 days prior  to this hospitalization and had pyuria at that time Urine culture yields E. Coli We will treat patient empirically with Rocephin 1 g IV daily to complete a 3-day course of therapy   Atrial fibrillation Appears to be new onset Patient is currently rate controlled He has a CHADS2VASC score of 4 and ideally requires long-term anticoagulation as primary prophylaxis for an acute stroke Patient is not a candidate for long-term anticoagulation due to history of recurrent hematuria requiring blood transfusion We will  obtain 2D echocardiogram to assess LVEF Consult cardiology   Hypertension Continue hydrochlorothiazide, lisinopril and amlodipine   BPH Continue Flomax  DVT prophylaxis: Lovenox Code Status: Full code Family Communication: Greater than 50% of time was spent discussing plan of care with patient at the bedside.  All questions and concerns have been addressed.  He verbalizes understanding and agrees with the plan Disposition Plan: Back to previous home environment Consults called: Cardiology    Novak Stgermaine MD [facility]     01/26/2020, 8:27 AM

## 2020-01-26 NOTE — TOC Initial Note (Signed)
Transition of Care [facility]) - Initial/Assessment Note    Patient Details  Name: Geoffrey Barber MRN: [id number] Date of Birth: 04-21-1925  Transition of [facility]) CM/SW Contact:    Adelene Amas, [place] Phone Number: 01/26/2020, 1:56 PM  Clinical Narrative:                  Patient presents to [facility] ED due to fall and generalized weakness from [place].  Joelene Millin [phone] from [place] contact the [place] to inform me the patient will return to [facility] on d/c.  Expected Discharge Plan: [place]     Patient Goals and CMS Choice Patient states their goals for this hospitalization and ongoing recovery are:: Patient is a current resident of [facility]. CMS Medicare.gov Compare Post Acute Care list provided to::  (Patient currently lives at [facility], [facility])    Expected Discharge Plan and Services Expected Discharge Plan: [place] In-house Referral: Clinical Social Work     Living arrangements for the past 2 months: [place] ([place])                                      Prior Living Arrangements/Services Living arrangements for the past 2 months: [place] ([place]) Lives with:: Facility Resident Patient language and need for interpreter reviewed:: Yes Do you feel safe going back to the place where you live?: Yes      Need for Family Participation in Patient Care: Yes (Comment) Care giver support system in place?: Yes (comment)   Criminal Activity/Legal Involvement Pertinent to Current Situation/Hospitalization: No - Comment as needed  Activities of Daily Living      Permission Sought/Granted Permission sought to share information with : Facility Sport and exercise psychologist    Share Information with NAME: Joelene Millin  Permission granted to share info w AGENCY: [facility]  granted to share info w Relationship: Javonn Gauger, [phone] spouse  Permission granted to share info w Contact Information: [phone]  Emotional Assessment Appearance:: Appears stated age Attitude/Demeanor/Rapport: Engaged Affect (typically observed): Pleasant Orientation: : Oriented to Self, Oriented to Place, Oriented to Situation Alcohol / Substance Use: Not Applicable Psych Involvement: No (comment)  Admission diagnosis:  Right hip pain [M25.551] Patient Active Problem List   Diagnosis Date Noted  . Acute lower UTI 01/26/2020  . Impaired ambulation 01/26/2020  . Acute right hip pain 01/26/2020  . Right hip pain 01/26/2020  . BPH without obstruction/lower urinary tract symptoms   . Unspecified atrial fibrillation ([place])   . DJD (degenerative joint disease) 06/04/2016  . Acute pulmonary embolism ([place]) 05/11/2016  . Bladder cancer ([place]) 05/11/2016  . Hypertension 05/11/2016   PCP:  Derinda Late, MD Pharmacy:   [facility] [address], [place] [address] [place] [address] Phone: [phone] Fax: [phone]     Social Determinants of Health (SDOH) Interventions    Readmission Risk Interventions No flowsheet data found.

## 2020-01-26 NOTE — Consult Note (Signed)
[facility] Consultation Note  Patient ID: Geoffrey Barber, MRN: [id number], DOB/AGE: Jan 25, 1925 86 y.o. Admit date: 01/26/2020   Date of Consult: 01/26/2020 Primary Physician: Derinda Late, MD Primary Cardiologist: None  Chief Complaint:  Chief Complaint  Patient presents with  . Fall   Reason for Consult: Hypertension hyperlipidemia fall with abnormal EKG  HPI: 86 y.o. male with none hypertension hyperlipidemia on appropriate medication management for which the patient has some chronic kidney disease stage III with glomerular filtration rate of 40 anemia with a hemoglobin of 10.6.  The patient has had a fall and no orthopedic injury for which the patient is recovering.  Currently the patient has no evidence of chest discomfort or congestive heart failure type symptoms.  By EKG he has had normal sinus rhythm with a heart rate of 60 bpm and nonspecific ST changes.  Other EKGs have shown frequent preventricular contractions and ectopic atrial rhythms.  After review of all EKGs there is no evidence of atrial fibrillation at this time although significant ectopy and ectopic atrial rhythms.  He has not had any syncope per se although the fall may be related to some of this issue.  Currently the patient is stable  Past Medical History:  Diagnosis Date  . Anemia   . Arthritis   . BPH without obstruction/lower urinary tract symptoms   . Difficulty walking   . GERD (gastroesophageal reflux disease)   . Hypertension   . Irradiation cystitis with hematuria   . Muscle weakness   . PE (pulmonary embolism)   . Pneumonia   . Prostate cancer ([place])   . UTI (lower urinary tract infection)       Surgical History:  Past Surgical History:  Procedure Laterality Date  . PERIPHERAL VASCULAR CATHETERIZATION N/A 12/27/2015   Procedure: IVC Filter Insertion;  Surgeon: [name], MD;  Location: [place] [facility];  Service: Cardiovascular;  Laterality: N/A;  . PERIPHERAL VASCULAR  CATHETERIZATION N/A 05/19/2016   Procedure: IVC Filter Removal;  Surgeon: [name], MD;  Location: [place] [facility];  Service: Cardiovascular;  Laterality: N/A;  . Rectal Cyst Removal  1954  . TOTAL KNEE ARTHROPLASTY  2006, 2009     Home Meds: Prior to Admission medications   Medication Sig Start Date End Date Taking? Authorizing Provider  acetaminophen (TYLENOL) 325 MG tablet Take 975 mg by mouth 3 (three) times daily.   Yes [provider]  amLODipine (NORVASC) 5 MG tablet Take 5 mg by mouth daily.   Yes [provider]  amoxicillin-clavulanate (AUGMENTIN) 875-125 MG tablet Take 1 tablet by mouth 2 (two) times daily. 01/22/20  Yes Carrie Mew, MD  Calcium Carbonate-Vitamin D (CALCIUM 600+D) 600-400 MG-UNIT tablet Take 2 tablets by mouth 2 (two) times daily.   Yes [provider]  carboxymethylcellulose (REFRESH PLUS) 0.5 % SOLN Place 1 drop into both eyes 4 (four) times daily.    Yes [provider]  clopidogrel (PLAVIX) 75 MG tablet Take 75 mg by mouth daily.   Yes [provider]  dapsone 25 MG tablet Take 25 mg by mouth See admin instructions. Take 1 tablet daily on Sunday, Monday, Wednesday, and Friday.   Yes [provider]  diclofenac Sodium (VOLTAREN) 1 % GEL Apply 2 g topically 2 (two) times daily as needed.   Yes [provider]  ferrous sulfate 325 (65 FE) MG tablet Take 325 mg by mouth daily with breakfast.   Yes [provider]  hydrochlorothiazide (MICROZIDE) 12.5 MG capsule Take 12.5 mg by mouth daily.    Yes [provider]  lisinopril (PRINIVIL,ZESTRIL) 20 MG tablet Take 10 mg by mouth daily.    Yes [provider]  loperamide (IMODIUM A-D) 2 MG tablet Take 2 mg by mouth See admin instructions. Administer 4 mg after loose stool, then 2 mg after each subsequent loose stool up to 8 doses in 24 hours as needed.   Yes [provider]  Multiple Vitamins-Minerals  (PRESERVISION AREDS 2+MULTI VIT PO) Take 1 capsule by mouth 2 (two) times daily.    Yes [provider]  omeprazole (PRILOSEC) 20 MG capsule Take 20 mg by mouth daily.   Yes [provider]  ondansetron (ZOFRAN ODT) 4 MG disintegrating tablet Take 1 tablet (4 mg total) by mouth every 8 (eight) hours as needed for nausea or vomiting. 01/22/20  Yes Carrie Mew, MD  potassium chloride SA (K-DUR,KLOR-CON) 20 MEQ tablet Take 20 mEq by mouth daily.    Yes [provider]  tamsulosin (FLOMAX) 0.4 MG CAPS capsule Take 0.8 mg by mouth daily.    Yes [provider]  Turmeric, Curcuma Longa, (TURMERIC ROOT) POWD Take 500 mg by mouth daily.   Yes [provider]  WHITE PETROLATUM-MINERAL OIL OP Apply 1 application to eye at bedtime.   Yes [provider]  calcium carbonate (CALCIUM 600) 600 MG TABS tablet Take 600 mg by mouth 2 (two) times daily with a meal.    [provider]    Inpatient Medications:  . acetaminophen  975 mg Oral TID  . amLODipine  5 mg Oral Daily  . calcium-vitamin D  2 tablet Oral BID  . clopidogrel  75 mg Oral Daily  . dapsone  25 mg Oral Once per day on Sun Mon Wed Fri  . enoxaparin (LOVENOX) injection  40 mg Subcutaneous Q24H  . ferrous sulfate  325 mg Oral Q breakfast  . hydrochlorothiazide  12.5 mg Oral Daily  . lisinopril  10 mg Oral Daily  . multivitamin-lutein   Oral BID  . pantoprazole  40 mg Oral Daily  . polyvinyl alcohol  1 drop Both Eyes QID  . potassium chloride SA  20 mEq Oral Daily  . sodium chloride flush  3 mL Intravenous Q12H  . tamsulosin  0.8 mg Oral q1800   . sodium chloride    . [START ON 01/27/2020] cefTRIAXone (ROCEPHIN)  IV      Allergies:  Allergies  Allergen Reactions  . Furosemide Rash    Social History   Socioeconomic History  . Marital status: Married    Spouse name: Not on file  . Number of children: Not on file  . Years of education: Not on file  . Highest education  level: Not on file  Occupational History  . Not on file  Tobacco Use  . Smoking status: Former Research scientist (life sciences)  . Smokeless tobacco: Never Used  Substance and Sexual Activity  . Alcohol use: Yes    Alcohol/week: 6.0 standard drinks    Types: 6 Glasses of wine per week  . Drug use: Not on file  . Sexual activity: Not on file  Other Topics Concern  . Not on file  Social History Narrative  . Not on file   Social Determinants of Health   Financial Resource Strain:   . Difficulty of Paying Living Expenses:   Food Insecurity:   . Worried About Charity fundraiser in the Last Year:   .  Ran Out of Food in the Last Year:   Transportation Needs:   . Film/video editor (Medical):   Marland Kitchen Lack of Transportation (Non-Medical):   Physical Activity:   . Days of Exercise per Week:   . Minutes of Exercise per Session:   Stress:   . Feeling of Stress :   Social Connections:   . Frequency of Communication with Friends and Family:   . Frequency of Social Gatherings with Friends and Family:   . Attends Religious Services:   . Active Member of Clubs or Organizations:   . Attends Archivist Meetings:   Marland Kitchen Marital Status:   Intimate Partner Violence:   . Fear of Current or Ex-Partner:   . Emotionally Abused:   Marland Kitchen Physically Abused:   . Sexually Abused:      Family History  Problem Relation Age of Onset  . Prostate cancer Neg Hx   . Bladder Cancer Neg Hx   . Kidney cancer Neg Hx      Review of Systems Positive for shortness of breath weakness Negative for: General:  chills, fever, night sweats or weight changes.  Cardiovascular: PND orthopnea syncope dizziness  Dermatological skin lesions rashes Respiratory: Cough congestion Urologic: Frequent urination urination at night and hematuria Abdominal: negative for nausea, vomiting, diarrhea, bright red blood per rectum, melena, or hematemesis Neurologic: negative for visual changes, and/or hearing changes  All other systems reviewed  and are otherwise negative except as noted above.  Labs: No results for input(s): CKTOTAL, CKMB, TROPONINI in the last 72 hours. Lab Results  Component Value Date   WBC 8.3 01/26/2020   HGB 10.6 (L) 01/26/2020   HCT 31.3 (L) 01/26/2020   MCV 95.1 01/26/2020   PLT 229 01/26/2020    Recent Labs  Lab 01/26/20 0446  NA 141  K 3.9  CL 108  CO2 24  BUN 58*  CREATININE 1.50*  CALCIUM 9.4  PROT 6.9  BILITOT 0.9  ALKPHOS 46  ALT 20  AST 29  GLUCOSE 126*   No results found for: CHOL, HDL, LDLCALC, TRIG No results found for: DDIMER  Radiology/Studies:  DG Ribs Unilateral Right  Result Date: 01/26/2020 CLINICAL DATA:  Right lower back pain after fall today. EXAM: RIGHT RIBS - 2 VIEW COMPARISON:  None. FINDINGS: Possible mildly displaced fracture is seen involving the anterior portion of the right eleventh rib. No pneumothorax or pleural effusion is noted. No other rib abnormality is noted. IMPRESSION: Possible mildly displaced fracture involving the anterior portion of the right eleventh rib. Electronically Signed   By: Marijo Conception M.D.   On: 01/26/2020 08:57   CT Head Wo Contrast  Result Date: 01/26/2020 CLINICAL DATA:  Unwitnessed fall, headache. EXAM: CT HEAD WITHOUT CONTRAST CT CERVICAL SPINE WITHOUT CONTRAST TECHNIQUE: Multidetector CT imaging of the head and cervical spine was performed following the standard protocol without intravenous contrast. Multiplanar CT image reconstructions of the cervical spine were also generated. COMPARISON:  MRI brain 04/05/2012 FINDINGS: CT HEAD FINDINGS Brain: Periventricular white matter and corona radiata hypodensities favor chronic ischemic microvascular white matter disease. Small remote lacunar infarct of the left caudate head. Otherwise, the brainstem, cerebellum, cerebral peduncles, thalamus, basal ganglia, basilar cisterns, and ventricular system appear within normal limits. Chronic intracranial fatty signal along the anterior falx,  considered benign/incidental. Vascular: There is atherosclerotic calcification of the cavernous carotid arteries bilaterally. Mildly ectatic proximal left MCA. Skull: Unremarkable Sinuses/Orbits: Mucosal thickening in the ethmoid air cells compatible with mild chronic sinusitis.  Along the upper margin of the left lateral rectus, a 1.3 by 0.4 by 0.4 cm structure with gas density but internal speckling of higher density is present. This slightly flattens the globe's contour. This is an unusual appearance, but this device appears to have been present back on the MRI of 04/05/2012, and this could represent a micropore glaucoma drainage device (GDD) potentially with some physiologic nitrogen gas accumulation. Given the flattening of the contour, I am highly skeptical that this represents venous gas within a varix. Correlate with patient's ophthalmic history. Other: No supplemental non-categorized findings. CT CERVICAL SPINE FINDINGS Alignment: 1.5 mm degenerative anterolisthesis at C4-5 and C6-7. 1.5 mm degenerative retrolisthesis at C5-6. Skull base and vertebrae: Fused facet joint on the right at C3-4. Prominent loss of intervertebral disc height at C5-6 with questionable partial interbody fusion. Posterior partial interbody fusion at C3-4. Fused facet joint on the right at C7-T1. No cervical spine fracture or acute bony finding. Soft tissues and spinal canal: Bilateral carotid bulb atherosclerotic calcification. Disc levels: Cervical spondylosis contributing to left foraminal impingement at C3-4, C4-5, and C5-6; and right foraminal impingement at C3-4, C4-5, C5-6, and possibly C2-3. Upper chest: Unremarkable Other: No supplemental non-categorized findings. IMPRESSION: 1. No acute intracranial findings or acute cervical spine findings. 2. Periventricular white matter and corona radiata hypodensities favor chronic ischemic microvascular white matter disease. Small remote lacunar infarct of the left caudate head. 3.  Along the upper margin of the left lateral rectus, a 1.3 by 0.4 by 0.4 cm structure with gas density but internal speckling of higher density is present. This is an unusual appearance, but this device appears to been present back on the MRI of 04/05/2012, and this could represent a micropore glaucoma drainage device (GDD) potentially with some physiologic nitrogen gas accumulation. Given the flattening of the contour, I am highly skeptical that this represents venous gas within a varix. Correlate with patient's ophthalmic history. 4. Cervical spondylosis and degenerative disc disease causing multilevel impingement. 5. Atherosclerosis. 6. Mild chronic ethmoid sinusitis. Electronically Signed   By: Van Clines M.D.   On: 01/26/2020 07:02   CT Cervical Spine Wo Contrast  Result Date: 01/26/2020 CLINICAL DATA:  Unwitnessed fall, headache. EXAM: CT HEAD WITHOUT CONTRAST CT CERVICAL SPINE WITHOUT CONTRAST TECHNIQUE: Multidetector CT imaging of the head and cervical spine was performed following the standard protocol without intravenous contrast. Multiplanar CT image reconstructions of the cervical spine were also generated. COMPARISON:  MRI brain 04/05/2012 FINDINGS: CT HEAD FINDINGS Brain: Periventricular white matter and corona radiata hypodensities favor chronic ischemic microvascular white matter disease. Small remote lacunar infarct of the left caudate head. Otherwise, the brainstem, cerebellum, cerebral peduncles, thalamus, basal ganglia, basilar cisterns, and ventricular system appear within normal limits. Chronic intracranial fatty signal along the anterior falx, considered benign/incidental. Vascular: There is atherosclerotic calcification of the cavernous carotid arteries bilaterally. Mildly ectatic proximal left MCA. Skull: Unremarkable Sinuses/Orbits: Mucosal thickening in the ethmoid air cells compatible with mild chronic sinusitis. Along the upper margin of the left lateral rectus, a 1.3 by 0.4 by  0.4 cm structure with gas density but internal speckling of higher density is present. This slightly flattens the globe's contour. This is an unusual appearance, but this device appears to have been present back on the MRI of 04/05/2012, and this could represent a micropore glaucoma drainage device (GDD) potentially with some physiologic nitrogen gas accumulation. Given the flattening of the contour, I am highly skeptical that this represents venous gas within a varix. Correlate  with patient's ophthalmic history. Other: No supplemental non-categorized findings. CT CERVICAL SPINE FINDINGS Alignment: 1.5 mm degenerative anterolisthesis at C4-5 and C6-7. 1.5 mm degenerative retrolisthesis at C5-6. Skull base and vertebrae: Fused facet joint on the right at C3-4. Prominent loss of intervertebral disc height at C5-6 with questionable partial interbody fusion. Posterior partial interbody fusion at C3-4. Fused facet joint on the right at C7-T1. No cervical spine fracture or acute bony finding. Soft tissues and spinal canal: Bilateral carotid bulb atherosclerotic calcification. Disc levels: Cervical spondylosis contributing to left foraminal impingement at C3-4, C4-5, and C5-6; and right foraminal impingement at C3-4, C4-5, C5-6, and possibly C2-3. Upper chest: Unremarkable Other: No supplemental non-categorized findings. IMPRESSION: 1. No acute intracranial findings or acute cervical spine findings. 2. Periventricular white matter and corona radiata hypodensities favor chronic ischemic microvascular white matter disease. Small remote lacunar infarct of the left caudate head. 3. Along the upper margin of the left lateral rectus, a 1.3 by 0.4 by 0.4 cm structure with gas density but internal speckling of higher density is present. This is an unusual appearance, but this device appears to been present back on the MRI of 04/05/2012, and this could represent a micropore glaucoma drainage device (GDD) potentially with some  physiologic nitrogen gas accumulation. Given the flattening of the contour, I am highly skeptical that this represents venous gas within a varix. Correlate with patient's ophthalmic history. 4. Cervical spondylosis and degenerative disc disease causing multilevel impingement. 5. Atherosclerosis. 6. Mild chronic ethmoid sinusitis. Electronically Signed   By: Van Clines M.D.   On: 01/26/2020 07:02   CT ABDOMEN PELVIS W CONTRAST  Result Date: 01/22/2020 CLINICAL DATA:  Abdominal abscess/infection suspected. Diverticulitis suspected. Abdominal pain. Free air on chest x-ray. Hematuria since last night. EXAM: CT ABDOMEN AND PELVIS WITH CONTRAST TECHNIQUE: Multidetector CT imaging of the abdomen and pelvis was performed using the standard protocol following bolus administration of intravenous contrast. CONTRAST:  49mL OMNIPAQUE IOHEXOL 300 MG/ML  SOLN COMPARISON:  Chest x-ray on 01/22/2020, CT of the abdomen and pelvis on 12/11/2015 FINDINGS: Lower chest: There is streaky subsegmental atelectasis at the lung bases. Coronary artery calcifications are present. Heart is mildly enlarged. Hepatobiliary: No free intraperitoneal air. The hepatic flexure of the colon is interposed between the liver and the hemidiaphragm. Gallbladder is present. Small liver cysts are 5 millimeters or smaller. No suspicious liver lesion. Pancreas: Unremarkable. No pancreatic ductal dilatation or surrounding inflammatory changes. Spleen: Normal in size without focal abnormality. Adrenals/Urinary Tract: Adrenal glands are normal in appearance. RIGHT renal cyst is 7.2 x 6.0 centimeters. No intrarenal calculi. No hydronephrosis. Ureters are mildly dilated bilaterally. There is marked thickening of the urinary bladder wall, a new finding since prior study. Along the posterior aspect of the urinary bladder wall, the wall is thickened and irregular, measuring up to 2.2 centimeters. Stomach/Bowel: Small hiatal hernia. Stomach is normal in  appearance otherwise. Small bowel loops are normal in caliber and wall thickness. There is moderate stool burden throughout normal appearing loops of colon. The appendix is well seen and has a normal appearance. Vascular/Lymphatic: There is atherosclerotic calcification of the abdominal aorta not associated with aneurysm. No retroperitoneal or mesenteric adenopathy. Reproductive: Prostate is unremarkable. Other: No ascites.  Anterior abdominal wall is unremarkable. Musculoskeletal: Chronic deformity of the superior and inferior pubic rami bilaterally. Moderate degenerative changes throughout the visualized thoracic and lumbar spine. Vacuum disc phenomenon at multiple levels. IMPRESSION: 1. Marked thickening of the urinary bladder wall, a new finding since prior  study. Along the posterior aspect of the urinary bladder wall, the wall is thickened and irregular, measuring up to 2.2 centimeters. Findings are suspicious for malignancy. Recommend further evaluation with cystoscopy. Is 2. Mild bilateral hydroureter, likely related to the urinary bladder process. 3. RIGHT renal cyst. 4. Small hiatal hernia. 5. Coronary artery disease. 6. Chronic deformity of the superior and inferior pubic rami bilaterally. 7. Moderate stool burden. 8. Degenerative changes in the visualized thoracic and lumbar spine. 9. Aortic Atherosclerosis (ICD10-I70.0). Electronically Signed   By: Nolon Nations M.D.   On: 01/22/2020 13:25   CT Hip Right Wo Contrast  Result Date: 01/26/2020 CLINICAL DATA:  Unwitnessed fall, right hip pain. EXAM: CT OF THE RIGHT HIP WITHOUT CONTRAST TECHNIQUE: Multidetector CT imaging of the right hip was performed according to the standard protocol. Multiplanar CT image reconstructions were also generated. COMPARISON:  Radiograph 01/26/2020 FINDINGS: Bones/Joint/Cartilage Extensive chronic fractures of the pubic bodies and adjacent pubic rami with resultant deformities. Although some of the fragments do not have  corticated margins, the appearance is not substantially changed from 12/11/2015 and accordingly a new acute component is not observed. There is spurring of the right femoral head but without a well-defined right proximal femoral fracture. Ligaments Suboptimally assessed by CT. Muscles and Tendons Thickened right iliotibial band along the hip, although not changed from 01/22/2020, with stable mild overlying subcutaneous edema. Atrophic gluteus medius and gluteus minimus muscles. Soft tissues Stable subtle presacral edema. Right common femoral artery atherosclerotic calcification. IMPRESSION: 1. Extensive chronic fractures of the pubic bodies and adjacent pubic rami with resultant deformities. Although some of the fragments do not have corticated margins, the appearance is not substantially changed from 12/11/2015 and accordingly a new acute component is not observed. 2. No fracture of the acetabulum or right proximal femur is apparent on CT. 3. Thickened right iliotibial band along the hip, although not changed from 01/22/2020, with stable mild overlying subcutaneous edema. 4. Atrophic gluteus medius and gluteus minimus muscles. 5. Right common femoral artery atherosclerotic calcification. Electronically Signed   By: Van Clines M.D.   On: 01/26/2020 07:12   US Venous Img Lower Unilateral Left (DVT)  Result Date: 01/10/2020 CLINICAL DATA:  Left lower extremity pain and edema. History of previous DVT. History of prostate cancer. Evaluate for acute or chronic DVT. EXAM: LEFT LOWER EXTREMITY VENOUS DOPPLER ULTRASOUND TECHNIQUE: Gray-scale sonography with graded compression, as well as color Doppler and duplex ultrasound were performed to evaluate the lower extremity deep venous systems from the level of the common femoral vein and including the common femoral, femoral, profunda femoral, popliteal and calf veins including the posterior tibial, peroneal and gastrocnemius veins when visible. The superficial  great saphenous vein was also interrogated. Spectral Doppler was utilized to evaluate flow at rest and with distal augmentation maneuvers in the common femoral, femoral and popliteal veins. COMPARISON:  Bilateral lower extremity venous Doppler ultrasound-12/27/2015 (negative). FINDINGS: Contralateral Common Femoral Vein: Respiratory phasicity is normal and symmetric with the symptomatic side. No evidence of thrombus. Normal compressibility. Common Femoral Vein: No evidence of thrombus. Normal compressibility, respiratory phasicity and response to augmentation. Saphenofemoral Junction: No evidence of thrombus. Normal compressibility and flow on color Doppler imaging. Profunda Femoral Vein: No evidence of thrombus. Normal compressibility and flow on color Doppler imaging. Femoral Vein: No evidence of thrombus. Normal compressibility, respiratory phasicity and response to augmentation. Popliteal Vein: No evidence of thrombus. Normal compressibility, respiratory phasicity and response to augmentation. Calf Veins: No evidence of thrombus. Normal compressibility and  flow on color Doppler imaging. Superficial Great Saphenous Vein: No evidence of thrombus. Normal compressibility. Venous Reflux:  None. Other Findings: There is a minimal amount of subcutaneous edema at the level of the left calf (image 39). IMPRESSION: No evidence of acute or chronic DVT within the left lower extremity. Electronically Signed   By: Sandi Mariscal M.D.   On: 01/10/2020 16:59   DG Chest Portable 1 View  Result Date: 01/26/2020 CLINICAL DATA:  Chest pain. EXAM: PORTABLE CHEST 1 VIEW COMPARISON:  Chest x-ray 01/22/2020.  CT abdomen 01/22/2020. FINDINGS: Mediastinum hilar structures normal. Heart size stable. Low lung volumes with mild bibasilar subsegmental atelectasis. No pleural effusion or pneumothorax. Prominent skin fold on the left. Degenerative change and scoliosis thoracic spine. Degenerative changes both shoulders. No air noted under the  hemidiaphragms as previously questioned on prior study. IMPRESSION: 1.  Low lung volumes with mild bibasilar subsegmental atelectasis. 2. No air noted under the hemidiaphragms as previously questioned prior chest x-ray of 01/22/2020. Electronically Signed   By: Marcello Moores  Register   On: 01/26/2020 06:08   DG Chest Portable 1 View  Result Date: 01/22/2020 CLINICAL DATA:  Shortness of breath EXAM: PORTABLE CHEST 1 VIEW COMPARISON:  January 01, 2016 FINDINGS: The mediastinal contour is stable. Heart size is enlarged. Mild opacity of left lung base is identified probably due to atelectasis. The right lung is clear. Air is identified beneath the right hemidiaphragm. Degenerative joint changes of bilateral shoulders are identified. IMPRESSION: Probable atelectasis of left lung base. Air beneath the right hemidiaphragm suspicious for free air. Further evaluation with a CT of abdomen and pelvis is recommended. These results were called by telephone at the time of interpretation on 01/22/2020 at 11:34 am to provider PHILLIP STAFFORD , who verbally acknowledged these results. Electronically Signed   By: Abelardo Diesel M.D.   On: 01/22/2020 11:35   DG Hip Unilat W or Wo Pelvis 2-3 Views Right  Result Date: 01/26/2020 CLINICAL DATA:  Fall. EXAM: DG HIP (WITH OR WITHOUT PELVIS) 2-3V RIGHT COMPARISON:  CT 01/22/2020, 12/11/2015. FINDINGS: Degenerative change lumbar spine and both hips. Stable chronic deformity noted of the pubis bilaterally. No acute bony or joint abnormality identified. No evidence of acute fracture or dislocation. Right hip is intact. Peripheral vascular calcification. IMPRESSION: 1. Degenerative changes lumbar spine and both hips. Stable chronic deformity of the pubis bilaterally. 2.  No acute abnormality identified.  Right hip is intact. 3.  Peripheral vascular disease. Electronically Signed   By: Marcello Moores  Register   On: 01/26/2020 06:11    EKG: Normal sinus rhythm with frequent preventricular contractions and  ectopic atrial rhythms at heart rates in the 50s  Weights: Filed Weights   01/26/20 0449  Weight: 99.8 kg     Physical Exam: Blood pressure (!) 128/98, pulse 73, temperature 97.6 F (36.4 C), temperature source Oral, resp. rate 16, height 6' (1.829 m), weight 99.8 kg, SpO2 95 %. Body mass index is 29.84 kg/m. General: Well developed, well nourished, in no acute distress. Head eyes ears nose throat: Normocephalic, atraumatic, sclera non-icteric, no xanthomas, nares are without discharge. No apparent thyromegaly and/or mass  Lungs: Normal respiratory effort.  no wheezes, no rales, no rhonchi.  Heart: Irregular with normal S1 S2. no murmur gallop, no rub, PMI is normal size and placement, carotid upstroke normal without bruit, jugular venous pressure is normal Abdomen: Soft, non-tender, non-distended with normoactive bowel sounds. No hepatomegaly. No rebound/guarding. No obvious abdominal masses. Abdominal aorta is normal size without  bruit Extremities: No edema. no cyanosis, no clubbing, no ulcers  Peripheral : 2+ bilateral upper extremity pulses, 2+ bilateral femoral pulses, 2+ bilateral dorsal pedal pulse Neuro: Alert and oriented. No facial asymmetry. No focal deficit. Moves all extremities spontaneously. Musculoskeletal: Normal muscle tone without kyphosis Psych:  Responds to questions appropriately with a normal affect.    Assessment: 86 year old male with hypertension chronic kidney disease anemia with fall and orthopedic injury with EKG showing sinus bradycardia ectopic atrial rhythm preventricular contraction but no current evidence of atrial fibrillation and no current concerns for heart failure or myocardial infarction  Plan: 1.  Continue observation with telemetry showing whether the patient does have some heart block or symptomatic bradycardia or other rhythm disturbances which could have caused his current fall and orthopedic injury 2.  Echocardiogram for LV systolic  dysfunction valvular heart disease contributing to above 3.  Continuation of hypertension medication management and avoid beta-blocker at this time 4.  Further evaluation after observation of above  Signed, Corey Skains M.D. [facility] 01/26/2020, 5:58 PM

## 2020-01-26 NOTE — Progress Notes (Signed)
Was notified by Telemetry of PAC's . Contacted Rufina Falco, NP to notify her of findings. Will continue to monitor and await any orders.

## 2020-01-27 DIAGNOSIS — N183 Chronic kidney disease, stage 3 unspecified: Secondary | ICD-10-CM | POA: Diagnosis present

## 2020-01-27 DIAGNOSIS — Z20822 Contact with and (suspected) exposure to covid-19: Secondary | ICD-10-CM | POA: Diagnosis present

## 2020-01-27 DIAGNOSIS — W19XXXD Unspecified fall, subsequent encounter: Secondary | ICD-10-CM | POA: Diagnosis present

## 2020-01-27 DIAGNOSIS — Z79899 Other long term (current) drug therapy: Secondary | ICD-10-CM | POA: Diagnosis not present

## 2020-01-27 DIAGNOSIS — M25551 Pain in right hip: Secondary | ICD-10-CM | POA: Diagnosis not present

## 2020-01-27 DIAGNOSIS — I129 Hypertensive chronic kidney disease with stage 1 through stage 4 chronic kidney disease, or unspecified chronic kidney disease: Secondary | ICD-10-CM | POA: Diagnosis present

## 2020-01-27 DIAGNOSIS — S32502G Unspecified fracture of left pubis, subsequent encounter for fracture with delayed healing: Secondary | ICD-10-CM | POA: Diagnosis not present

## 2020-01-27 DIAGNOSIS — S32501G Unspecified fracture of right pubis, subsequent encounter for fracture with delayed healing: Secondary | ICD-10-CM | POA: Diagnosis not present

## 2020-01-27 DIAGNOSIS — W19XXXA Unspecified fall, initial encounter: Secondary | ICD-10-CM | POA: Diagnosis present

## 2020-01-27 DIAGNOSIS — Z96659 Presence of unspecified artificial knee joint: Secondary | ICD-10-CM | POA: Diagnosis present

## 2020-01-27 DIAGNOSIS — Z8546 Personal history of malignant neoplasm of prostate: Secondary | ICD-10-CM | POA: Diagnosis not present

## 2020-01-27 DIAGNOSIS — Z888 Allergy status to other drugs, medicaments and biological substances status: Secondary | ICD-10-CM | POA: Diagnosis not present

## 2020-01-27 DIAGNOSIS — M1990 Unspecified osteoarthritis, unspecified site: Secondary | ICD-10-CM | POA: Diagnosis present

## 2020-01-27 DIAGNOSIS — B9620 Unspecified Escherichia coli [E. coli] as the cause of diseases classified elsewhere: Secondary | ICD-10-CM | POA: Diagnosis present

## 2020-01-27 DIAGNOSIS — I739 Peripheral vascular disease, unspecified: Secondary | ICD-10-CM | POA: Diagnosis present

## 2020-01-27 DIAGNOSIS — I459 Conduction disorder, unspecified: Secondary | ICD-10-CM | POA: Diagnosis present

## 2020-01-27 DIAGNOSIS — Z86711 Personal history of pulmonary embolism: Secondary | ICD-10-CM | POA: Diagnosis not present

## 2020-01-27 DIAGNOSIS — K219 Gastro-esophageal reflux disease without esophagitis: Secondary | ICD-10-CM | POA: Diagnosis present

## 2020-01-27 DIAGNOSIS — N136 Pyonephrosis: Secondary | ICD-10-CM | POA: Diagnosis present

## 2020-01-27 DIAGNOSIS — N390 Urinary tract infection, site not specified: Secondary | ICD-10-CM | POA: Diagnosis present

## 2020-01-27 DIAGNOSIS — N400 Enlarged prostate without lower urinary tract symptoms: Secondary | ICD-10-CM | POA: Diagnosis present

## 2020-01-27 DIAGNOSIS — I4891 Unspecified atrial fibrillation: Secondary | ICD-10-CM | POA: Diagnosis present

## 2020-01-27 DIAGNOSIS — Z7902 Long term (current) use of antithrombotics/antiplatelets: Secondary | ICD-10-CM | POA: Diagnosis not present

## 2020-01-27 DIAGNOSIS — Z87891 Personal history of nicotine dependence: Secondary | ICD-10-CM | POA: Diagnosis not present

## 2020-01-27 LAB — CBC
HCT: 26.7 % — ABNORMAL LOW (ref 39.0–52.0)
Hemoglobin: 9.3 g/dL — ABNORMAL LOW (ref 13.0–17.0)
MCH: 32.4 pg (ref 26.0–34.0)
MCHC: 34.8 g/dL (ref 30.0–36.0)
MCV: 93 fL (ref 80.0–100.0)
Platelets: 207 10*3/uL (ref 150–400)
RBC: 2.87 MIL/uL — ABNORMAL LOW (ref 4.22–5.81)
RDW: 14.8 % (ref 11.5–15.5)
WBC: 6 10*3/uL (ref 4.0–10.5)
nRBC: 0 % (ref 0.0–0.2)

## 2020-01-27 LAB — BASIC METABOLIC PANEL
Anion gap: 6 (ref 5–15)
BUN: 52 mg/dL — ABNORMAL HIGH (ref 8–23)
CO2: 26 mmol/L (ref 22–32)
Calcium: 9.1 mg/dL (ref 8.9–10.3)
Chloride: 106 mmol/L (ref 98–111)
Creatinine, Ser: 1.25 mg/dL — ABNORMAL HIGH (ref 0.61–1.24)
GFR calc Af Amer: 57 mL/min — ABNORMAL LOW (ref >60)
GFR calc non Af Amer: 49 mL/min — ABNORMAL LOW (ref >60)
Glucose, Bld: 111 mg/dL — ABNORMAL HIGH (ref 70–99)
Potassium: 4.4 mmol/L (ref 3.5–5.1)
Sodium: 138 mmol/L (ref 135–145)

## 2020-01-27 MED ORDER — LIDOCAINE 5 % EX PTCH
1.0000 | MEDICATED_PATCH | CUTANEOUS | Status: DC
  Administered 2020-01-27 – 2020-01-29 (×3): 1 via TRANSDERMAL
  Filled 2020-01-27 (×4): qty 1

## 2020-01-27 NOTE — Progress Notes (Signed)
[facility] Cardiology [facility] Encounter Note  Patient: Geoffrey Barber / Admit Date: 01/26/2020 / Date of Encounter: 01/27/2020, 8:46 AM    Subjective: Overall patient feels slightly better than yesterday. Still nonambulatory at this time. Patient does have some pain throughout his hip region after fall which he appears to have had multiple falls in the recent past. The patient has had relatively good hypertension control with lisinopril amlodipine combination. Telemetry and rhythm strips show sinus bradycardia with no evidence of advanced heart block and/or no evidence of atrial fibrillation. Echocardiogram showing overall normal LV systolic function and no evidence of significant valvular heart disease with ejection fraction at 45 to 50%  Review of Systems: Positive for: Bone pain Negative for: Vision change, hearing change, syncope, dizziness, nausea, vomiting,diarrhea, bloody stool, stomach pain, cough, congestion, diaphoresis, urinary frequency, urinary pain,skin lesions, skin rashes Others previously listed  Objective: Telemetry: Sinus bradycardia with preventricular contractions Physical Exam: Blood pressure 122/66, pulse (!) 56, temperature 97.7 F (36.5 C), resp. rate 18, height 6' (1.829 m), weight 99.8 kg, SpO2 96 %. Body mass index is 29.84 kg/m. General: Well developed, well nourished, in no acute distress. Head: Normocephalic, atraumatic, sclera non-icteric, no xanthomas, nares are without discharge. Neck: No apparent masses Lungs: Normal respirations with few wheezes, no rhonchi, no rales , no crackles   Heart: Regular rate and rhythm, normal S1 S2, no murmur, no rub, no gallop, PMI is normal size and placement, carotid upstroke normal without bruit, jugular venous pressure normal Abdomen: Soft, non-tender, non-distended with normoactive bowel sounds. No hepatosplenomegaly. Abdominal aorta is normal size without bruit Extremities: N trace edema, no clubbing, no cyanosis, no  ulcers,  Peripheral: 2+ radial, 2+ femoral, 2+ dorsal pedal pulses Neuro: Alert and oriented. Moves all extremities spontaneously. Psych:  Responds to questions appropriately with a normal affect.   Intake/Output Summary (Last 24 hours) at 01/27/2020 0846 Last data filed at 01/27/2020 0815 Gross per 24 hour  Intake 0 ml  Output 850 ml  Net -850 ml    Inpatient Medications:  . acetaminophen  975 mg Oral TID  . amLODipine  5 mg Oral Daily  . calcium-vitamin D  2 tablet Oral BID  . clopidogrel  75 mg Oral Daily  . dapsone  25 mg Oral Once per day on Sun Mon Wed Fri  . enoxaparin (LOVENOX) injection  40 mg Subcutaneous Q24H  . ferrous sulfate  325 mg Oral Q breakfast  . hydrochlorothiazide  12.5 mg Oral Daily  . lisinopril  10 mg Oral Daily  . multivitamin-lutein   Oral BID  . pantoprazole  40 mg Oral Daily  . polyvinyl alcohol  1 drop Both Eyes QID  . potassium chloride SA  20 mEq Oral Daily  . sodium chloride flush  3 mL Intravenous Q12H  . tamsulosin  0.8 mg Oral q1800   Infusions:  . sodium chloride 250 mL (01/27/20 0833)  . cefTRIAXone (ROCEPHIN)  IV 1 g (01/27/20 0835)    Labs: Recent Labs    01/26/20 0446 01/27/20 0423  NA 141 138  K 3.9 4.4  CL 108 106  CO2 24 26  GLUCOSE 126* 111*  BUN 58* 52*  CREATININE 1.50* 1.25*  CALCIUM 9.4 9.1   Recent Labs    01/26/20 0446  AST 29  ALT 20  ALKPHOS 46  BILITOT 0.9  PROT 6.9  ALBUMIN 4.1   Recent Labs    01/26/20 0446 01/27/20 0423  WBC 8.3 6.0  NEUTROABS 5.7  --  HGB 10.6* 9.3*  HCT 31.3* 26.7*  MCV 95.1 93.0  PLT 229 207   No results for input(s): CKTOTAL, CKMB, TROPONINI in the last 72 hours. Invalid input(s): POCBNP No results for input(s): HGBA1C in the last 72 hours.   Weights: Filed Weights   01/26/20 0449  Weight: 99.8 kg     Radiology/Studies:  DG Ribs Unilateral Right  Result Date: 01/26/2020 CLINICAL DATA:  Right lower back pain after fall today. EXAM: RIGHT RIBS - 2 VIEW  COMPARISON:  None. FINDINGS: Possible mildly displaced fracture is seen involving the anterior portion of the right eleventh rib. No pneumothorax or pleural effusion is noted. No other rib abnormality is noted. IMPRESSION: Possible mildly displaced fracture involving the anterior portion of the right eleventh rib. Electronically Signed   By: Marijo Conception M.D.   On: 01/26/2020 08:57   CT Head Wo Contrast  Result Date: 01/26/2020 CLINICAL DATA:  Unwitnessed fall, headache. EXAM: CT HEAD WITHOUT CONTRAST CT CERVICAL SPINE WITHOUT CONTRAST TECHNIQUE: Multidetector CT imaging of the head and cervical spine was performed following the standard protocol without intravenous contrast. Multiplanar CT image reconstructions of the cervical spine were also generated. COMPARISON:  MRI brain 04/05/2012 FINDINGS: CT HEAD FINDINGS Brain: Periventricular white matter and corona radiata hypodensities favor chronic ischemic microvascular white matter disease. Small remote lacunar infarct of the left caudate head. Otherwise, the brainstem, cerebellum, cerebral peduncles, thalamus, basal ganglia, basilar cisterns, and ventricular system appear within normal limits. Chronic intracranial fatty signal along the anterior falx, considered benign/incidental. Vascular: There is atherosclerotic calcification of the cavernous carotid arteries bilaterally. Mildly ectatic proximal left MCA. Skull: Unremarkable Sinuses/Orbits: Mucosal thickening in the ethmoid air cells compatible with mild chronic sinusitis. Along the upper margin of the left lateral rectus, a 1.3 by 0.4 by 0.4 cm structure with gas density but internal speckling of higher density is present. This slightly flattens the globe's contour. This is an unusual appearance, but this device appears to have been present back on the MRI of 04/05/2012, and this could represent a micropore glaucoma drainage device (GDD) potentially with some physiologic nitrogen gas accumulation. Given  the flattening of the contour, I am highly skeptical that this represents venous gas within a varix. Correlate with patient's ophthalmic history. Other: No supplemental non-categorized findings. CT CERVICAL SPINE FINDINGS Alignment: 1.5 mm degenerative anterolisthesis at C4-5 and C6-7. 1.5 mm degenerative retrolisthesis at C5-6. Skull base and vertebrae: Fused facet joint on the right at C3-4. Prominent loss of intervertebral disc height at C5-6 with questionable partial interbody fusion. Posterior partial interbody fusion at C3-4. Fused facet joint on the right at C7-T1. No cervical spine fracture or acute bony finding. Soft tissues and spinal canal: Bilateral carotid bulb atherosclerotic calcification. Disc levels: Cervical spondylosis contributing to left foraminal impingement at C3-4, C4-5, and C5-6; and right foraminal impingement at C3-4, C4-5, C5-6, and possibly C2-3. Upper chest: Unremarkable Other: No supplemental non-categorized findings. IMPRESSION: 1. No acute intracranial findings or acute cervical spine findings. 2. Periventricular white matter and corona radiata hypodensities favor chronic ischemic microvascular white matter disease. Small remote lacunar infarct of the left caudate head. 3. Along the upper margin of the left lateral rectus, a 1.3 by 0.4 by 0.4 cm structure with gas density but internal speckling of higher density is present. This is an unusual appearance, but this device appears to been present back on the MRI of 04/05/2012, and this could represent a micropore glaucoma drainage device (GDD) potentially with some physiologic nitrogen  gas accumulation. Given the flattening of the contour, I am highly skeptical that this represents venous gas within a varix. Correlate with patient's ophthalmic history. 4. Cervical spondylosis and degenerative disc disease causing multilevel impingement. 5. Atherosclerosis. 6. Mild chronic ethmoid sinusitis. Electronically Signed   By: Van Clines  M.D.   On: 01/26/2020 07:02   CT Cervical Spine Wo Contrast  Result Date: 01/26/2020 CLINICAL DATA:  Unwitnessed fall, headache. EXAM: CT HEAD WITHOUT CONTRAST CT CERVICAL SPINE WITHOUT CONTRAST TECHNIQUE: Multidetector CT imaging of the head and cervical spine was performed following the standard protocol without intravenous contrast. Multiplanar CT image reconstructions of the cervical spine were also generated. COMPARISON:  MRI brain 04/05/2012 FINDINGS: CT HEAD FINDINGS Brain: Periventricular white matter and corona radiata hypodensities favor chronic ischemic microvascular white matter disease. Small remote lacunar infarct of the left caudate head. Otherwise, the brainstem, cerebellum, cerebral peduncles, thalamus, basal ganglia, basilar cisterns, and ventricular system appear within normal limits. Chronic intracranial fatty signal along the anterior falx, considered benign/incidental. Vascular: There is atherosclerotic calcification of the cavernous carotid arteries bilaterally. Mildly ectatic proximal left MCA. Skull: Unremarkable Sinuses/Orbits: Mucosal thickening in the ethmoid air cells compatible with mild chronic sinusitis. Along the upper margin of the left lateral rectus, a 1.3 by 0.4 by 0.4 cm structure with gas density but internal speckling of higher density is present. This slightly flattens the globe's contour. This is an unusual appearance, but this device appears to have been present back on the MRI of 04/05/2012, and this could represent a micropore glaucoma drainage device (GDD) potentially with some physiologic nitrogen gas accumulation. Given the flattening of the contour, I am highly skeptical that this represents venous gas within a varix. Correlate with patient's ophthalmic history. Other: No supplemental non-categorized findings. CT CERVICAL SPINE FINDINGS Alignment: 1.5 mm degenerative anterolisthesis at C4-5 and C6-7. 1.5 mm degenerative retrolisthesis at C5-6. Skull base and  vertebrae: Fused facet joint on the right at C3-4. Prominent loss of intervertebral disc height at C5-6 with questionable partial interbody fusion. Posterior partial interbody fusion at C3-4. Fused facet joint on the right at C7-T1. No cervical spine fracture or acute bony finding. Soft tissues and spinal canal: Bilateral carotid bulb atherosclerotic calcification. Disc levels: Cervical spondylosis contributing to left foraminal impingement at C3-4, C4-5, and C5-6; and right foraminal impingement at C3-4, C4-5, C5-6, and possibly C2-3. Upper chest: Unremarkable Other: No supplemental non-categorized findings. IMPRESSION: 1. No acute intracranial findings or acute cervical spine findings. 2. Periventricular white matter and corona radiata hypodensities favor chronic ischemic microvascular white matter disease. Small remote lacunar infarct of the left caudate head. 3. Along the upper margin of the left lateral rectus, a 1.3 by 0.4 by 0.4 cm structure with gas density but internal speckling of higher density is present. This is an unusual appearance, but this device appears to been present back on the MRI of 04/05/2012, and this could represent a micropore glaucoma drainage device (GDD) potentially with some physiologic nitrogen gas accumulation. Given the flattening of the contour, I am highly skeptical that this represents venous gas within a varix. Correlate with patient's ophthalmic history. 4. Cervical spondylosis and degenerative disc disease causing multilevel impingement. 5. Atherosclerosis. 6. Mild chronic ethmoid sinusitis. Electronically Signed   By: Van Clines M.D.   On: 01/26/2020 07:02   CT ABDOMEN PELVIS W CONTRAST  Result Date: 01/22/2020 CLINICAL DATA:  Abdominal abscess/infection suspected. Diverticulitis suspected. Abdominal pain. Free air on chest x-ray. Hematuria since last night. EXAM: CT ABDOMEN  AND PELVIS WITH CONTRAST TECHNIQUE: Multidetector CT imaging of the abdomen and pelvis was  performed using the standard protocol following bolus administration of intravenous contrast. CONTRAST:  74mL OMNIPAQUE IOHEXOL 300 MG/ML  SOLN COMPARISON:  Chest x-ray on 01/22/2020, CT of the abdomen and pelvis on 12/11/2015 FINDINGS: Lower chest: There is streaky subsegmental atelectasis at the lung bases. Coronary artery calcifications are present. Heart is mildly enlarged. Hepatobiliary: No free intraperitoneal air. The hepatic flexure of the colon is interposed between the liver and the hemidiaphragm. Gallbladder is present. Small liver cysts are 5 millimeters or smaller. No suspicious liver lesion. Pancreas: Unremarkable. No pancreatic ductal dilatation or surrounding inflammatory changes. Spleen: Normal in size without focal abnormality. Adrenals/Urinary Tract: Adrenal glands are normal in appearance. RIGHT renal cyst is 7.2 x 6.0 centimeters. No intrarenal calculi. No hydronephrosis. Ureters are mildly dilated bilaterally. There is marked thickening of the urinary bladder wall, a new finding since prior study. Along the posterior aspect of the urinary bladder wall, the wall is thickened and irregular, measuring up to 2.2 centimeters. Stomach/Bowel: Small hiatal hernia. Stomach is normal in appearance otherwise. Small bowel loops are normal in caliber and wall thickness. There is moderate stool burden throughout normal appearing loops of colon. The appendix is well seen and has a normal appearance. Vascular/Lymphatic: There is atherosclerotic calcification of the abdominal aorta not associated with aneurysm. No retroperitoneal or mesenteric adenopathy. Reproductive: Prostate is unremarkable. Other: No ascites.  Anterior abdominal wall is unremarkable. Musculoskeletal: Chronic deformity of the superior and inferior pubic rami bilaterally. Moderate degenerative changes throughout the visualized thoracic and lumbar spine. Vacuum disc phenomenon at multiple levels. IMPRESSION: 1. Marked thickening of the urinary  bladder wall, a new finding since prior study. Along the posterior aspect of the urinary bladder wall, the wall is thickened and irregular, measuring up to 2.2 centimeters. Findings are suspicious for malignancy. Recommend further evaluation with cystoscopy. Is 2. Mild bilateral hydroureter, likely related to the urinary bladder process. 3. RIGHT renal cyst. 4. Small hiatal hernia. 5. Coronary artery disease. 6. Chronic deformity of the superior and inferior pubic rami bilaterally. 7. Moderate stool burden. 8. Degenerative changes in the visualized thoracic and lumbar spine. 9. Aortic Atherosclerosis (ICD10-I70.0). Electronically Signed   By: Nolon Nations M.D.   On: 01/22/2020 13:25   CT Hip Right Wo Contrast  Result Date: 01/26/2020 CLINICAL DATA:  Unwitnessed fall, right hip pain. EXAM: CT OF THE RIGHT HIP WITHOUT CONTRAST TECHNIQUE: Multidetector CT imaging of the right hip was performed according to the standard protocol. Multiplanar CT image reconstructions were also generated. COMPARISON:  Radiograph 01/26/2020 FINDINGS: Bones/Joint/Cartilage Extensive chronic fractures of the pubic bodies and adjacent pubic rami with resultant deformities. Although some of the fragments do not have corticated margins, the appearance is not substantially changed from 12/11/2015 and accordingly a new acute component is not observed. There is spurring of the right femoral head but without a well-defined right proximal femoral fracture. Ligaments Suboptimally assessed by CT. Muscles and Tendons Thickened right iliotibial band along the hip, although not changed from 01/22/2020, with stable mild overlying subcutaneous edema. Atrophic gluteus medius and gluteus minimus muscles. Soft tissues Stable subtle presacral edema. Right common femoral artery atherosclerotic calcification. IMPRESSION: 1. Extensive chronic fractures of the pubic bodies and adjacent pubic rami with resultant deformities. Although some of the fragments do  not have corticated margins, the appearance is not substantially changed from 12/11/2015 and accordingly a new acute component is not observed. 2. No fracture of the  acetabulum or right proximal femur is apparent on CT. 3. Thickened right iliotibial band along the hip, although not changed from 01/22/2020, with stable mild overlying subcutaneous edema. 4. Atrophic gluteus medius and gluteus minimus muscles. 5. Right common femoral artery atherosclerotic calcification. Electronically Signed   By: Van Clines M.D.   On: 01/26/2020 07:12   US Venous Img Lower Unilateral Left (DVT)  Result Date: 01/10/2020 CLINICAL DATA:  Left lower extremity pain and edema. History of previous DVT. History of prostate cancer. Evaluate for acute or chronic DVT. EXAM: LEFT LOWER EXTREMITY VENOUS DOPPLER ULTRASOUND TECHNIQUE: Gray-scale sonography with graded compression, as well as color Doppler and duplex ultrasound were performed to evaluate the lower extremity deep venous systems from the level of the common femoral vein and including the common femoral, femoral, profunda femoral, popliteal and calf veins including the posterior tibial, peroneal and gastrocnemius veins when visible. The superficial great saphenous vein was also interrogated. Spectral Doppler was utilized to evaluate flow at rest and with distal augmentation maneuvers in the common femoral, femoral and popliteal veins. COMPARISON:  Bilateral lower extremity venous Doppler ultrasound-12/27/2015 (negative). FINDINGS: Contralateral Common Femoral Vein: Respiratory phasicity is normal and symmetric with the symptomatic side. No evidence of thrombus. Normal compressibility. Common Femoral Vein: No evidence of thrombus. Normal compressibility, respiratory phasicity and response to augmentation. Saphenofemoral Junction: No evidence of thrombus. Normal compressibility and flow on color Doppler imaging. Profunda Femoral Vein: No evidence of thrombus. Normal  compressibility and flow on color Doppler imaging. Femoral Vein: No evidence of thrombus. Normal compressibility, respiratory phasicity and response to augmentation. Popliteal Vein: No evidence of thrombus. Normal compressibility, respiratory phasicity and response to augmentation. Calf Veins: No evidence of thrombus. Normal compressibility and flow on color Doppler imaging. Superficial Great Saphenous Vein: No evidence of thrombus. Normal compressibility. Venous Reflux:  None. Other Findings: There is a minimal amount of subcutaneous edema at the level of the left calf (image 39). IMPRESSION: No evidence of acute or chronic DVT within the left lower extremity. Electronically Signed   By: Sandi Mariscal M.D.   On: 01/10/2020 16:59   DG Chest Portable 1 View  Result Date: 01/26/2020 CLINICAL DATA:  Chest pain. EXAM: PORTABLE CHEST 1 VIEW COMPARISON:  Chest x-ray 01/22/2020.  CT abdomen 01/22/2020. FINDINGS: Mediastinum hilar structures normal. Heart size stable. Low lung volumes with mild bibasilar subsegmental atelectasis. No pleural effusion or pneumothorax. Prominent skin fold on the left. Degenerative change and scoliosis thoracic spine. Degenerative changes both shoulders. No air noted under the hemidiaphragms as previously questioned on prior study. IMPRESSION: 1.  Low lung volumes with mild bibasilar subsegmental atelectasis. 2. No air noted under the hemidiaphragms as previously questioned prior chest x-ray of 01/22/2020. Electronically Signed   By: Marcello Moores  Register   On: 01/26/2020 06:08   DG Chest Portable 1 View  Result Date: 01/22/2020 CLINICAL DATA:  Shortness of breath EXAM: PORTABLE CHEST 1 VIEW COMPARISON:  January 01, 2016 FINDINGS: The mediastinal contour is stable. Heart size is enlarged. Mild opacity of left lung base is identified probably due to atelectasis. The right lung is clear. Air is identified beneath the right hemidiaphragm. Degenerative joint changes of bilateral shoulders are  identified. IMPRESSION: Probable atelectasis of left lung base. Air beneath the right hemidiaphragm suspicious for free air. Further evaluation with a CT of abdomen and pelvis is recommended. These results were called by telephone at the time of interpretation on 01/22/2020 at 11:34 am to provider PHILLIP STAFFORD , who verbally acknowledged  these results. Electronically Signed   By: Abelardo Diesel M.D.   On: 01/22/2020 11:35   ECHOCARDIOGRAM COMPLETE  Result Date: 01/26/2020    ECHOCARDIOGRAM REPORT   Patient Name:   PUNEET MASONER Date of Exam: 01/26/2020 Medical Rec #:  [id number]       Height:       72.0 in Accession #:    [id number]      Weight:       220.0 lb Date of Birth:  30-Dec-1924       BSA:          2.219 m Patient Age:    86 years        BP:           128/98 mmHg Patient Gender: M               HR:           73 bpm. Exam Location:  [facility] Procedure: 2D Echo, Cardiac Doppler and Color Doppler Indications:     Atrial Fibrillation 427.31  History:         Patient has prior history of Echocardiogram examinations, most                  recent 12/27/2015. Risk Factors:Hypertension. Pulmonary embolism,                  pneumonia.  Sonographer:     Sherrie Sport RDCS (AE) Referring Phys:  [place] Diagnosing Phys: Serafina Royals MD IMPRESSIONS  1. Left ventricular ejection fraction, by estimation, is 40 to 45%. The left ventricle has mildly decreased function. The left ventricle demonstrates global hypokinesis. Left ventricular diastolic parameters were normal.  2. Right ventricular systolic function is normal. The right ventricular size is normal. There is normal pulmonary artery systolic pressure.  3. Left atrial size was mildly dilated.  4. The mitral valve is normal in structure. Mild mitral valve regurgitation.  5. The aortic valve is normal in structure. Aortic valve regurgitation is not visualized. FINDINGS  Left Ventricle: Left ventricular ejection fraction, by estimation, is 40 to 45%. The left  ventricle has mildly decreased function. The left ventricle demonstrates global hypokinesis. The left ventricular internal cavity size was normal in size. There is  no left ventricular hypertrophy. Left ventricular diastolic parameters were normal. Right Ventricle: The right ventricular size is normal. No increase in right ventricular wall thickness. Right ventricular systolic function is normal. There is normal pulmonary artery systolic pressure. The tricuspid regurgitant velocity is 1.59 m/s, and  with an assumed right atrial pressure of 10 mmHg, the estimated right ventricular systolic pressure is 61.4 mmHg. Left Atrium: Left atrial size was mildly dilated. Right Atrium: Right atrial size was normal in size. Pericardium: There is no evidence of pericardial effusion. Mitral Valve: The mitral valve is normal in structure. Mild mitral valve regurgitation. Tricuspid Valve: The tricuspid valve is normal in structure. Tricuspid valve regurgitation is mild. Aortic Valve: The aortic valve is normal in structure. Aortic valve regurgitation is not visualized. Aortic valve mean gradient measures 2.0 mmHg. Aortic valve peak gradient measures 3.4 mmHg. Aortic valve area, by VTI measures 3.79 cm. Pulmonic Valve: The pulmonic valve was normal in structure. Pulmonic valve regurgitation is not visualized. Aorta: The aortic root and ascending aorta are structurally normal, with no evidence of dilitation. IAS/Shunts: No atrial level shunt detected by color flow Doppler.  LEFT VENTRICLE PLAX 2D LVIDd:  3.46 cm  Diastology LVIDs:         2.39 cm  LV e' lateral:   8.92 cm/s LV PW:         0.98 cm  LV E/e' lateral: 6.5 LV IVS:        1.33 cm  LV e' medial:    8.05 cm/s LVOT diam:     2.20 cm  LV E/e' medial:  7.2 LV SV:         75 LV SV Index:   34 LVOT Area:     3.80 cm  RIGHT VENTRICLE RV Basal diam:  3.94 cm RV S prime:     14.80 cm/s TAPSE (M-mode): 3.4 cm LEFT ATRIUM             Index       RIGHT ATRIUM           Index LA  diam:        3.90 cm 1.76 cm/m  RA Area:     21.80 cm LA Vol (A2C):   59.0 ml 26.59 ml/m RA Volume:   65.80 ml  29.66 ml/m LA Vol (A4C):   35.9 ml 16.18 ml/m LA Biplane Vol: 44.9 ml 20.24 ml/m  AORTIC VALVE                   PULMONIC VALVE AV Area (Vmax):    3.73 cm    PV Vmax:       0.79 m/s AV Area (Vmean):   3.05 cm    PV Peak grad:  2.5 mmHg AV Area (VTI):     3.79 cm AV Vmax:           92.55 cm/s AV Vmean:          61.750 cm/s AV VTI:            0.198 m AV Peak Grad:      3.4 mmHg AV Mean Grad:      2.0 mmHg LVOT Vmax:         90.90 cm/s LVOT Vmean:        49.500 cm/s LVOT VTI:          0.197 m LVOT/AV VTI ratio: 1.00  AORTA Ao Root diam: 3.40 cm MITRAL VALVE               TRICUSPID VALVE MV Area (PHT): 2.02 cm    TR Peak grad:   10.1 mmHg MV Decel Time: 375 msec    TR Vmax:        159.00 cm/s MV E velocity: 57.70 cm/s MV A velocity: 99.40 cm/s  SHUNTS MV E/A ratio:  0.58        Systemic VTI:  0.20 m                            Systemic Diam: 2.20 cm Serafina Royals MD Electronically signed by Serafina Royals MD Signature Date/Time: 01/26/2020/7:07:30 PM    Final    DG Hip Unilat W or Wo Pelvis 2-3 Views Right  Result Date: 01/26/2020 CLINICAL DATA:  Fall. EXAM: DG HIP (WITH OR WITHOUT PELVIS) 2-3V RIGHT COMPARISON:  CT 01/22/2020, 12/11/2015. FINDINGS: Degenerative change lumbar spine and both hips. Stable chronic deformity noted of the pubis bilaterally. No acute bony or joint abnormality identified. No evidence of acute fracture or dislocation. Right hip is intact. Peripheral vascular calcification. IMPRESSION: 1. Degenerative changes lumbar spine and both hips.  Stable chronic deformity of the pubis bilaterally. 2.  No acute abnormality identified.  Right hip is intact. 3.  Peripheral vascular disease. Electronically Signed   By: Marcello Moores  Register   On: 01/26/2020 06:11     Assessment and Recommendation  86 y.o. male with known chronic kidney disease anemia and hypertension with recent continued  episodes of falling and injury with EKG showing sinus bradycardia ectopic atrial rhythm and preventricular contractions but no evidence of atrial fibrillation or advanced heart block 1. Continue supportive care for fall and age and weakness with physical and occupational rehabilitation 2. No further cardiac diagnostics necessary at this time 3. Avoid beta-blocker or other medications that can cause bradycardia 4. Continue telemetry while patient is ambulating to assess for any further heart block or possible other causes from the cardiac standpoint for falls 5. Okay for discharge to home from cardiac standpoint if ambulating well with follow-up evaluation and possible of further monitoring as outpatient 6. Call if further questions or need for assistance otherwise will assume patient is being discharged  Signed, Serafina Royals M.D. FACC

## 2020-01-27 NOTE — Plan of Care (Signed)
  Problem: Education: Goal: Knowledge of General Education information will improve Description Including pain rating scale, medication(s)/side effects and non-pharmacologic comfort measures Outcome: Progressing   

## 2020-01-27 NOTE — Evaluation (Signed)
Physical Therapy Evaluation Patient Details Name: Geoffrey Barber MRN: [id number] DOB: 12/02/1924 Today's Date: 01/27/2020   History of Present Illness  presented to ER and admitted for acute hospitalization s/p to mechanical fall in home environment with acute set of R hip pain. Imaging negative for acute R hip orthopedic injury; does indicate extensive chronic fractures to pelvis and R 11th rib fracture.  Clinical Impression  Upon evaluation, patient alert and oriented; follows commands and puts forth good effort with mobility assessment.  Exceptionally HOH; appears to hear best from L ear (and some element of lip reading).  Patient globally weak and deconditioned throughout all extremities; mild pain reported to R hip.  Do note extensive ecchymosis and abrasion to R hip/low back.  Currently requiring extensive physical assist for all functional activities-mod/max assist for bed mobility; mod assist +1 with RW from elevated bed surface; mod assist +1 with RW for 1-2 steps forward/backward/lateral edge of bed.  Demonstrates forward flexed posture with mod WBing bilat UEs, intermittently bracing posterior surface of bilat LEs for balance/stability; able to take 1-2 steps forward/backward/laterally.  Poor balance, poor overall activity tolerance.  Limited reports of R hip pain with WBing; however, fatigues quickly and unable to tolerate additional gait/mobility at this time. Would benefit from skilled PT to address above deficits and promote optimal return to PLOF.; recommend transition to STR upon discharge from acute hospitalization.     Follow Up Recommendations SNF    Equipment Recommendations       Recommendations for Other Services       Precautions / Restrictions Precautions Precautions: Fall Restrictions Weight Bearing Restrictions: No      Mobility  Bed Mobility Overal bed mobility: Needs Assistance Bed Mobility: Supine to Sit;Sit to Supine     Supine to sit: Mod assist Sit  to supine: Mod assist;Max assist   General bed mobility comments: assist for LE management, truncal elevation  Transfers Overall transfer level: Needs assistance Equipment used: Rolling walker (2 wheeled) Transfers: Sit to/from Stand Sit to Stand: Mod assist         General transfer comment: requires elevated seated surface, therapist blocking LEs and UE support to complete lift off.  Very effortful and exaggerated.  Unsafe to attempt without RW and extensive +1 assist  Ambulation/Gait Ambulation/Gait assistance: Mod assist Gait Distance (Feet): 2 Feet Assistive device: Rolling walker (2 wheeled)       General Gait Details: forward flexed posture with mod WBing bilat UEs, intermittently bracing posterior surface of bilat LEs for balance/stability; able to take 1-2 steps forward/backward/laterally.  Poor balance, poor overall activity tolerance.  Limited reports of R hip pain with WBing.  Stairs            Wheelchair Mobility    Modified Rankin (Stroke Patients Only)       Balance Overall balance assessment: Needs assistance Sitting-balance support: No upper extremity supported;Feet supported Sitting balance-Leahy Scale: Good     Standing balance support: Bilateral upper extremity supported Standing balance-Leahy Scale: Poor                               Pertinent Vitals/Pain Pain Assessment: Faces Faces Pain Scale: Hurts little more Pain Location: r hip Pain Descriptors / Indicators: Aching;Guarding;Grimacing Pain Intervention(s): Limited activity within patient's tolerance;Monitored during session;Repositioned    Home Living Family/patient expects to be discharged to:: Skilled nursing facility  Additional Comments: Resident of ALF section of [facility]    Prior Function Level of Independence: Needs assistance         Comments: Mod indep with in-room mobility and toileting with RW; staff brings meals to  room.  does endorse multiple fall history, unable to quantify.     Hand Dominance        Extremity/Trunk Assessment   Upper Extremity Assessment Upper Extremity Assessment: Overall WFL for tasks assessed (grossly 4/5 throughout)    Lower Extremity Assessment Lower Extremity Assessment: Generalized weakness (grossly 3-/5 throughout throughout)       Communication   Communication: HOH  Cognition Arousal/Alertness: Awake/alert Behavior During Therapy: WFL for tasks assessed/performed Overall Cognitive Status: Within Functional Limits for tasks assessed                                        General Comments      Exercises Other Exercises Other Exercises: Rolling bilat, mod assist +1-2; heavy use of bedrails.  Difficulty with full rotation of pelvic, mild pain reported with rolling onto R. Very fearful of falling.  Dep +2 for hygiene, linen change after incontinent bladder   Assessment/Plan    PT Assessment Patient needs continued PT services  PT Problem List Decreased strength;Decreased range of motion;Decreased activity tolerance;Decreased balance;Decreased mobility;Decreased knowledge of use of DME;Decreased safety awareness;Decreased knowledge of precautions;Decreased skin integrity;Pain;Cardiopulmonary status limiting activity       PT Treatment Interventions Gait training;Functional mobility training;Therapeutic activities;Balance training;DME instruction;Therapeutic exercise;Cognitive remediation;Patient/family education    PT Goals (Current goals can be found in the Care Plan section)  Acute Rehab PT Goals Patient Stated Goal: " to try" PT Goal Formulation: With patient Time For Goal Achievement: 02/10/20 Potential to Achieve Goals: Fair    Frequency Min 2X/week   Barriers to discharge Decreased caregiver support      Co-evaluation               AM-PAC PT "6 Clicks" Mobility  Outcome Measure Help needed turning from your back to  your side while in a flat bed without using bedrails?: A Lot Help needed moving from lying on your back to sitting on the side of a flat bed without using bedrails?: A Lot Help needed moving to and from a bed to a chair (including a wheelchair)?: A Lot Help needed standing up from a chair using your arms (e.g., wheelchair or bedside chair)?: A Lot Help needed to walk in hospital room?: Total Help needed climbing 3-5 steps with a railing? : Total 6 Click Score: 10    End of Session Equipment Utilized During Treatment: Gait belt Activity Tolerance: Patient tolerated treatment well Patient left: in bed;with call bell/phone within reach;with bed alarm set Nurse Communication: Mobility status PT Visit Diagnosis: Difficulty in walking, not elsewhere classified (R26.2);Muscle weakness (generalized) (M62.81)    Time: 8786-7672 PT Time Calculation (min) (ACUTE ONLY): 32 min   Charges:   PT Evaluation $PT Eval Moderate Complexity: 1 Mod PT Treatments $Therapeutic Activity: 8-22 mins        Ngozi Alvidrez H. Owens Shark, PT, DPT, NCS 01/27/20, 4:31 PM [phone]

## 2020-01-27 NOTE — Progress Notes (Addendum)
PROGRESS NOTE    Geoffrey Barber  PYP:[id number] DOB: 11/28/1924 DOA: 01/26/2020 PCP: Derinda Late, MD    Brief Narrative:  Geoffrey Barber is a 86 y.o. male with medical history significant for prostate cancer, hypertension, GERD and BPH who presents to the emergency room via EMS after an unwitnessed fall.   Right hip x-ray showed degenerative changes lumbar spine and both hips. Stable chronic deformity of the pubis bilaterally. No acute abnormality identified. Right hip is intact. Peripheral vascular disease. He had a CT scan of the right hip which showed  extensive chronic fractures of the pubic bodies and adjacent pubic rami with resultant deformities. Although some of the fragments do not have corticated margins, the appearance is not substantially changed from 12/11/2015 and accordingly a new acute component is not Observed. There is spurring of the right femoral head but without a well-defined right proximal femoral fracture. Per admitter ekg with afib with pvc's however per cards review EKG showing sinus bradycardia ectopic atrial rhythm and preventricular contractions but no evidence of atrial fibrillation or advanced heart block   Consultants:   cardiology  Procedures:   Antimicrobials:       Subjective: Lying in bed. No complaints. Tells me he has difficulty hearing. Denies cp, sob, dizziness, or any other sx  Objective: Vitals:   01/26/20 2228 01/27/20 0223 01/27/20 0724 01/27/20 0726  BP: 113/71 140/74 (!) 122/103 122/66  Pulse: (!) 56 68  (!) 56  Resp: 20 18  18   Temp: 97.6 F (36.4 C) 97.7 F (36.5 C) 97.7 F (36.5 C) 97.7 F (36.5 C)  TempSrc: Oral Oral Oral   SpO2: 98% 94%  96%  Weight:      Height:        Intake/Output Summary (Last 24 hours) at 01/27/2020 1447 Last data filed at 01/27/2020 1345 Gross per 24 hour  Intake 240 ml  Output 1050 ml  Net -810 ml   Filed Weights   01/26/20 0449  Weight: 99.8 kg    Examination:  General exam:  Appears calm and comfortable , nad Respiratory system: Clear to auscultation. Respiratory effort normal. Cardiovascular system: S1 & S2 heard, regular/irregular no JVD, murmurs, rubs, gallops or clicks.  Gastrointestinal system: Abdomen is nondistended, soft and nontender. Normal bowel sounds heard. Central nervous system: Alert and oriented x2, not to date. Grossly intact Extremities: No edema Skin: Warm dry Psychiatry: Judgement and insight appear normal. Mood & affect appropriate in current setting.     Data Reviewed: I have personally reviewed following labs and imaging studies  CBC: Recent Labs  Lab 01/22/20 1020 01/26/20 0446 01/27/20 0423  WBC 5.5 8.3 6.0  NEUTROABS 2.9 5.7  --   HGB 11.8* 10.6* 9.3*  HCT 34.1* 31.3* 26.7*  MCV 92.9 95.1 93.0  PLT 231 229 245   Basic Metabolic Panel: Recent Labs  Lab 01/22/20 1020 01/26/20 0446 01/27/20 0423  NA 139 141 138  K 4.1 3.9 4.4  CL 107 108 106  CO2 23 24 26   GLUCOSE 124* 126* 111*  BUN 46* 58* 52*  CREATININE 1.42* 1.50* 1.25*  CALCIUM 9.0 9.4 9.1   GFR: Estimated Creatinine Clearance: 45.2 mL/min (A) (by C-G formula based on SCr of 1.25 mg/dL (H)). Liver Function Tests: Recent Labs  Lab 01/26/20 0446  AST 29  ALT 20  ALKPHOS 46  BILITOT 0.9  PROT 6.9  ALBUMIN 4.1   No results for input(s): LIPASE, AMYLASE in the last 168 hours. No results for  input(s): AMMONIA in the last 168 hours. Coagulation Profile: No results for input(s): INR, PROTIME in the last 168 hours. Cardiac Enzymes: No results for input(s): CKTOTAL, CKMB, CKMBINDEX, TROPONINI in the last 168 hours. BNP (last 3 results) No results for input(s): PROBNP in the last 8760 hours. HbA1C: No results for input(s): HGBA1C in the last 72 hours. CBG: No results for input(s): GLUCAP in the last 168 hours. Lipid Profile: No results for input(s): CHOL, HDL, LDLCALC, TRIG, CHOLHDL, LDLDIRECT in the last 72 hours. Thyroid Function Tests: Recent Labs     01/26/20 0446  TSH 5.920*   Anemia Panel: No results for input(s): VITAMINB12, FOLATE, FERRITIN, TIBC, IRON, RETICCTPCT in the last 72 hours. Sepsis Labs: No results for input(s): PROCALCITON, LATICACIDVEN in the last 168 hours.  Recent Results (from the past 240 hour(s))  Urine culture     Status: Abnormal   Collection Time: 01/22/20 10:43 AM   Specimen: Urine, Random  Result Value Ref Range Status   Specimen Description   Final    URINE, RANDOM Performed at [facility], [address] [place], [place] [address]    Special Requests   Final    NONE Performed at [facility], [place], [place] [address]    Culture >=100,000 COLONIES/mL ESCHERICHIA COLI (A)  Final   Report Status 01/25/2020 FINAL  Final   Organism ID, Bacteria ESCHERICHIA COLI (A)  Final      Susceptibility   Escherichia coli - MIC*    AMPICILLIN 4 SENSITIVE Sensitive     CEFAZOLIN <=4 SENSITIVE Sensitive     CEFTRIAXONE <=0.25 SENSITIVE Sensitive     CIPROFLOXACIN <=0.25 SENSITIVE Sensitive     GENTAMICIN <=1 SENSITIVE Sensitive     IMIPENEM <=0.25 SENSITIVE Sensitive     NITROFURANTOIN <=16 SENSITIVE Sensitive     TRIMETH/SULFA <=20 SENSITIVE Sensitive     AMPICILLIN/SULBACTAM <=2 SENSITIVE Sensitive     PIP/TAZO <=4 SENSITIVE Sensitive     * >=100,000 COLONIES/mL ESCHERICHIA COLI  SARS Coronavirus 2 by RT PCR (hospital order, performed in [place] hospital lab) Nasopharyngeal Nasopharyngeal Swab     Status: None   Collection Time: 01/22/20 12:02 PM   Specimen: Nasopharyngeal Swab  Result Value Ref Range Status   SARS Coronavirus 2 NEGATIVE NEGATIVE Final    Comment: (NOTE) SARS-CoV-2 target nucleic acids are NOT DETECTED.  The SARS-CoV-2 RNA is generally detectable in upper and lower respiratory specimens during the acute phase of infection. The lowest concentration of SARS-CoV-2 viral copies this assay can detect is 250 copies / mL. A negative result does  not preclude SARS-CoV-2 infection and should not be used as the sole basis for treatment or other patient management decisions.  A negative result may occur with improper specimen collection / handling, submission of specimen other than nasopharyngeal swab, presence of viral mutation(s) within the areas targeted by this assay, and inadequate number of viral copies (<250 copies / mL). A negative result must be combined with clinical observations, patient history, and epidemiological information.  Fact Sheet for Patients:   StrictlyIdeas.no  Fact Sheet for Healthcare Providers: BankingDealers.co.za  This test is not yet approved or  cleared by the [place] FDA and has been authorized for detection and/or diagnosis of SARS-CoV-2 by FDA under an Emergency Use Authorization (EUA).  This EUA will remain in effect (meaning this test can be used) for the duration of the COVID-19 declaration under Section 564(b)(1) of the Act, 21 U.S.C. section 360bbb-3(b)(1),  unless the authorization is terminated or revoked sooner.  Performed at [facility], [address] [place], [place] [address]   SARS Coronavirus 2 by RT PCR (hospital order, performed in [facility] hospital lab) Nasopharyngeal Nasopharyngeal Swab     Status: None   Collection Time: 01/26/20  7:44 AM   Specimen: Nasopharyngeal Swab  Result Value Ref Range Status   SARS Coronavirus 2 NEGATIVE NEGATIVE Final    Comment: (NOTE) SARS-CoV-2 target nucleic acids are NOT DETECTED.  The SARS-CoV-2 RNA is generally detectable in upper and lower respiratory specimens during the acute phase of infection. The lowest concentration of SARS-CoV-2 viral copies this assay can detect is 250 copies / mL. A negative result does not preclude SARS-CoV-2 infection and should not be used as the sole basis for treatment or other patient management decisions.  A negative result may occur  with improper specimen collection / handling, submission of specimen other than nasopharyngeal swab, presence of viral mutation(s) within the areas targeted by this assay, and inadequate number of viral copies (<250 copies / mL). A negative result must be combined with clinical observations, patient history, and epidemiological information.  Fact Sheet for Patients:   StrictlyIdeas.no  Fact Sheet for Healthcare Providers: BankingDealers.co.za  This test is not yet approved or  cleared by the [place] FDA and has been authorized for detection and/or diagnosis of SARS-CoV-2 by FDA under an Emergency Use Authorization (EUA).  This EUA will remain in effect (meaning this test can be used) for the duration of the COVID-19 declaration under Section 564(b)(1) of the Act, 21 U.S.C. section 360bbb-3(b)(1), unless the authorization is terminated or revoked sooner.  Performed at [facility], [address] [place], [place] [address]          Radiology Studies: DG Ribs Unilateral Right  Result Date: 01/26/2020 CLINICAL DATA:  Right lower back pain after fall today. EXAM: RIGHT RIBS - 2 VIEW COMPARISON:  None. FINDINGS: Possible mildly displaced fracture is seen involving the anterior portion of the right eleventh rib. No pneumothorax or pleural effusion is noted. No other rib abnormality is noted. IMPRESSION: Possible mildly displaced fracture involving the anterior portion of the right eleventh rib. Electronically Signed   By: Marijo Conception M.D.   On: 01/26/2020 08:57   CT Head Wo Contrast  Result Date: 01/26/2020 CLINICAL DATA:  Unwitnessed fall, headache. EXAM: CT HEAD WITHOUT CONTRAST CT CERVICAL SPINE WITHOUT CONTRAST TECHNIQUE: Multidetector CT imaging of the head and cervical spine was performed following the standard protocol without intravenous contrast. Multiplanar CT image reconstructions of the cervical spine were  also generated. COMPARISON:  MRI brain 04/05/2012 FINDINGS: CT HEAD FINDINGS Brain: Periventricular white matter and corona radiata hypodensities favor chronic ischemic microvascular white matter disease. Small remote lacunar infarct of the left caudate head. Otherwise, the brainstem, cerebellum, cerebral peduncles, thalamus, basal ganglia, basilar cisterns, and ventricular system appear within normal limits. Chronic intracranial fatty signal along the anterior falx, considered benign/incidental. Vascular: There is atherosclerotic calcification of the cavernous carotid arteries bilaterally. Mildly ectatic proximal left MCA. Skull: Unremarkable Sinuses/Orbits: Mucosal thickening in the ethmoid air cells compatible with mild chronic sinusitis. Along the upper margin of the left lateral rectus, a 1.3 by 0.4 by 0.4 cm structure with gas density but internal speckling of higher density is present. This slightly flattens the globe's contour. This is an unusual appearance, but this device appears to have been present back on the MRI of 04/05/2012, and this could represent  a micropore glaucoma drainage device (GDD) potentially with some physiologic nitrogen gas accumulation. Given the flattening of the contour, I am highly skeptical that this represents venous gas within a varix. Correlate with patient's ophthalmic history. Other: No supplemental non-categorized findings. CT CERVICAL SPINE FINDINGS Alignment: 1.5 mm degenerative anterolisthesis at C4-5 and C6-7. 1.5 mm degenerative retrolisthesis at C5-6. Skull base and vertebrae: Fused facet joint on the right at C3-4. Prominent loss of intervertebral disc height at C5-6 with questionable partial interbody fusion. Posterior partial interbody fusion at C3-4. Fused facet joint on the right at C7-T1. No cervical spine fracture or acute bony finding. Soft tissues and spinal canal: Bilateral carotid bulb atherosclerotic calcification. Disc levels: Cervical spondylosis  contributing to left foraminal impingement at C3-4, C4-5, and C5-6; and right foraminal impingement at C3-4, C4-5, C5-6, and possibly C2-3. Upper chest: Unremarkable Other: No supplemental non-categorized findings. IMPRESSION: 1. No acute intracranial findings or acute cervical spine findings. 2. Periventricular white matter and corona radiata hypodensities favor chronic ischemic microvascular white matter disease. Small remote lacunar infarct of the left caudate head. 3. Along the upper margin of the left lateral rectus, a 1.3 by 0.4 by 0.4 cm structure with gas density but internal speckling of higher density is present. This is an unusual appearance, but this device appears to been present back on the MRI of 04/05/2012, and this could represent a micropore glaucoma drainage device (GDD) potentially with some physiologic nitrogen gas accumulation. Given the flattening of the contour, I am highly skeptical that this represents venous gas within a varix. Correlate with patient's ophthalmic history. 4. Cervical spondylosis and degenerative disc disease causing multilevel impingement. 5. Atherosclerosis. 6. Mild chronic ethmoid sinusitis. Electronically Signed   By: Van Clines M.D.   On: 01/26/2020 07:02   CT Cervical Spine Wo Contrast  Result Date: 01/26/2020 CLINICAL DATA:  Unwitnessed fall, headache. EXAM: CT HEAD WITHOUT CONTRAST CT CERVICAL SPINE WITHOUT CONTRAST TECHNIQUE: Multidetector CT imaging of the head and cervical spine was performed following the standard protocol without intravenous contrast. Multiplanar CT image reconstructions of the cervical spine were also generated. COMPARISON:  MRI brain 04/05/2012 FINDINGS: CT HEAD FINDINGS Brain: Periventricular white matter and corona radiata hypodensities favor chronic ischemic microvascular white matter disease. Small remote lacunar infarct of the left caudate head. Otherwise, the brainstem, cerebellum, cerebral peduncles, thalamus, basal ganglia,  basilar cisterns, and ventricular system appear within normal limits. Chronic intracranial fatty signal along the anterior falx, considered benign/incidental. Vascular: There is atherosclerotic calcification of the cavernous carotid arteries bilaterally. Mildly ectatic proximal left MCA. Skull: Unremarkable Sinuses/Orbits: Mucosal thickening in the ethmoid air cells compatible with mild chronic sinusitis. Along the upper margin of the left lateral rectus, a 1.3 by 0.4 by 0.4 cm structure with gas density but internal speckling of higher density is present. This slightly flattens the globe's contour. This is an unusual appearance, but this device appears to have been present back on the MRI of 04/05/2012, and this could represent a micropore glaucoma drainage device (GDD) potentially with some physiologic nitrogen gas accumulation. Given the flattening of the contour, I am highly skeptical that this represents venous gas within a varix. Correlate with patient's ophthalmic history. Other: No supplemental non-categorized findings. CT CERVICAL SPINE FINDINGS Alignment: 1.5 mm degenerative anterolisthesis at C4-5 and C6-7. 1.5 mm degenerative retrolisthesis at C5-6. Skull base and vertebrae: Fused facet joint on the right at C3-4. Prominent loss of intervertebral disc height at C5-6 with questionable partial interbody fusion. Posterior partial interbody fusion  at C3-4. Fused facet joint on the right at C7-T1. No cervical spine fracture or acute bony finding. Soft tissues and spinal canal: Bilateral carotid bulb atherosclerotic calcification. Disc levels: Cervical spondylosis contributing to left foraminal impingement at C3-4, C4-5, and C5-6; and right foraminal impingement at C3-4, C4-5, C5-6, and possibly C2-3. Upper chest: Unremarkable Other: No supplemental non-categorized findings. IMPRESSION: 1. No acute intracranial findings or acute cervical spine findings. 2. Periventricular white matter and corona radiata  hypodensities favor chronic ischemic microvascular white matter disease. Small remote lacunar infarct of the left caudate head. 3. Along the upper margin of the left lateral rectus, a 1.3 by 0.4 by 0.4 cm structure with gas density but internal speckling of higher density is present. This is an unusual appearance, but this device appears to been present back on the MRI of 04/05/2012, and this could represent a micropore glaucoma drainage device (GDD) potentially with some physiologic nitrogen gas accumulation. Given the flattening of the contour, I am highly skeptical that this represents venous gas within a varix. Correlate with patient's ophthalmic history. 4. Cervical spondylosis and degenerative disc disease causing multilevel impingement. 5. Atherosclerosis. 6. Mild chronic ethmoid sinusitis. Electronically Signed   By: Van Clines M.D.   On: 01/26/2020 07:02   CT Hip Right Wo Contrast  Result Date: 01/26/2020 CLINICAL DATA:  Unwitnessed fall, right hip pain. EXAM: CT OF THE RIGHT HIP WITHOUT CONTRAST TECHNIQUE: Multidetector CT imaging of the right hip was performed according to the standard protocol. Multiplanar CT image reconstructions were also generated. COMPARISON:  Radiograph 01/26/2020 FINDINGS: Bones/Joint/Cartilage Extensive chronic fractures of the pubic bodies and adjacent pubic rami with resultant deformities. Although some of the fragments do not have corticated margins, the appearance is not substantially changed from 12/11/2015 and accordingly a new acute component is not observed. There is spurring of the right femoral head but without a well-defined right proximal femoral fracture. Ligaments Suboptimally assessed by CT. Muscles and Tendons Thickened right iliotibial band along the hip, although not changed from 01/22/2020, with stable mild overlying subcutaneous edema. Atrophic gluteus medius and gluteus minimus muscles. Soft tissues Stable subtle presacral edema. Right common  femoral artery atherosclerotic calcification. IMPRESSION: 1. Extensive chronic fractures of the pubic bodies and adjacent pubic rami with resultant deformities. Although some of the fragments do not have corticated margins, the appearance is not substantially changed from 12/11/2015 and accordingly a new acute component is not observed. 2. No fracture of the acetabulum or right proximal femur is apparent on CT. 3. Thickened right iliotibial band along the hip, although not changed from 01/22/2020, with stable mild overlying subcutaneous edema. 4. Atrophic gluteus medius and gluteus minimus muscles. 5. Right common femoral artery atherosclerotic calcification. Electronically Signed   By: Van Clines M.D.   On: 01/26/2020 07:12   DG Chest Portable 1 View  Result Date: 01/26/2020 CLINICAL DATA:  Chest pain. EXAM: PORTABLE CHEST 1 VIEW COMPARISON:  Chest x-ray 01/22/2020.  CT abdomen 01/22/2020. FINDINGS: Mediastinum hilar structures normal. Heart size stable. Low lung volumes with mild bibasilar subsegmental atelectasis. No pleural effusion or pneumothorax. Prominent skin fold on the left. Degenerative change and scoliosis thoracic spine. Degenerative changes both shoulders. No air noted under the hemidiaphragms as previously questioned on prior study. IMPRESSION: 1.  Low lung volumes with mild bibasilar subsegmental atelectasis. 2. No air noted under the hemidiaphragms as previously questioned prior chest x-ray of 01/22/2020. Electronically Signed   By: Marcello Moores  Register   On: 01/26/2020 06:08   ECHOCARDIOGRAM COMPLETE  Result Date: 01/26/2020    ECHOCARDIOGRAM REPORT   Patient Name:   LONNEL GJERDE Date of Exam: 01/26/2020 Medical Rec #:  [id number]       Height:       72.0 in Accession #:    [id number]      Weight:       220.0 lb Date of Birth:  1925-01-21       BSA:          2.219 m Patient Age:    68 years        BP:           128/98 mmHg Patient Gender: M               HR:           73 bpm. Exam  Location:  [facility] Procedure: 2D Echo, Cardiac Doppler and Color Doppler Indications:     Atrial Fibrillation 427.31  History:         Patient has prior history of Echocardiogram examinations, most                  recent 12/27/2015. Risk Factors:Hypertension. Pulmonary embolism,                  pneumonia.  Sonographer:     Sherrie Sport RDCS (AE) Referring Phys:  [place] Diagnosing Phys: Serafina Royals MD IMPRESSIONS  1. Left ventricular ejection fraction, by estimation, is 40 to 45%. The left ventricle has mildly decreased function. The left ventricle demonstrates global hypokinesis. Left ventricular diastolic parameters were normal.  2. Right ventricular systolic function is normal. The right ventricular size is normal. There is normal pulmonary artery systolic pressure.  3. Left atrial size was mildly dilated.  4. The mitral valve is normal in structure. Mild mitral valve regurgitation.  5. The aortic valve is normal in structure. Aortic valve regurgitation is not visualized. FINDINGS  Left Ventricle: Left ventricular ejection fraction, by estimation, is 40 to 45%. The left ventricle has mildly decreased function. The left ventricle demonstrates global hypokinesis. The left ventricular internal cavity size was normal in size. There is  no left ventricular hypertrophy. Left ventricular diastolic parameters were normal. Right Ventricle: The right ventricular size is normal. No increase in right ventricular wall thickness. Right ventricular systolic function is normal. There is normal pulmonary artery systolic pressure. The tricuspid regurgitant velocity is 1.59 m/s, and  with an assumed right atrial pressure of 10 mmHg, the estimated right ventricular systolic pressure is 65.5 mmHg. Left Atrium: Left atrial size was mildly dilated. Right Atrium: Right atrial size was normal in size. Pericardium: There is no evidence of pericardial effusion. Mitral Valve: The mitral valve is normal in structure. Mild mitral  valve regurgitation. Tricuspid Valve: The tricuspid valve is normal in structure. Tricuspid valve regurgitation is mild. Aortic Valve: The aortic valve is normal in structure. Aortic valve regurgitation is not visualized. Aortic valve mean gradient measures 2.0 mmHg. Aortic valve peak gradient measures 3.4 mmHg. Aortic valve area, by VTI measures 3.79 cm. Pulmonic Valve: The pulmonic valve was normal in structure. Pulmonic valve regurgitation is not visualized. Aorta: The aortic root and ascending aorta are structurally normal, with no evidence of dilitation. IAS/Shunts: No atrial level shunt detected by color flow Doppler.  LEFT VENTRICLE PLAX 2D LVIDd:         3.46 cm  Diastology LVIDs:         2.39 cm  LV e' lateral:  8.92 cm/s LV PW:         0.98 cm  LV E/e' lateral: 6.5 LV IVS:        1.33 cm  LV e' medial:    8.05 cm/s LVOT diam:     2.20 cm  LV E/e' medial:  7.2 LV SV:         75 LV SV Index:   34 LVOT Area:     3.80 cm  RIGHT VENTRICLE RV Basal diam:  3.94 cm RV S prime:     14.80 cm/s TAPSE (M-mode): 3.4 cm LEFT ATRIUM             Index       RIGHT ATRIUM           Index LA diam:        3.90 cm 1.76 cm/m  RA Area:     21.80 cm LA Vol (A2C):   59.0 ml 26.59 ml/m RA Volume:   65.80 ml  29.66 ml/m LA Vol (A4C):   35.9 ml 16.18 ml/m LA Biplane Vol: 44.9 ml 20.24 ml/m  AORTIC VALVE                   PULMONIC VALVE AV Area (Vmax):    3.73 cm    PV Vmax:       0.79 m/s AV Area (Vmean):   3.05 cm    PV Peak grad:  2.5 mmHg AV Area (VTI):     3.79 cm AV Vmax:           92.55 cm/s AV Vmean:          61.750 cm/s AV VTI:            0.198 m AV Peak Grad:      3.4 mmHg AV Mean Grad:      2.0 mmHg LVOT Vmax:         90.90 cm/s LVOT Vmean:        49.500 cm/s LVOT VTI:          0.197 m LVOT/AV VTI ratio: 1.00  AORTA Ao Root diam: 3.40 cm MITRAL VALVE               TRICUSPID VALVE MV Area (PHT): 2.02 cm    TR Peak grad:   10.1 mmHg MV Decel Time: 375 msec    TR Vmax:        159.00 cm/s MV E velocity: 57.70 cm/s  MV A velocity: 99.40 cm/s  SHUNTS MV E/A ratio:  0.58        Systemic VTI:  0.20 m                            Systemic Diam: 2.20 cm Serafina Royals MD Electronically signed by Serafina Royals MD Signature Date/Time: 01/26/2020/7:07:30 PM    Final    DG Hip Unilat W or Wo Pelvis 2-3 Views Right  Result Date: 01/26/2020 CLINICAL DATA:  Fall. EXAM: DG HIP (WITH OR WITHOUT PELVIS) 2-3V RIGHT COMPARISON:  CT 01/22/2020, 12/11/2015. FINDINGS: Degenerative change lumbar spine and both hips. Stable chronic deformity noted of the pubis bilaterally. No acute bony or joint abnormality identified. No evidence of acute fracture or dislocation. Right hip is intact. Peripheral vascular calcification. IMPRESSION: 1. Degenerative changes lumbar spine and both hips. Stable chronic deformity of the pubis bilaterally. 2.  No acute abnormality identified.  Right hip is intact. 3.  Peripheral  vascular disease. Electronically Signed   By: Marcello Moores  Register   On: 01/26/2020 06:11        Scheduled Meds:  acetaminophen  975 mg Oral TID   amLODipine  5 mg Oral Daily   calcium-vitamin D  2 tablet Oral BID   clopidogrel  75 mg Oral Daily   dapsone  25 mg Oral Once per day on Sun Mon Wed Fri   enoxaparin (LOVENOX) injection  40 mg Subcutaneous Q24H   ferrous sulfate  325 mg Oral Q breakfast   hydrochlorothiazide  12.5 mg Oral Daily   lisinopril  10 mg Oral Daily   multivitamin-lutein   Oral BID   pantoprazole  40 mg Oral Daily   polyvinyl alcohol  1 drop Both Eyes QID   potassium chloride SA  20 mEq Oral Daily   sodium chloride flush  3 mL Intravenous Q12H   tamsulosin  0.8 mg Oral q1800   Continuous Infusions:  sodium chloride 250 mL (01/27/20 0833)   cefTRIAXone (ROCEPHIN)  IV 1 g (01/27/20 0835)    Assessment & Plan:   Principal Problem:   Acute right hip pain Active Problems:   Hypertension   Acute lower UTI   Impaired ambulation   BPH without obstruction/lower urinary tract symptoms    Unspecified atrial fibrillation (HCC)   Right hip pain   S/p fall with Acute right hip pain difficulty ambulating Imaging did not show an acute fracture but shows  extensive chronic fractures of the pubic bodies and adjacent pubic rami with resultant deformities.  Pain management By lidocaine patch PT/OT Fall precautions May need rehab    E.coli UTI Patient was seen in the emergency room 3 days prior to this hospitalization and had pyuria at that time Continue with Rocephin     ?Atrial fibrillation- ruled out. Not evident Cards input was appreciated -EKG showing sinus bradycardia ectopic atrial rhythm and preventricular contractions but no evidence of atrial fibrillation or advanced heart block We will continue to monitor on telemetry especially for heart block or other causes of his falls May need outpatient monitor if no arrhythmias detected Avoid beta-blockers or other medications that can cause bradycardia Echo with EF 40-45%- notified cards today of findings. Doubt will need ischmic evaluation, may need medical conservative therapy.    Hypertension Continue hydrochlorothiazide, lisinopril and amlodipine   BPH Continue Flomax   DVT prophylaxis: Lovenox Code Status: Full code Family Communication: none at bedside Disposition Plan: Back to previous home environment v.s. rehab PT ordered Status [id number]  The patient will require care spanning > 2 midnights and should be moved to inpatient because: Unsafe d/c plan  Dispo: The patient is from: Home              Anticipated d/c is JJ:[facility] vs. rehab              Anticipated d/c date is: 2 days              Patient currently is not medically stable to d/c. Patient need safe d/c planning. PT/OT. Also cardiac mx.           LOS: 0 days   Time spent: 45 min with >50% on coc    Nolberto Hanlon, MD [facility] Pager [phone]  If 7PM-7AM, please contact night-coverage [email or web address] Password  [facility] 01/27/2020, 2:47 PM

## 2020-01-28 NOTE — Evaluation (Signed)
Occupational Therapy Evaluation Patient Details Name: Geoffrey Barber MRN: [id number] DOB: November 02, 1924 Today's Date: 01/28/2020    History of Present Illness presented to ER and admitted for acute hospitalization s/p to mechanical fall in home environment with acute set of R hip pain. Imaging negative for acute R hip orthopedic injury; does indicate extensive chronic fractures to pelvis and R 11th rib fracture.   Clinical Impression   [name] was seen for OT evaluation this date. Prior to hospital admission, pt was MOD I for mobility and ADLs using RW. Pt lives in ALF at [place] c wife. Pt presents to acute OT demonstrating impaired ADL performance, functional cognition, and functional mobility 2/2 decreased safety awareness, difficulty sequencing, functional strength/ROM/balance deficits, and decreased activity tolerance. Pt currently requires SETUP for self-feeding at bed level - pt lost utensils in bedding and required assist to locate. MOD A + RW + VCs for simulated BSC t/f (pt asking "what next" after each leg movement). VCs for urinal use at bed level. Pt would benefit from skilled OT to address noted impairments and functional limitations (see below for any additional details) in order to maximize safety and independence while minimizing falls risk and caregiver burden. Upon hospital discharge, recommend STR to maximize pt safety and return to PLOF.     Follow Up Recommendations  SNF    Equipment Recommendations   (TBD at next venue of care)    Recommendations for Other Services       Precautions / Restrictions Precautions Precautions: Fall Restrictions Weight Bearing Restrictions: No      Mobility Bed Mobility Overal bed mobility: Needs Assistance Bed Mobility: Supine to Sit     Supine to sit: Min assist     General bed mobility comments: assist for trunk elevation and aligning R hip to EOB  Transfers Overall transfer level: Needs assistance Equipment  used: Rolling walker (2 wheeled) Transfers: Sit to/from [facility] Sit to Stand: Mod assist;From elevated surface Stand pivot transfers: Mod assist       General transfer comment: Pt reports "feet are slipping" requiring LE foot block for sit>stand. MAX VCs + RW + MIN A during SPT bed>chair c pt asking "what next" after each leg movement.     Balance Overall balance assessment: Needs assistance Sitting-balance support: No upper extremity supported;Feet supported Sitting balance-Leahy Scale: Good     Standing balance support: Bilateral upper extremity supported Standing balance-Leahy Scale: Poor                             ADL either performed or assessed with clinical judgement   ADL Overall ADL's : Needs assistance/impaired                                       General ADL Comments: SETUP self-feeding at bed level - pt lost utensils in bedding and required assist to locate. MOD A + RW + VCs for simulated BSC t/f. VCs for urinal use at bed level.      Vision Baseline Vision/History: Wears glasses Wears Glasses: At all times       Perception     Praxis      Pertinent Vitals/Pain Pain Assessment: No/denies pain (Pt sits c weight offloaded onto L hip )     Hand Dominance Right   Extremity/Trunk Assessment Upper Extremity Assessment Upper  Extremity Assessment: Generalized weakness (Decreased FMC of nondominant LUE )   Lower Extremity Assessment Lower Extremity Assessment: Generalized weakness       Communication Communication Communication: HOH   Cognition Arousal/Alertness: Awake/alert Behavior During Therapy: WFL for tasks assessed/performed Overall Cognitive Status: Within Functional Limits for tasks assessed                                     General Comments  SpO2 93% on RA t/o mobility and seated exercises    Exercises Exercises: Other exercises Other Exercises Other Exercises: Pt educated  re: OT role, DME recs, IS (provided) frequency and use, BUE HEP, importance of mobility for functional strengthening, safe RW technique  Other Exercises: UBD, toileting, self-feeding, BUE exercises (overhead press, palm to finger translation, isometric pinch grip), sup>sit, sit<>stand, sitting/standing balance/tolerance   Shoulder Instructions      Home Living Family/patient expects to be discharged to:: Skilled nursing facility Living Arrangements: Other (Comment) ([place] [place])                               Additional Comments: Resident of ALF section of [facility] [place]      Prior Functioning/Environment Level of Independence: Needs assistance        Comments: Mod indep with in-room mobility and toileting with RW; staff brings meals to room.  does endorse multiple fall history, unable to quantify.        OT Problem List: Decreased strength;Decreased range of motion;Decreased activity tolerance;Impaired balance (sitting and/or standing);Decreased coordination;Decreased safety awareness;Decreased knowledge of use of DME or AE      OT Treatment/Interventions: Self-care/ADL training;Therapeutic exercise;Energy conservation;DME and/or AE instruction;Therapeutic activities;Patient/family education;Balance training    OT Goals(Current goals can be found in the care plan section) Acute Rehab OT Goals Patient Stated Goal: " to try" OT Goal Formulation: With patient Time For Goal Achievement: 02/11/20 Potential to Achieve Goals: Good ADL Goals Pt Will Perform Grooming: with set-up;bed level Pt Will Transfer to Toilet: with min assist;stand pivot transfer;bedside commode (c LRAD PRN) Additional ADL Goal #1: Pt will Independently verbalize plan to implement x3 falls prevention strategies  OT Frequency: Min 1X/week   Barriers to D/C: Decreased caregiver support          Co-evaluation              AM-PAC OT "6 Clicks" Daily Activity     Outcome  Measure Help from another person eating meals?: A Little Help from another person taking care of personal grooming?: A Little Help from another person toileting, which includes using toliet, bedpan, or urinal?: A Lot Help from another person bathing (including washing, rinsing, drying)?: A Lot Help from another person to put on and taking off regular upper body clothing?: A Little Help from another person to put on and taking off regular lower body clothing?: A Lot 6 Click Score: 15   End of Session Equipment Utilized During Treatment: Gait belt;Rolling walker Nurse Communication: Mobility status  Activity Tolerance: Patient tolerated treatment well Patient left: in chair;with call bell/phone within reach;with chair alarm set  OT Visit Diagnosis: Unsteadiness on feet (R26.81);Other abnormalities of gait and mobility (R26.89);History of falling (Z91.81)                Time: 0930-1003 OT Time Calculation (min): 33 min Charges:  OT General Charges $OT Visit:  1 Visit OT Evaluation $OT Eval Moderate Complexity: 1 Mod OT Treatments $Self Care/Home Management : 23-37 mins  Dessie Coma, M.S. OTR/L  01/28/20, 10:27 AM

## 2020-01-28 NOTE — Progress Notes (Signed)
PROGRESS NOTE    Geoffrey Barber  ZOX:[id number] DOB: 03-Apr-1925 DOA: 01/26/2020 PCP: Derinda Late, MD    Brief Narrative:  Geoffrey Barber is a 86 y.o. male with medical history significant for prostate cancer, hypertension, GERD and BPH who presents to the emergency room via EMS after an unwitnessed fall.   Right hip x-ray showed degenerative changes lumbar spine and both hips. Stable chronic deformity of the pubis bilaterally. No acute abnormality identified. Right hip is intact. Peripheral vascular disease. He had a CT scan of the right hip which showed  extensive chronic fractures of the pubic bodies and adjacent pubic rami with resultant deformities. Although some of the fragments do not have corticated margins, the appearance is not substantially changed from 12/11/2015 and accordingly a new acute component is not Observed. There is spurring of the right femoral head but without a well-defined right proximal femoral fracture. Twelve-lead EKG reviewed by me shows A. fib with PVCs   Consultants:   cardiology  Procedures:   Antimicrobials:       Subjective: Sitting in chair, has no complaints. Denies pain, n/v chills, dizziness, cp, sob  Objective: Vitals:   01/27/20 0726 01/27/20 1656 01/28/20 0008 01/28/20 0741  BP: 122/66 133/84 136/76 130/73  Pulse: (!) 56 (!) 57 (!) 56 61  Resp: 18 16 19 18   Temp: 97.7 F (36.5 C) 97.7 F (36.5 C) 97.8 F (36.6 C) 97.7 F (36.5 C)  TempSrc:  Oral Oral Oral  SpO2: 96% 98% 96% 96%  Weight:      Height:        Intake/Output Summary (Last 24 hours) at 01/28/2020 0850 Last data filed at 01/28/2020 0500 Gross per 24 hour  Intake 347.69 ml  Output 1100 ml  Net -752.31 ml   Filed Weights   01/26/20 0449  Weight: 99.8 kg    Examination:  General exam: Comfortable sitting in chair NAD Respiratory system: Clear to auscultation, no wheeze rales rhonchi's  Cardiovascular system: Regular bradycardia no murmurs rubs gallops   Gastrointestinal system: Soft nontender nondistended positive bowel sounds  Central nervous system: Awake and alert, grossly intact Extremities: No edema or cyanosis Skin: Warm dry Psychiatry: Mood and affect appropriate in current setting      Data Reviewed: I have personally reviewed following labs and imaging studies  CBC: Recent Labs  Lab 01/22/20 1020 01/26/20 0446 01/27/20 0423  WBC 5.5 8.3 6.0  NEUTROABS 2.9 5.7  --   HGB 11.8* 10.6* 9.3*  HCT 34.1* 31.3* 26.7*  MCV 92.9 95.1 93.0  PLT 231 229 811   Basic Metabolic Panel: Recent Labs  Lab 01/22/20 1020 01/26/20 0446 01/27/20 0423  NA 139 141 138  K 4.1 3.9 4.4  CL 107 108 106  CO2 23 24 26   GLUCOSE 124* 126* 111*  BUN 46* 58* 52*  CREATININE 1.42* 1.50* 1.25*  CALCIUM 9.0 9.4 9.1   GFR: Estimated Creatinine Clearance: 45.2 mL/min (A) (by C-G formula based on SCr of 1.25 mg/dL (H)). Liver Function Tests: Recent Labs  Lab 01/26/20 0446  AST 29  ALT 20  ALKPHOS 46  BILITOT 0.9  PROT 6.9  ALBUMIN 4.1   No results for input(s): LIPASE, AMYLASE in the last 168 hours. No results for input(s): AMMONIA in the last 168 hours. Coagulation Profile: No results for input(s): INR, PROTIME in the last 168 hours. Cardiac Enzymes: No results for input(s): CKTOTAL, CKMB, CKMBINDEX, TROPONINI in the last 168 hours. BNP (last 3 results) No results  for input(s): PROBNP in the last 8760 hours. HbA1C: No results for input(s): HGBA1C in the last 72 hours. CBG: No results for input(s): GLUCAP in the last 168 hours. Lipid Profile: No results for input(s): CHOL, HDL, LDLCALC, TRIG, CHOLHDL, LDLDIRECT in the last 72 hours. Thyroid Function Tests: Recent Labs    01/26/20 0446  TSH 5.920*   Anemia Panel: No results for input(s): VITAMINB12, FOLATE, FERRITIN, TIBC, IRON, RETICCTPCT in the last 72 hours. Sepsis Labs: No results for input(s): PROCALCITON, LATICACIDVEN in the last 168 hours.  Recent Results (from  the past 240 hour(s))  Urine culture     Status: Abnormal   Collection Time: 01/22/20 10:43 AM   Specimen: Urine, Random  Result Value Ref Range Status   Specimen Description   Final    URINE, RANDOM Performed at [facility], [address] [place], [place] [address]    Special Requests   Final    NONE Performed at [facility], [place], [place] [address]    Culture >=100,000 COLONIES/mL ESCHERICHIA COLI (A)  Final   Report Status 01/25/2020 FINAL  Final   Organism ID, Bacteria ESCHERICHIA COLI (A)  Final      Susceptibility   Escherichia coli - MIC*    AMPICILLIN 4 SENSITIVE Sensitive     CEFAZOLIN <=4 SENSITIVE Sensitive     CEFTRIAXONE <=0.25 SENSITIVE Sensitive     CIPROFLOXACIN <=0.25 SENSITIVE Sensitive     GENTAMICIN <=1 SENSITIVE Sensitive     IMIPENEM <=0.25 SENSITIVE Sensitive     NITROFURANTOIN <=16 SENSITIVE Sensitive     TRIMETH/SULFA <=20 SENSITIVE Sensitive     AMPICILLIN/SULBACTAM <=2 SENSITIVE Sensitive     PIP/TAZO <=4 SENSITIVE Sensitive     * >=100,000 COLONIES/mL ESCHERICHIA COLI  SARS Coronavirus 2 by RT PCR (hospital order, performed in [place] hospital lab) Nasopharyngeal Nasopharyngeal Swab     Status: None   Collection Time: 01/22/20 12:02 PM   Specimen: Nasopharyngeal Swab  Result Value Ref Range Status   SARS Coronavirus 2 NEGATIVE NEGATIVE Final    Comment: (NOTE) SARS-CoV-2 target nucleic acids are NOT DETECTED.  The SARS-CoV-2 RNA is generally detectable in upper and lower respiratory specimens during the acute phase of infection. The lowest concentration of SARS-CoV-2 viral copies this assay can detect is 250 copies / mL. A negative result does not preclude SARS-CoV-2 infection and should not be used as the sole basis for treatment or other patient management decisions.  A negative result may occur with improper specimen collection / handling, submission of specimen other than nasopharyngeal swab,  presence of viral mutation(s) within the areas targeted by this assay, and inadequate number of viral copies (<250 copies / mL). A negative result must be combined with clinical observations, patient history, and epidemiological information.  Fact Sheet for Patients:   StrictlyIdeas.no  Fact Sheet for Healthcare Providers: BankingDealers.co.za  This test is not yet approved or  cleared by the [place] FDA and has been authorized for detection and/or diagnosis of SARS-CoV-2 by FDA under an Emergency Use Authorization (EUA).  This EUA will remain in effect (meaning this test can be used) for the duration of the COVID-19 declaration under Section 564(b)(1) of the Act, 21 U.S.C. section 360bbb-3(b)(1), unless the authorization is terminated or revoked sooner.  Performed at [facility], [address] [place], [place] [address]   SARS Coronavirus 2 by RT PCR (hospital order, performed in [place] lab) Nasopharyngeal Nasopharyngeal Swab  Status: None   Collection Time: 01/26/20  7:44 AM   Specimen: Nasopharyngeal Swab  Result Value Ref Range Status   SARS Coronavirus 2 NEGATIVE NEGATIVE Final    Comment: (NOTE) SARS-CoV-2 target nucleic acids are NOT DETECTED.  The SARS-CoV-2 RNA is generally detectable in upper and lower respiratory specimens during the acute phase of infection. The lowest concentration of SARS-CoV-2 viral copies this assay can detect is 250 copies / mL. A negative result does not preclude SARS-CoV-2 infection and should not be used as the sole basis for treatment or other patient management decisions.  A negative result may occur with improper specimen collection / handling, submission of specimen other than nasopharyngeal swab, presence of viral mutation(s) within the areas targeted by this assay, and inadequate number of viral copies (<250 copies / mL). A negative result must be  combined with clinical observations, patient history, and epidemiological information.  Fact Sheet for Patients:   StrictlyIdeas.no  Fact Sheet for Healthcare Providers: BankingDealers.co.za  This test is not yet approved or  cleared by the [place] FDA and has been authorized for detection and/or diagnosis of SARS-CoV-2 by FDA under an Emergency Use Authorization (EUA).  This EUA will remain in effect (meaning this test can be used) for the duration of the COVID-19 declaration under Section 564(b)(1) of the Act, 21 U.S.C. section 360bbb-3(b)(1), unless the authorization is terminated or revoked sooner.  Performed at [facility], [address], [place], [place] [address]          Radiology Studies: ECHOCARDIOGRAM COMPLETE  Result Date: 01/26/2020    ECHOCARDIOGRAM REPORT   Patient Name:   Geoffrey Barber Date of Exam: 01/26/2020 Medical Rec #:  [id number]       Height:       72.0 in Accession #:    [id number]      Weight:       220.0 lb Date of Birth:  08-Nov-1924       BSA:          2.219 m Patient Age:    44 years        BP:           128/98 mmHg Patient Gender: M               HR:           73 bpm. Exam Location:  [facility] Procedure: 2D Echo, Cardiac Doppler and Color Doppler Indications:     Atrial Fibrillation 427.31  History:         Patient has prior history of Echocardiogram examinations, most                  recent 12/27/2015. Risk Factors:Hypertension. Pulmonary embolism,                  pneumonia.  Sonographer:     Sherrie Sport RDCS (AE) Referring Phys:  [employer] [place] Diagnosing Phys: Serafina Royals MD IMPRESSIONS  1. Left ventricular ejection fraction, by estimation, is 40 to 45%. The left ventricle has mildly decreased function. The left ventricle demonstrates global hypokinesis. Left ventricular diastolic parameters were normal.  2. Right ventricular systolic function is normal. The right ventricular size is  normal. There is normal pulmonary artery systolic pressure.  3. Left atrial size was mildly dilated.  4. The mitral valve is normal in structure. Mild mitral valve regurgitation.  5. The aortic valve is normal in structure. Aortic valve regurgitation is not visualized. FINDINGS  Left Ventricle: Left ventricular ejection fraction, by estimation, is 40 to 45%. The left ventricle has mildly decreased function. The left ventricle demonstrates global hypokinesis. The left ventricular internal cavity size was normal in size. There is  no left ventricular hypertrophy. Left ventricular diastolic parameters were normal. Right Ventricle: The right ventricular size is normal. No increase in right ventricular wall thickness. Right ventricular systolic function is normal. There is normal pulmonary artery systolic pressure. The tricuspid regurgitant velocity is 1.59 m/s, and  with an assumed right atrial pressure of 10 mmHg, the estimated right ventricular systolic pressure is 07.6 mmHg. Left Atrium: Left atrial size was mildly dilated. Right Atrium: Right atrial size was normal in size. Pericardium: There is no evidence of pericardial effusion. Mitral Valve: The mitral valve is normal in structure. Mild mitral valve regurgitation. Tricuspid Valve: The tricuspid valve is normal in structure. Tricuspid valve regurgitation is mild. Aortic Valve: The aortic valve is normal in structure. Aortic valve regurgitation is not visualized. Aortic valve mean gradient measures 2.0 mmHg. Aortic valve peak gradient measures 3.4 mmHg. Aortic valve area, by VTI measures 3.79 cm. Pulmonic Valve: The pulmonic valve was normal in structure. Pulmonic valve regurgitation is not visualized. Aorta: The aortic root and ascending aorta are structurally normal, with no evidence of dilitation. IAS/Shunts: No atrial level shunt detected by color flow Doppler.  LEFT VENTRICLE PLAX 2D LVIDd:         3.46 cm  Diastology LVIDs:         2.39 cm  LV e' lateral:    8.92 cm/s LV PW:         0.98 cm  LV E/e' lateral: 6.5 LV IVS:        1.33 cm  LV e' medial:    8.05 cm/s LVOT diam:     2.20 cm  LV E/e' medial:  7.2 LV SV:         75 LV SV Index:   34 LVOT Area:     3.80 cm  RIGHT VENTRICLE RV Basal diam:  3.94 cm RV S prime:     14.80 cm/s TAPSE (M-mode): 3.4 cm LEFT ATRIUM             Index       RIGHT ATRIUM           Index LA diam:        3.90 cm 1.76 cm/m  RA Area:     21.80 cm LA Vol (A2C):   59.0 ml 26.59 ml/m RA Volume:   65.80 ml  29.66 ml/m LA Vol (A4C):   35.9 ml 16.18 ml/m LA Biplane Vol: 44.9 ml 20.24 ml/m  AORTIC VALVE                   PULMONIC VALVE AV Area (Vmax):    3.73 cm    PV Vmax:       0.79 m/s AV Area (Vmean):   3.05 cm    PV Peak grad:  2.5 mmHg AV Area (VTI):     3.79 cm AV Vmax:           92.55 cm/s AV Vmean:          61.750 cm/s AV VTI:            0.198 m AV Peak Grad:      3.4 mmHg AV Mean Grad:      2.0 mmHg LVOT Vmax:         90.90  cm/s LVOT Vmean:        49.500 cm/s LVOT VTI:          0.197 m LVOT/AV VTI ratio: 1.00  AORTA Ao Root diam: 3.40 cm MITRAL VALVE               TRICUSPID VALVE MV Area (PHT): 2.02 cm    TR Peak grad:   10.1 mmHg MV Decel Time: 375 msec    TR Vmax:        159.00 cm/s MV E velocity: 57.70 cm/s MV A velocity: 99.40 cm/s  SHUNTS MV E/A ratio:  0.58        Systemic VTI:  0.20 m                            Systemic Diam: 2.20 cm Serafina Royals MD Electronically signed by Serafina Royals MD Signature Date/Time: 01/26/2020/7:07:30 PM    Final         Scheduled Meds: . acetaminophen  975 mg Oral TID  . amLODipine  5 mg Oral Daily  . calcium-vitamin D  2 tablet Oral BID  . clopidogrel  75 mg Oral Daily  . dapsone  25 mg Oral Once per day on Sun Mon Wed Fri  . enoxaparin (LOVENOX) injection  40 mg Subcutaneous Q24H  . ferrous sulfate  325 mg Oral Q breakfast  . hydrochlorothiazide  12.5 mg Oral Daily  . lidocaine  1 patch Transdermal Q24H  . lisinopril  10 mg Oral Daily  . multivitamin-lutein   Oral BID  .  pantoprazole  40 mg Oral Daily  . polyvinyl alcohol  1 drop Both Eyes QID  . potassium chloride SA  20 mEq Oral Daily  . sodium chloride flush  3 mL Intravenous Q12H  . tamsulosin  0.8 mg Oral q1800   Continuous Infusions: . sodium chloride 250 mL (01/28/20 0827)  . cefTRIAXone (ROCEPHIN)  IV 1 g (01/28/20 0829)    Assessment & Plan:   Principal Problem:   Acute right hip pain Active Problems:   Hypertension   Acute lower UTI   Impaired ambulation   BPH without obstruction/lower urinary tract symptoms   Unspecified atrial fibrillation (HCC)   Right hip pain   Fall   S/p fall with Acute right hip pain difficulty ambulating Imaging did not show an acute fracture but shows  extensive chronic fractures of the pubic bodies and adjacent pubic rami with resultant deformities.  Plan Continue pain management and lidocaine patch lidocaine patch PT OT recommend SNF-spoke with patient he is agreeable to this. Fall precautions    E.coli UTI Patient was seen in the emergency room 3 days prior to this hospitalization and had pyuria at that time Urine culture with E. coli.   Continue with IV Rocephin    ?Atrial fibrillation-none found on telemetry Cards input was appreciated -appears to be ectopic atrial rhythm with preventricular contractions, no evidence of afib or adv. Heart block Cards following Tele sb today From cards stand point ok to d/c to SNF  Avoid beta-blockers or other medications that can cause bradycardia Echo with EF 40-45%- medical mx per cards     Hypertension Continue HCTZ, lisinopril, amlodipine   BPH Continue Flomax   DVT prophylaxis: Lovenox Code Status: Full code Family Communication: none at bedside Disposition Plan:  SNF Status [id number]  The patient will require care spanning > 2 midnights and should be moved to inpatient because: Unsafe d/c  plan  Dispo: The patient is from: Home              Anticipated d/c is to SNF               Anticipated d/c date is: 2 days              Patient currently is not medically stable to d/c. Patient need safe d/c planning for SNF          LOS: 1 day   Time spent: 45 min with >50% on coc    Nolberto Hanlon, MD [facility] Pager [phone]  If 7PM-7AM, please contact night-coverage [email or web address] Password [facility] 01/28/2020, 8:50 AM

## 2020-01-29 MED ORDER — CEPHALEXIN 500 MG PO CAPS: 500.0000 mg | ORAL_CAPSULE | Freq: Three times a day (TID) | ORAL | 0 refills | Status: AC

## 2020-01-29 MED ORDER — BACID PO TABS
2.0000 | ORAL_TABLET | Freq: Three times a day (TID) | ORAL | Status: DC
  Filled 2020-01-29 (×5): qty 2

## 2020-01-29 MED ORDER — BACID PO TABS: 2.0000 | ORAL_TABLET | Freq: Three times a day (TID) | ORAL | Status: AC

## 2020-01-29 MED ORDER — LIDOCAINE 5 % EX PTCH: 1.0000 | MEDICATED_PATCH | CUTANEOUS | 0 refills | Status: DC

## 2020-01-29 MED ORDER — RISAQUAD PO CAPS
2.0000 | ORAL_CAPSULE | Freq: Three times a day (TID) | ORAL | Status: DC
  Administered 2020-01-30 (×2): 2 via ORAL
  Filled 2020-01-29 (×5): qty 2

## 2020-01-29 MED ORDER — POLYETHYLENE GLYCOL 3350 17 G PO PACK: 17.0000 g | PACK | Freq: Every day | ORAL | 0 refills | Status: DC | PRN

## 2020-01-29 NOTE — NC FL2 (Signed)
[place] LEVEL OF CARE SCREENING TOOL     IDENTIFICATION  Patient Name: Geoffrey Barber Birthdate: [birth year] Sex: male Admission Date (Current Location): 01/26/2020  [place] and [place] Number:  Engineering geologist and Address:  [facility], [address] [place], [place] [address]      Provider Number: [id number]  Attending Physician Name and Address:  Nolberto Hanlon, MD  Relative Name and Phone Number:  [name] SPouse [phone]    Current Level of Care: Hospital Recommended Level of Care: [place] Prior Approval Number:    Date Approved/Denied:   PASRR Number: [id number]  Discharge Plan: SNF    Current Diagnoses: Patient Active Problem List   Diagnosis Date Noted  . Fall 01/27/2020  . Acute lower UTI 01/26/2020  . Impaired ambulation 01/26/2020  . Acute right hip pain 01/26/2020  . Right hip pain 01/26/2020  . BPH without obstruction/lower urinary tract symptoms   . Unspecified atrial fibrillation ([place])   . DJD (degenerative joint disease) 06/04/2016  . Acute pulmonary embolism ([employer]) 05/11/2016  . Bladder cancer ([place]) 05/11/2016  . Hypertension 05/11/2016    Orientation RESPIRATION BLADDER Height & Weight     Self, Place, Situation  Normal Continent Weight: 99.8 kg Height:  6' (182.9 cm)  BEHAVIORAL SYMPTOMS/MOOD NEUROLOGICAL BOWEL NUTRITION STATUS      Continent Diet (2 gram sodium)  AMBULATORY STATUS COMMUNICATION OF NEEDS Skin   Extensive Assist Verbally Normal                       Personal Care Assistance Level of Assistance  Bathing, Dressing Bathing Assistance: Limited assistance   Dressing Assistance: Limited assistance     Functional Limitations Info             SPECIAL CARE FACTORS FREQUENCY  PT (By licensed PT), OT (By licensed OT)     PT Frequency: 5 times per week OT Frequency: 3 times per week            Contractures Contractures Info: Not present     Additional Factors Info  Code Status, Allergies Code Status Info: full code Allergies Info: Furosemide           Current Medications (01/29/2020):  This is the current hospital active medication list Current Facility-Administered Medications  Medication Dose Route Frequency Provider Last Rate Last Admin  . 0.9 %  sodium chloride infusion  250 mL Intravenous PRN Collier Bullock, MD   Stopped at 01/28/20 0859  . acetaminophen (TYLENOL) tablet 975 mg  975 mg Oral TID Collier Bullock, MD   975 mg at 01/29/20 0947  . amLODipine (NORVASC) tablet 5 mg  5 mg Oral Daily Agbata, Tochukwu, MD   5 mg at 01/29/20 0947  . calcium-vitamin D (OSCAL WITH D) 500-200 MG-UNIT per tablet 2 tablet  2 tablet Oral BID Collier Bullock, MD   2 tablet at 01/29/20 0947  . cefTRIAXone (ROCEPHIN) 1 g in sodium chloride 0.9 % 100 mL IVPB  1 g Intravenous Q24H Agbata, Tochukwu, MD 200 mL/hr at 01/29/20 0955 1 g at 01/29/20 0955  . clopidogrel (PLAVIX) tablet 75 mg  75 mg Oral Daily Agbata, Tochukwu, MD   75 mg at 01/29/20 0948  . dapsone tablet 25 mg  25 mg Oral Once per day on Sun Mon Wed Fri Agbata, Tochukwu, MD      . enoxaparin (LOVENOX) injection 40 mg  40 mg Subcutaneous Q24H Collier Bullock, MD  40 mg at 01/28/20 1120  . ferrous sulfate tablet 325 mg  325 mg Oral Q breakfast Agbata, Tochukwu, MD   325 mg at 01/29/20 0948  . hydrochlorothiazide (MICROZIDE) capsule 12.5 mg  12.5 mg Oral Daily Agbata, Tochukwu, MD   12.5 mg at 01/29/20 0947  . lidocaine (LIDODERM) 5 % 1 patch  1 patch Transdermal Q24H Nolberto Hanlon, MD   1 patch at 01/28/20 1724  . lisinopril (ZESTRIL) tablet 10 mg  10 mg Oral Daily Agbata, Tochukwu, MD   10 mg at 01/29/20 0947  . morphine 2 MG/ML injection 2 mg  2 mg Intravenous Q4H PRN Agbata, Tochukwu, MD      . multivitamin-lutein (OCUVITE-LUTEIN) capsule   Oral BID Agbata, Tochukwu, MD   1 capsule at 01/29/20 0948  . ondansetron (ZOFRAN) tablet 4 mg  4 mg Oral Q6H PRN Agbata, Tochukwu, MD        Or  . ondansetron (ZOFRAN) injection 4 mg  4 mg Intravenous Q6H PRN Agbata, Tochukwu, MD      . pantoprazole (PROTONIX) EC tablet 40 mg  40 mg Oral Daily Agbata, Tochukwu, MD   40 mg at 01/29/20 0947  . polyethylene glycol (MIRALAX / GLYCOLAX) packet 17 g  17 g Oral Daily PRN Agbata, Tochukwu, MD   17 g at 01/27/20 0842  . polyvinyl alcohol (LIQUIFILM TEARS) 1.4 % ophthalmic solution 1 drop  1 drop Both Eyes QID Agbata, Tochukwu, MD   1 drop at 01/29/20 0948  . potassium chloride SA (KLOR-CON) CR tablet 20 mEq  20 mEq Oral Daily Agbata, Tochukwu, MD   20 mEq at 01/29/20 0948  . sodium chloride flush (NS) 0.9 % injection 3 mL  3 mL Intravenous Q12H Agbata, Tochukwu, MD   3 mL at 01/29/20 0955  . sodium chloride flush (NS) 0.9 % injection 3 mL  3 mL Intravenous PRN Agbata, Tochukwu, MD      . tamsulosin (FLOMAX) capsule 0.8 mg  0.8 mg Oral q1800 Agbata, Tochukwu, MD   0.8 mg at 01/28/20 1723     Discharge Medications: Please see discharge summary for a list of discharge medications.  Relevant Imaging Results:  Relevant Lab Results:   Additional Information SS# [id number]  Su Hilt, RN

## 2020-01-29 NOTE — Discharge Summary (Addendum)
Geoffrey Barber IHK:[id number] DOB: 1925/05/27 DOA: 01/26/2020  PCP: Derinda Late, MD  Admit date: 01/26/2020 Discharge date: 01/30/20  Admitted From: SNF Disposition: [place]  Recommendations for Outpatient Follow-up:  1. Follow up with PCP in 1 week 2. Please obtain BMP/CBC in one week 3. Cardiology Dr. Nehemiah Massed in 2 weeks     Discharge Condition:Stable CODE STATUS:Full  Diet recommendation: Heart Healthy, 2gm sodium  Brief/Interim Summary: Geoffrey Barber is a 86 y.o. male with medical history significant for prostate cancer, hypertension, GERD and BPH who presents to the emergency room via EMS after an unwitnessed fall.  Staff reports hearing patient fall in the room, upon entering they noted that the trash can was broken.  Patient states that he tripped and fell striking his right hip and landing on the trash can. He denies hitting his head or any loss of consciousness.  He was noted to have an abrasion/bruise to his right lower back and was able to walk back to bed with assistance.  He was brought into the ER for evaluation of severe right hip pain that is worse with any form of movement.  Patient is unable to ambulate due to the pain.  He also complained of pain in his right rib cage that is worse with deep inspiration and moving. Right hip x-ray showed degenerative changes lumbar spine and both hips. Stable chronic deformity of the pubis bilaterally. No acute abnormality identified. Right hip is intact. Peripheral vascular disease.CT hip Rt: Extensive chronic fractures of the pubic bodies and adjacent   pubic rami with resultant deformities. Although some of thefragments do not have corticated margins, the appearance is not substantially changed from 12/11/2015 and accordingly a new acute component is not observed. 2. No fracture of the acetabulum or right proximal femur is apparent on CT. Was admitted to the hospital service.  [place] it was thought patient EKG revealed atrial  fibrillation however cardiology was consulted and it appeared to be ectopic atrial contractions and no evidence of A. fib or advanced heart block. 7/13 :Patient was unable to be discharged yesterday, no issues overnight, will discharge today.  S/p fall with Acute right hip pain difficulty ambulating Imaging did not show an acute fracture but showsextensive chronic fractures of the pubic bodies and adjacent pubic rami with resultant deformities. Continue with pain management At SNF    E.coli UTI Patient was seen in the emergency room 3 days prior to this hospitalization and had pyuria at that time Urine culture with E. coli.   Started on IV Rocephin but will discharge to p.o. antibiotic to complete course    ?Atrial fibrillation on admission EKG-however none found on telemetry Cards input was appreciated -appears to be ectopic atrial rhythm with preventricular contractions, no evidence of afib or adv. Heart block On telemetry with sinus bradycardia. From cards stand point ok to d/c to SNF  Avoid beta-blockers or other medications that can cause bradycardia Echo with EF 40-45%- medical mx per cards Follow-up with cardiology in 2 weeks as outpatient     Hypertension Stable  continue HCTZ, lisinopril, amlodipine   BPH Continue Flomax    Discharge Diagnoses:  Principal Problem:   Acute right hip pain Active Problems:   Hypertension   Acute lower UTI   Impaired ambulation   BPH without obstruction/lower urinary tract symptoms   Unspecified atrial fibrillation ([place])   Right hip pain   Fall    Discharge Instructions  Discharge Instructions    Call MD for:  severe uncontrolled pain   Complete by: As directed    Call MD for:  temperature >100.4   Complete by: As directed    Diet - low sodium heart healthy   Complete by: As directed    Discharge instructions   Complete by: As directed    Follow-up with PCP in one week Follow-up with cardiology Dr.  Fabio Asa with [facility] in 2 weeks   Increase activity slowly   Complete by: As directed      Allergies as of 01/29/2020      Reactions   Furosemide Rash      Medication List    STOP taking these medications   amoxicillin-clavulanate 875-125 MG tablet Commonly known as: Augmentin   loperamide 2 MG tablet Commonly known as: IMODIUM A-D   potassium chloride SA 20 MEQ tablet Commonly known as: KLOR-CON     TAKE these medications   acetaminophen 325 MG tablet Commonly known as: TYLENOL Take 975 mg by mouth 3 (three) times daily.   amLODipine 5 MG tablet Commonly known as: NORVASC Take 5 mg by mouth daily.   Calcium 600 600 MG Tabs tablet Generic drug: calcium carbonate Take 600 mg by mouth 2 (two) times daily with a meal.   Calcium 600+D 600-400 MG-UNIT tablet Generic drug: Calcium Carbonate-Vitamin D Take 2 tablets by mouth 2 (two) times daily.   carboxymethylcellulose 0.5 % Soln Commonly known as: REFRESH PLUS Place 1 drop into both eyes 4 (four) times daily.   cephALEXin 500 MG capsule Commonly known as: KEFLEX Take 1 capsule (500 mg total) by mouth 3 (three) times daily for 5 days.   clopidogrel 75 MG tablet Commonly known as: PLAVIX Take 75 mg by mouth daily.   dapsone 25 MG tablet Take 25 mg by mouth See admin instructions. Take 1 tablet daily on Sunday, Monday, Wednesday, and Friday.   diclofenac Sodium 1 % Gel Commonly known as: VOLTAREN Apply 2 g topically 2 (two) times daily as needed.   ferrous sulfate 325 (65 FE) MG tablet Take 325 mg by mouth daily with breakfast.   hydrochlorothiazide 12.5 MG capsule Commonly known as: MICROZIDE Take 12.5 mg by mouth daily.   lactobacillus acidophilus Tabs tablet Take 2 tablets by mouth 3 (three) times daily for 14 days.   lidocaine 5 % Commonly known as: LIDODERM Place 1 patch onto the skin daily. Remove & Discard patch within 12 hours or as directed by MD   lisinopril 20 MG tablet Commonly  known as: ZESTRIL Take 10 mg by mouth daily.   omeprazole 20 MG capsule Commonly known as: PRILOSEC Take 20 mg by mouth daily.   ondansetron 4 MG disintegrating tablet Commonly known as: Zofran ODT Take 1 tablet (4 mg total) by mouth every 8 (eight) hours as needed for nausea or vomiting.   polyethylene glycol 17 g packet Commonly known as: MIRALAX / GLYCOLAX Take 17 g by mouth daily as needed for mild constipation.   PRESERVISION AREDS 2+MULTI VIT PO Take 1 capsule by mouth 2 (two) times daily.   tamsulosin 0.4 MG Caps capsule Commonly known as: FLOMAX Take 0.8 mg by mouth daily.   Turmeric Root Powd Take 500 mg by mouth daily.   WHITE PETROLATUM-MINERAL OIL OP Apply 1 application to eye at bedtime.       Follow-up Information    Corey Skains, MD On 02/09/2020.   Specialty: Cardiology Why: at 1130 Contact information: [facility] [place]  [id number] [phone]              Allergies  Allergen Reactions  . Furosemide Rash    Consultations:  Cardiology   Procedures/Studies: DG Ribs Unilateral Right  Result Date: 01/26/2020 CLINICAL DATA:  Right lower back pain after fall today. EXAM: RIGHT RIBS - 2 VIEW COMPARISON:  None. FINDINGS: Possible mildly displaced fracture is seen involving the anterior portion of the right eleventh rib. No pneumothorax or pleural effusion is noted. No other rib abnormality is noted. IMPRESSION: Possible mildly displaced fracture involving the anterior portion of the right eleventh rib. Electronically Signed   By: Marijo Conception M.D.   On: 01/26/2020 08:57   CT Head Wo Contrast  Result Date: 01/26/2020 CLINICAL DATA:  Unwitnessed fall, headache. EXAM: CT HEAD WITHOUT CONTRAST CT CERVICAL SPINE WITHOUT CONTRAST TECHNIQUE: Multidetector CT imaging of the head and cervical spine was performed following the standard protocol without intravenous contrast. Multiplanar CT image  reconstructions of the cervical spine were also generated. COMPARISON:  MRI brain 04/05/2012 FINDINGS: CT HEAD FINDINGS Brain: Periventricular white matter and corona radiata hypodensities favor chronic ischemic microvascular white matter disease. Small remote lacunar infarct of the left caudate head. Otherwise, the brainstem, cerebellum, cerebral peduncles, thalamus, basal ganglia, basilar cisterns, and ventricular system appear within normal limits. Chronic intracranial fatty signal along the anterior falx, considered benign/incidental. Vascular: There is atherosclerotic calcification of the cavernous carotid arteries bilaterally. Mildly ectatic proximal left MCA. Skull: Unremarkable Sinuses/Orbits: Mucosal thickening in the ethmoid air cells compatible with mild chronic sinusitis. Along the upper margin of the left lateral rectus, a 1.3 by 0.4 by 0.4 cm structure with gas density but internal speckling of higher density is present. This slightly flattens the globe's contour. This is an unusual appearance, but this device appears to have been present back on the MRI of 04/05/2012, and this could represent a micropore glaucoma drainage device (GDD) potentially with some physiologic nitrogen gas accumulation. Given the flattening of the contour, I am highly skeptical that this represents venous gas within a varix. Correlate with patient's ophthalmic history. Other: No supplemental non-categorized findings. CT CERVICAL SPINE FINDINGS Alignment: 1.5 mm degenerative anterolisthesis at C4-5 and C6-7. 1.5 mm degenerative retrolisthesis at C5-6. Skull base and vertebrae: Fused facet joint on the right at C3-4. Prominent loss of intervertebral disc height at C5-6 with questionable partial interbody fusion. Posterior partial interbody fusion at C3-4. Fused facet joint on the right at C7-T1. No cervical spine fracture or acute bony finding. Soft tissues and spinal canal: Bilateral carotid bulb atherosclerotic calcification.  Disc levels: Cervical spondylosis contributing to left foraminal impingement at C3-4, C4-5, and C5-6; and right foraminal impingement at C3-4, C4-5, C5-6, and possibly C2-3. Upper chest: Unremarkable Other: No supplemental non-categorized findings. IMPRESSION: 1. No acute intracranial findings or acute cervical spine findings. 2. Periventricular white matter and corona radiata hypodensities favor chronic ischemic microvascular white matter disease. Small remote lacunar infarct of the left caudate head. 3. Along the upper margin of the left lateral rectus, a 1.3 by 0.4 by 0.4 cm structure with gas density but internal speckling of higher density is present. This is an unusual appearance, but this device appears to been present back on the MRI of 04/05/2012, and this could represent a micropore glaucoma drainage device (GDD) potentially with some physiologic nitrogen gas accumulation. Given the flattening of the contour, I am highly skeptical that this represents venous gas within a varix. Correlate with patient's ophthalmic history. 4. Cervical spondylosis and  degenerative disc disease causing multilevel impingement. 5. Atherosclerosis. 6. Mild chronic ethmoid sinusitis. Electronically Signed   By: Van Clines M.D.   On: 01/26/2020 07:02   CT Cervical Spine Wo Contrast  Result Date: 01/26/2020 CLINICAL DATA:  Unwitnessed fall, headache. EXAM: CT HEAD WITHOUT CONTRAST CT CERVICAL SPINE WITHOUT CONTRAST TECHNIQUE: Multidetector CT imaging of the head and cervical spine was performed following the standard protocol without intravenous contrast. Multiplanar CT image reconstructions of the cervical spine were also generated. COMPARISON:  MRI brain 04/05/2012 FINDINGS: CT HEAD FINDINGS Brain: Periventricular white matter and corona radiata hypodensities favor chronic ischemic microvascular white matter disease. Small remote lacunar infarct of the left caudate head. Otherwise, the brainstem, cerebellum, cerebral  peduncles, thalamus, basal ganglia, basilar cisterns, and ventricular system appear within normal limits. Chronic intracranial fatty signal along the anterior falx, considered benign/incidental. Vascular: There is atherosclerotic calcification of the cavernous carotid arteries bilaterally. Mildly ectatic proximal left MCA. Skull: Unremarkable Sinuses/Orbits: Mucosal thickening in the ethmoid air cells compatible with mild chronic sinusitis. Along the upper margin of the left lateral rectus, a 1.3 by 0.4 by 0.4 cm structure with gas density but internal speckling of higher density is present. This slightly flattens the globe's contour. This is an unusual appearance, but this device appears to have been present back on the MRI of 04/05/2012, and this could represent a micropore glaucoma drainage device (GDD) potentially with some physiologic nitrogen gas accumulation. Given the flattening of the contour, I am highly skeptical that this represents venous gas within a varix. Correlate with patient's ophthalmic history. Other: No supplemental non-categorized findings. CT CERVICAL SPINE FINDINGS Alignment: 1.5 mm degenerative anterolisthesis at C4-5 and C6-7. 1.5 mm degenerative retrolisthesis at C5-6. Skull base and vertebrae: Fused facet joint on the right at C3-4. Prominent loss of intervertebral disc height at C5-6 with questionable partial interbody fusion. Posterior partial interbody fusion at C3-4. Fused facet joint on the right at C7-T1. No cervical spine fracture or acute bony finding. Soft tissues and spinal canal: Bilateral carotid bulb atherosclerotic calcification. Disc levels: Cervical spondylosis contributing to left foraminal impingement at C3-4, C4-5, and C5-6; and right foraminal impingement at C3-4, C4-5, C5-6, and possibly C2-3. Upper chest: Unremarkable Other: No supplemental non-categorized findings. IMPRESSION: 1. No acute intracranial findings or acute cervical spine findings. 2. Periventricular  white matter and corona radiata hypodensities favor chronic ischemic microvascular white matter disease. Small remote lacunar infarct of the left caudate head. 3. Along the upper margin of the left lateral rectus, a 1.3 by 0.4 by 0.4 cm structure with gas density but internal speckling of higher density is present. This is an unusual appearance, but this device appears to been present back on the MRI of 04/05/2012, and this could represent a micropore glaucoma drainage device (GDD) potentially with some physiologic nitrogen gas accumulation. Given the flattening of the contour, I am highly skeptical that this represents venous gas within a varix. Correlate with patient's ophthalmic history. 4. Cervical spondylosis and degenerative disc disease causing multilevel impingement. 5. Atherosclerosis. 6. Mild chronic ethmoid sinusitis. Electronically Signed   By: Van Clines M.D.   On: 01/26/2020 07:02   CT ABDOMEN PELVIS W CONTRAST  Result Date: 01/22/2020 CLINICAL DATA:  Abdominal abscess/infection suspected. Diverticulitis suspected. Abdominal pain. Free air on chest x-ray. Hematuria since last night. EXAM: CT ABDOMEN AND PELVIS WITH CONTRAST TECHNIQUE: Multidetector CT imaging of the abdomen and pelvis was performed using the standard protocol following bolus administration of intravenous contrast. CONTRAST:  53mL OMNIPAQUE  IOHEXOL 300 MG/ML  SOLN COMPARISON:  Chest x-ray on 01/22/2020, CT of the abdomen and pelvis on 12/11/2015 FINDINGS: Lower chest: There is streaky subsegmental atelectasis at the lung bases. Coronary artery calcifications are present. Heart is mildly enlarged. Hepatobiliary: No free intraperitoneal air. The hepatic flexure of the colon is interposed between the liver and the hemidiaphragm. Gallbladder is present. Small liver cysts are 5 millimeters or smaller. No suspicious liver lesion. Pancreas: Unremarkable. No pancreatic ductal dilatation or surrounding inflammatory changes. Spleen:  Normal in size without focal abnormality. Adrenals/Urinary Tract: Adrenal glands are normal in appearance. RIGHT renal cyst is 7.2 x 6.0 centimeters. No intrarenal calculi. No hydronephrosis. Ureters are mildly dilated bilaterally. There is marked thickening of the urinary bladder wall, a new finding since prior study. Along the posterior aspect of the urinary bladder wall, the wall is thickened and irregular, measuring up to 2.2 centimeters. Stomach/Bowel: Small hiatal hernia. Stomach is normal in appearance otherwise. Small bowel loops are normal in caliber and wall thickness. There is moderate stool burden throughout normal appearing loops of colon. The appendix is well seen and has a normal appearance. Vascular/Lymphatic: There is atherosclerotic calcification of the abdominal aorta not associated with aneurysm. No retroperitoneal or mesenteric adenopathy. Reproductive: Prostate is unremarkable. Other: No ascites.  Anterior abdominal wall is unremarkable. Musculoskeletal: Chronic deformity of the superior and inferior pubic rami bilaterally. Moderate degenerative changes throughout the visualized thoracic and lumbar spine. Vacuum disc phenomenon at multiple levels. IMPRESSION: 1. Marked thickening of the urinary bladder wall, a new finding since prior study. Along the posterior aspect of the urinary bladder wall, the wall is thickened and irregular, measuring up to 2.2 centimeters. Findings are suspicious for malignancy. Recommend further evaluation with cystoscopy. Is 2. Mild bilateral hydroureter, likely related to the urinary bladder process. 3. RIGHT renal cyst. 4. Small hiatal hernia. 5. Coronary artery disease. 6. Chronic deformity of the superior and inferior pubic rami bilaterally. 7. Moderate stool burden. 8. Degenerative changes in the visualized thoracic and lumbar spine. 9. Aortic Atherosclerosis (ICD10-I70.0). Electronically Signed   By: Nolon Nations M.D.   On: 01/22/2020 13:25   CT Hip Right  Wo Contrast  Result Date: 01/26/2020 CLINICAL DATA:  Unwitnessed fall, right hip pain. EXAM: CT OF THE RIGHT HIP WITHOUT CONTRAST TECHNIQUE: Multidetector CT imaging of the right hip was performed according to the standard protocol. Multiplanar CT image reconstructions were also generated. COMPARISON:  Radiograph 01/26/2020 FINDINGS: Bones/Joint/Cartilage Extensive chronic fractures of the pubic bodies and adjacent pubic rami with resultant deformities. Although some of the fragments do not have corticated margins, the appearance is not substantially changed from 12/11/2015 and accordingly a new acute component is not observed. There is spurring of the right femoral head but without a well-defined right proximal femoral fracture. Ligaments Suboptimally assessed by CT. Muscles and Tendons Thickened right iliotibial band along the hip, although not changed from 01/22/2020, with stable mild overlying subcutaneous edema. Atrophic gluteus medius and gluteus minimus muscles. Soft tissues Stable subtle presacral edema. Right common femoral artery atherosclerotic calcification. IMPRESSION: 1. Extensive chronic fractures of the pubic bodies and adjacent pubic rami with resultant deformities. Although some of the fragments do not have corticated margins, the appearance is not substantially changed from 12/11/2015 and accordingly a new acute component is not observed. 2. No fracture of the acetabulum or right proximal femur is apparent on CT. 3. Thickened right iliotibial band along the hip, although not changed from 01/22/2020, with stable mild overlying subcutaneous edema. 4. Atrophic  gluteus medius and gluteus minimus muscles. 5. Right common femoral artery atherosclerotic calcification. Electronically Signed   By: Van Clines M.D.   On: 01/26/2020 07:12   US Venous Img Lower Unilateral Left (DVT)  Result Date: 01/10/2020 CLINICAL DATA:  Left lower extremity pain and edema. History of previous DVT. History of  prostate cancer. Evaluate for acute or chronic DVT. EXAM: LEFT LOWER EXTREMITY VENOUS DOPPLER ULTRASOUND TECHNIQUE: Gray-scale sonography with graded compression, as well as color Doppler and duplex ultrasound were performed to evaluate the lower extremity deep venous systems from the level of the common femoral vein and including the common femoral, femoral, profunda femoral, popliteal and calf veins including the posterior tibial, peroneal and gastrocnemius veins when visible. The superficial great saphenous vein was also interrogated. Spectral Doppler was utilized to evaluate flow at rest and with distal augmentation maneuvers in the common femoral, femoral and popliteal veins. COMPARISON:  Bilateral lower extremity venous Doppler ultrasound-12/27/2015 (negative). FINDINGS: Contralateral Common Femoral Vein: Respiratory phasicity is normal and symmetric with the symptomatic side. No evidence of thrombus. Normal compressibility. Common Femoral Vein: No evidence of thrombus. Normal compressibility, respiratory phasicity and response to augmentation. Saphenofemoral Junction: No evidence of thrombus. Normal compressibility and flow on color Doppler imaging. Profunda Femoral Vein: No evidence of thrombus. Normal compressibility and flow on color Doppler imaging. Femoral Vein: No evidence of thrombus. Normal compressibility, respiratory phasicity and response to augmentation. Popliteal Vein: No evidence of thrombus. Normal compressibility, respiratory phasicity and response to augmentation. Calf Veins: No evidence of thrombus. Normal compressibility and flow on color Doppler imaging. Superficial Great Saphenous Vein: No evidence of thrombus. Normal compressibility. Venous Reflux:  None. Other Findings: There is a minimal amount of subcutaneous edema at the level of the left calf (image 39). IMPRESSION: No evidence of acute or chronic DVT within the left lower extremity. Electronically Signed   By: Sandi Mariscal M.D.    On: 01/10/2020 16:59   DG Chest Portable 1 View  Result Date: 01/26/2020 CLINICAL DATA:  Chest pain. EXAM: PORTABLE CHEST 1 VIEW COMPARISON:  Chest x-ray 01/22/2020.  CT abdomen 01/22/2020. FINDINGS: Mediastinum hilar structures normal. Heart size stable. Low lung volumes with mild bibasilar subsegmental atelectasis. No pleural effusion or pneumothorax. Prominent skin fold on the left. Degenerative change and scoliosis thoracic spine. Degenerative changes both shoulders. No air noted under the hemidiaphragms as previously questioned on prior study. IMPRESSION: 1.  Low lung volumes with mild bibasilar subsegmental atelectasis. 2. No air noted under the hemidiaphragms as previously questioned prior chest x-ray of 01/22/2020. Electronically Signed   By: Marcello Moores  Register   On: 01/26/2020 06:08   DG Chest Portable 1 View  Result Date: 01/22/2020 CLINICAL DATA:  Shortness of breath EXAM: PORTABLE CHEST 1 VIEW COMPARISON:  January 01, 2016 FINDINGS: The mediastinal contour is stable. Heart size is enlarged. Mild opacity of left lung base is identified probably due to atelectasis. The right lung is clear. Air is identified beneath the right hemidiaphragm. Degenerative joint changes of bilateral shoulders are identified. IMPRESSION: Probable atelectasis of left lung base. Air beneath the right hemidiaphragm suspicious for free air. Further evaluation with a CT of abdomen and pelvis is recommended. These results were called by telephone at the time of interpretation on 01/22/2020 at 11:34 am to provider PHILLIP STAFFORD , who verbally acknowledged these results. Electronically Signed   By: Abelardo Diesel M.D.   On: 01/22/2020 11:35   ECHOCARDIOGRAM COMPLETE  Result Date: 01/26/2020    ECHOCARDIOGRAM REPORT  Patient Name:   Geoffrey Barber Date of Exam: 01/26/2020 Medical Rec #:  [id number]       Height:       72.0 in Accession #:    [id number]      Weight:       220.0 lb Date of Birth:  1925/06/11       BSA:          2.219 m  Patient Age:    86 years        BP:           128/98 mmHg Patient Gender: M               HR:           73 bpm. Exam Location:  [facility] Procedure: 2D Echo, Cardiac Doppler and Color Doppler Indications:     Atrial Fibrillation 427.31  History:         Patient has prior history of Echocardiogram examinations, most                  recent 12/27/2015. Risk Factors:Hypertension. Pulmonary embolism,                  pneumonia.  Sonographer:     Sherrie Sport RDCS (AE) Referring Phys:  [place] Diagnosing Phys: Serafina Royals MD IMPRESSIONS  1. Left ventricular ejection fraction, by estimation, is 40 to 45%. The left ventricle has mildly decreased function. The left ventricle demonstrates global hypokinesis. Left ventricular diastolic parameters were normal.  2. Right ventricular systolic function is normal. The right ventricular size is normal. There is normal pulmonary artery systolic pressure.  3. Left atrial size was mildly dilated.  4. The mitral valve is normal in structure. Mild mitral valve regurgitation.  5. The aortic valve is normal in structure. Aortic valve regurgitation is not visualized. FINDINGS  Left Ventricle: Left ventricular ejection fraction, by estimation, is 40 to 45%. The left ventricle has mildly decreased function. The left ventricle demonstrates global hypokinesis. The left ventricular internal cavity size was normal in size. There is  no left ventricular hypertrophy. Left ventricular diastolic parameters were normal. Right Ventricle: The right ventricular size is normal. No increase in right ventricular wall thickness. Right ventricular systolic function is normal. There is normal pulmonary artery systolic pressure. The tricuspid regurgitant velocity is 1.59 m/s, and  with an assumed right atrial pressure of 10 mmHg, the estimated right ventricular systolic pressure is 87.6 mmHg. Left Atrium: Left atrial size was mildly dilated. Right Atrium: Right atrial size was normal in size.  Pericardium: There is no evidence of pericardial effusion. Mitral Valve: The mitral valve is normal in structure. Mild mitral valve regurgitation. Tricuspid Valve: The tricuspid valve is normal in structure. Tricuspid valve regurgitation is mild. Aortic Valve: The aortic valve is normal in structure. Aortic valve regurgitation is not visualized. Aortic valve mean gradient measures 2.0 mmHg. Aortic valve peak gradient measures 3.4 mmHg. Aortic valve area, by VTI measures 3.79 cm. Pulmonic Valve: The pulmonic valve was normal in structure. Pulmonic valve regurgitation is not visualized. Aorta: The aortic root and ascending aorta are structurally normal, with no evidence of dilitation. IAS/Shunts: No atrial level shunt detected by color flow Doppler.  LEFT VENTRICLE PLAX 2D LVIDd:         3.46 cm  Diastology LVIDs:         2.39 cm  LV e' lateral:   8.92 cm/s LV PW:  0.98 cm  LV E/e' lateral: 6.5 LV IVS:        1.33 cm  LV e' medial:    8.05 cm/s LVOT diam:     2.20 cm  LV E/e' medial:  7.2 LV SV:         75 LV SV Index:   34 LVOT Area:     3.80 cm  RIGHT VENTRICLE RV Basal diam:  3.94 cm RV S prime:     14.80 cm/s TAPSE (M-mode): 3.4 cm LEFT ATRIUM             Index       RIGHT ATRIUM           Index LA diam:        3.90 cm 1.76 cm/m  RA Area:     21.80 cm LA Vol (A2C):   59.0 ml 26.59 ml/m RA Volume:   65.80 ml  29.66 ml/m LA Vol (A4C):   35.9 ml 16.18 ml/m LA Biplane Vol: 44.9 ml 20.24 ml/m  AORTIC VALVE                   PULMONIC VALVE AV Area (Vmax):    3.73 cm    PV Vmax:       0.79 m/s AV Area (Vmean):   3.05 cm    PV Peak grad:  2.5 mmHg AV Area (VTI):     3.79 cm AV Vmax:           92.55 cm/s AV Vmean:          61.750 cm/s AV VTI:            0.198 m AV Peak Grad:      3.4 mmHg AV Mean Grad:      2.0 mmHg LVOT Vmax:         90.90 cm/s LVOT Vmean:        49.500 cm/s LVOT VTI:          0.197 m LVOT/AV VTI ratio: 1.00  AORTA Ao Root diam: 3.40 cm MITRAL VALVE               TRICUSPID VALVE MV Area  (PHT): 2.02 cm    TR Peak grad:   10.1 mmHg MV Decel Time: 375 msec    TR Vmax:        159.00 cm/s MV E velocity: 57.70 cm/s MV A velocity: 99.40 cm/s  SHUNTS MV E/A ratio:  0.58        Systemic VTI:  0.20 m                            Systemic Diam: 2.20 cm Serafina Royals MD Electronically signed by Serafina Royals MD Signature Date/Time: 01/26/2020/7:07:30 PM    Final    DG Hip Unilat W or Wo Pelvis 2-3 Views Right  Result Date: 01/26/2020 CLINICAL DATA:  Fall. EXAM: DG HIP (WITH OR WITHOUT PELVIS) 2-3V RIGHT COMPARISON:  CT 01/22/2020, 12/11/2015. FINDINGS: Degenerative change lumbar spine and both hips. Stable chronic deformity noted of the pubis bilaterally. No acute bony or joint abnormality identified. No evidence of acute fracture or dislocation. Right hip is intact. Peripheral vascular calcification. IMPRESSION: 1. Degenerative changes lumbar spine and both hips. Stable chronic deformity of the pubis bilaterally. 2.  No acute abnormality identified.  Right hip is intact. 3.  Peripheral vascular disease. Electronically Signed   By: Marcello Moores  Register  On: 01/26/2020 06:11      Subjective: No compliants this am.   Discharge Exam: Vitals:   01/29/20 0056 01/29/20 0801  BP: 133/77 129/64  Pulse: 63 (!) 58  Resp: 19 18  Temp: 97.7 F (36.5 C) 97.7 F (36.5 C)  SpO2: 94% 96%   Vitals:   01/28/20 0741 01/28/20 1739 01/29/20 0056 01/29/20 0801  BP: 130/73 122/63 133/77 129/64  Pulse: 61 62 63 (!) 58  Resp: 18 20 19 18   Temp: 97.7 F (36.5 C) 97.9 F (36.6 C) 97.7 F (36.5 C) 97.7 F (36.5 C)  TempSrc: Oral Oral Oral Oral  SpO2: 96% 99% 94% 96%  Weight:      Height:        General: Pt is alert, awake, not in acute distress Cardiovascular: RRR, S1/S2 +, no rubs, no gallops Respiratory: CTA bilaterally, no wheezing, no rhonchi Abdominal: Soft, NT, ND, bowel sounds + Extremities: no edema, no cyanosis    The results of significant diagnostics from this hospitalization  (including imaging, microbiology, ancillary and laboratory) are listed below for reference.     Microbiology: Recent Results (from the past 240 hour(s))  Urine culture     Status: Abnormal   Collection Time: 01/22/20 10:43 AM   Specimen: Urine, Random  Result Value Ref Range Status   Specimen Description   Final    URINE, RANDOM Performed at [facility], [address] [place], [place] [address]    Special Requests   Final    NONE Performed at [facility], [place], [place] [address]    Culture >=100,000 COLONIES/mL ESCHERICHIA COLI (A)  Final   Report Status 01/25/2020 FINAL  Final   Organism ID, Bacteria ESCHERICHIA COLI (A)  Final      Susceptibility   Escherichia coli - MIC*    AMPICILLIN 4 SENSITIVE Sensitive     CEFAZOLIN <=4 SENSITIVE Sensitive     CEFTRIAXONE <=0.25 SENSITIVE Sensitive     CIPROFLOXACIN <=0.25 SENSITIVE Sensitive     GENTAMICIN <=1 SENSITIVE Sensitive     IMIPENEM <=0.25 SENSITIVE Sensitive     NITROFURANTOIN <=16 SENSITIVE Sensitive     TRIMETH/SULFA <=20 SENSITIVE Sensitive     AMPICILLIN/SULBACTAM <=2 SENSITIVE Sensitive     PIP/TAZO <=4 SENSITIVE Sensitive     * >=100,000 COLONIES/mL ESCHERICHIA COLI  SARS Coronavirus 2 by RT PCR (hospital order, performed in [place] hospital lab) Nasopharyngeal Nasopharyngeal Swab     Status: None   Collection Time: 01/22/20 12:02 PM   Specimen: Nasopharyngeal Swab  Result Value Ref Range Status   SARS Coronavirus 2 NEGATIVE NEGATIVE Final    Comment: (NOTE) SARS-CoV-2 target nucleic acids are NOT DETECTED.  The SARS-CoV-2 RNA is generally detectable in upper and lower respiratory specimens during the acute phase of infection. The lowest concentration of SARS-CoV-2 viral copies this assay can detect is 250 copies / mL. A negative result does not preclude SARS-CoV-2 infection and should not be used as the sole basis for treatment or other patient management decisions.   A negative result may occur with improper specimen collection / handling, submission of specimen other than nasopharyngeal swab, presence of viral mutation(s) within the areas targeted by this assay, and inadequate number of viral copies (<250 copies / mL). A negative result must be combined with clinical observations, patient history, and epidemiological information.  Fact Sheet for Patients:   StrictlyIdeas.no  Fact Sheet for Healthcare Providers: BankingDealers.co.za  This test is not yet  approved or  cleared by the [place] and has been authorized for detection and/or diagnosis of SARS-CoV-2 by FDA under an Emergency Use Authorization (EUA).  This EUA will remain in effect (meaning this test can be used) for the duration of the COVID-19 declaration under Section 564(b)(1) of the Act, 21 U.S.C. section 360bbb-3(b)(1), unless the authorization is terminated or revoked sooner.  Performed at [facility], [address] [place], [place] [address]   SARS Coronavirus 2 by RT PCR (hospital order, performed in [facility] hospital lab) Nasopharyngeal Nasopharyngeal Swab     Status: None   Collection Time: 01/26/20  7:44 AM   Specimen: Nasopharyngeal Swab  Result Value Ref Range Status   SARS Coronavirus 2 NEGATIVE NEGATIVE Final    Comment: (NOTE) SARS-CoV-2 target nucleic acids are NOT DETECTED.  The SARS-CoV-2 RNA is generally detectable in upper and lower respiratory specimens during the acute phase of infection. The lowest concentration of SARS-CoV-2 viral copies this assay can detect is 250 copies / mL. A negative result does not preclude SARS-CoV-2 infection and should not be used as the sole basis for treatment or other patient management decisions.  A negative result may occur with improper specimen collection / handling, submission of specimen other than nasopharyngeal swab, presence of viral mutation(s)  within the areas targeted by this assay, and inadequate number of viral copies (<250 copies / mL). A negative result must be combined with clinical observations, patient history, and epidemiological information.  Fact Sheet for Patients:   StrictlyIdeas.no  Fact Sheet for Healthcare Providers: BankingDealers.co.za  This test is not yet approved or  cleared by the [place] FDA and has been authorized for detection and/or diagnosis of SARS-CoV-2 by FDA under an Emergency Use Authorization (EUA).  This EUA will remain in effect (meaning this test can be used) for the duration of the COVID-19 declaration under Section 564(b)(1) of the Act, 21 U.S.C. section 360bbb-3(b)(1), unless the authorization is terminated or revoked sooner.  Performed at [facility], [address] [place], [place] [address]      Labs: BNP (last 3 results) No results for input(s): BNP in the last 8760 hours. Basic Metabolic Panel: Recent Labs  Lab 01/26/20 0446 01/27/20 0423  NA 141 138  K 3.9 4.4  CL 108 106  CO2 24 26  GLUCOSE 126* 111*  BUN 58* 52*  CREATININE 1.50* 1.25*  CALCIUM 9.4 9.1   Liver Function Tests: Recent Labs  Lab 01/26/20 0446  AST 29  ALT 20  ALKPHOS 46  BILITOT 0.9  PROT 6.9  ALBUMIN 4.1   No results for input(s): LIPASE, AMYLASE in the last 168 hours. No results for input(s): AMMONIA in the last 168 hours. CBC: Recent Labs  Lab 01/26/20 0446 01/27/20 0423  WBC 8.3 6.0  NEUTROABS 5.7  --   HGB 10.6* 9.3*  HCT 31.3* 26.7*  MCV 95.1 93.0  PLT 229 207   Cardiac Enzymes: No results for input(s): CKTOTAL, CKMB, CKMBINDEX, TROPONINI in the last 168 hours. BNP: Invalid input(s): POCBNP CBG: No results for input(s): GLUCAP in the last 168 hours. D-Dimer No results for input(s): DDIMER in the last 72 hours. Hgb A1c No results for input(s): HGBA1C in the last 72 hours. Lipid Profile No results for  input(s): CHOL, HDL, LDLCALC, TRIG, CHOLHDL, LDLDIRECT in the last 72 hours. Thyroid function studies No results for input(s): TSH, T4TOTAL, T3FREE, THYROIDAB in the last 72 hours.  Invalid input(s): FREET3 Anemia work  up No results for input(s): VITAMINB12, FOLATE, FERRITIN, TIBC, IRON, RETICCTPCT in the last 72 hours. Urinalysis    Component Value Date/Time   COLORURINE RED (A) 01/22/2020 1043   APPEARANCEUR TURBID (A) 01/22/2020 1043   APPEARANCEUR Clear 01/23/2016 0959   LABSPEC 1.021 01/22/2020 1043   LABSPEC 1.009 02/24/2012 0716   PHURINE  01/22/2020 1043    TEST NOT REPORTED DUE TO COLOR INTERFERENCE OF URINE PIGMENT   GLUCOSEU (A) 01/22/2020 1043    TEST NOT REPORTED DUE TO COLOR INTERFERENCE OF URINE PIGMENT   GLUCOSEU Negative 02/24/2012 0716   HGBUR (A) 01/22/2020 1043    TEST NOT REPORTED DUE TO COLOR INTERFERENCE OF URINE PIGMENT   BILIRUBINUR (A) 01/22/2020 1043    TEST NOT REPORTED DUE TO COLOR INTERFERENCE OF URINE PIGMENT   BILIRUBINUR Negative 01/23/2016 0959   BILIRUBINUR Negative 02/24/2012 0716   KETONESUR (A) 01/22/2020 1043    TEST NOT REPORTED DUE TO COLOR INTERFERENCE OF URINE PIGMENT   PROTEINUR (A) 01/22/2020 1043    TEST NOT REPORTED DUE TO COLOR INTERFERENCE OF URINE PIGMENT   NITRITE (A) 01/22/2020 1043    TEST NOT REPORTED DUE TO COLOR INTERFERENCE OF URINE PIGMENT   LEUKOCYTESUR (A) 01/22/2020 1043    TEST NOT REPORTED DUE TO COLOR INTERFERENCE OF URINE PIGMENT   LEUKOCYTESUR Negative 02/24/2012 0716   Sepsis Labs Invalid input(s): PROCALCITONIN,  WBC,  LACTICIDVEN Microbiology Recent Results (from the past 240 hour(s))  Urine culture     Status: Abnormal   Collection Time: 01/22/20 10:43 AM   Specimen: Urine, Random  Result Value Ref Range Status   Specimen Description   Final    URINE, RANDOM Performed at [facility], [address] [place], [place] [address]    Special Requests   Final    NONE Performed at [facility], [address] [place], [place] [address]    Culture >=100,000 COLONIES/mL ESCHERICHIA COLI (A)  Final   Report Status 01/25/2020 FINAL  Final   Organism ID, Bacteria ESCHERICHIA COLI (A)  Final      Susceptibility   Escherichia coli - MIC*    AMPICILLIN 4 SENSITIVE Sensitive     CEFAZOLIN <=4 SENSITIVE Sensitive     CEFTRIAXONE <=0.25 SENSITIVE Sensitive     CIPROFLOXACIN <=0.25 SENSITIVE Sensitive     GENTAMICIN <=1 SENSITIVE Sensitive     IMIPENEM <=0.25 SENSITIVE Sensitive     NITROFURANTOIN <=16 SENSITIVE Sensitive     TRIMETH/SULFA <=20 SENSITIVE Sensitive     AMPICILLIN/SULBACTAM <=2 SENSITIVE Sensitive     PIP/TAZO <=4 SENSITIVE Sensitive     * >=100,000 COLONIES/mL ESCHERICHIA COLI  SARS Coronavirus 2 by RT PCR (hospital order, performed in [place] hospital lab) Nasopharyngeal Nasopharyngeal Swab     Status: None   Collection Time: 01/22/20 12:02 PM   Specimen: Nasopharyngeal Swab  Result Value Ref Range Status   SARS Coronavirus 2 NEGATIVE NEGATIVE Final    Comment: (NOTE) SARS-CoV-2 target nucleic acids are NOT DETECTED.  The SARS-CoV-2 RNA is generally detectable in upper and lower respiratory specimens during the acute phase of infection. The lowest concentration of SARS-CoV-2 viral copies this assay can detect is 250 copies / mL. A negative result does not preclude SARS-CoV-2 infection and should not be used as the sole basis for treatment or other patient management decisions.  A negative result may occur with improper specimen collection / handling, submission of specimen other than nasopharyngeal swab, presence of viral mutation(s) within  the areas targeted by this assay, and inadequate number of viral copies (<250 copies / mL). A negative result must be combined with clinical observations, patient history, and epidemiological information.  Fact Sheet for Patients:   StrictlyIdeas.no  Fact Sheet for Healthcare  Providers: BankingDealers.co.za  This test is not yet approved or  cleared by the [place] FDA and has been authorized for detection and/or diagnosis of SARS-CoV-2 by FDA under an Emergency Use Authorization (EUA).  This EUA will remain in effect (meaning this test can be used) for the duration of the COVID-19 declaration under Section 564(b)(1) of the Act, 21 U.S.C. section 360bbb-3(b)(1), unless the authorization is terminated or revoked sooner.  Performed at [facility], [address] [place],  [address]   SARS Coronavirus 2 by RT PCR (hospital order, performed in [facility] hospital lab) Nasopharyngeal Nasopharyngeal Swab     Status: None   Collection Time: 01/26/20  7:44 AM   Specimen: Nasopharyngeal Swab  Result Value Ref Range Status   SARS Coronavirus 2 NEGATIVE NEGATIVE Final    Comment: (NOTE) SARS-CoV-2 target nucleic acids are NOT DETECTED.  The SARS-CoV-2 RNA is generally detectable in upper and lower respiratory specimens during the acute phase of infection. The lowest concentration of SARS-CoV-2 viral copies this assay can detect is 250 copies / mL. A negative result does not preclude SARS-CoV-2 infection and should not be used as the sole basis for treatment or other patient management decisions.  A negative result may occur with improper specimen collection / handling, submission of specimen other than nasopharyngeal swab, presence of viral mutation(s) within the areas targeted by this assay, and inadequate number of viral copies (<250 copies / mL). A negative result must be combined with clinical observations, patient history, and epidemiological information.  Fact Sheet for Patients:   StrictlyIdeas.no  Fact Sheet for Healthcare Providers: BankingDealers.co.za  This test is not yet approved or  cleared by the [place] FDA and has been authorized for detection  and/or diagnosis of SARS-CoV-2 by FDA under an Emergency Use Authorization (EUA).  This EUA will remain in effect (meaning this test can be used) for the duration of the COVID-19 declaration under Section 564(b)(1) of the Act, 21 U.S.C. section 360bbb-3(b)(1), unless the authorization is terminated or revoked sooner.  Performed at [facility], [address] [place],  [address]      Time coordinating discharge: Over 30 minutes  SIGNED:   Nolberto Hanlon, MD  [facility] 01/29/2020, 11:21 AM Pager   If 7PM-7AM, please contact night-coverage [email or web address] Password [email or web address]

## 2020-01-29 NOTE — Progress Notes (Signed)
Physical Therapy Treatment Patient Details Name: Geoffrey Barber MRN: [id number] DOB: Sep 16, 1924 Today's Date: 01/29/2020    History of Present Illness presented to ER and admitted for acute hospitalization s/p to mechanical fall in home environment with acute set of R hip pain. Imaging negative for acute R hip orthopedic injury; does indicate extensive chronic fractures to pelvis and R 11th rib fracture.    PT Comments    Motivated to participate.  Supine to sit EOB.  Cues to pull up on bed but reaches for staff despite cues.  Steady in sitting.  Stood to [employer] with mod a x 2 with post lean on bed.  He is then able to gain balance and transfer to recliner at bedside.  Participated in exercises as described below.  Stood again and was able to progress gait 8' with heavy reliance on walker.  Very slow effortful steps but max effort given by pt.  He fatigues and needs cues to sit and rest.  Sits quickly in chair with poor control.  Guided safely by staff.  Recliner follow for safety. Remained in chair after session.   Follow Up Recommendations  SNF     Equipment Recommendations       Recommendations for Other Services       Precautions / Restrictions Precautions Precautions: Fall Restrictions Weight Bearing Restrictions: No    Mobility  Bed Mobility Overal bed mobility: Needs Assistance Bed Mobility: Supine to Sit     Supine to sit: Min assist;+2 for physical assistance     General bed mobility comments: reaches for hand to assist and pulls on staff  Transfers Overall transfer level: Needs assistance Equipment used: Rolling walker (2 wheeled) Transfers: Sit to/from Stand Sit to Stand: +2 physical assistance;Mod assist            Ambulation/Gait Ambulation/Gait assistance: Min assist;+2 physical assistance Gait Distance (Feet): 8 Feet Assistive device: Rolling walker (2 wheeled) Gait Pattern/deviations: Step-to pattern Gait velocity: decreased   General Gait  Details: heavy reliance on walker with slow step to gait pattern.  Generally steady but sits quickly when fatigued   Stairs             Wheelchair Mobility    Modified Rankin (Stroke Patients Only)       Balance Overall balance assessment: Needs assistance Sitting-balance support: No upper extremity supported;Feet supported Sitting balance-Leahy Scale: Good     Standing balance support: Bilateral upper extremity supported Standing balance-Leahy Scale: Poor                              Cognition Arousal/Alertness: Awake/alert Behavior During Therapy: WFL for tasks assessed/performed Overall Cognitive Status: Within Functional Limits for tasks assessed                                        Exercises Other Exercises Other Exercises: BLE AROM LAQ, marches and ankle pumps x 10    General Comments        Pertinent Vitals/Pain Pain Assessment: No/denies pain    Home Living                      Prior Function            PT Goals (current goals can now be found in the care plan section) Progress towards PT goals:  Progressing toward goals    Frequency    Min 2X/week      PT Plan Current plan remains appropriate    Co-evaluation              AM-PAC PT "6 Clicks" Mobility   Outcome Measure  Help needed turning from your back to your side while in a flat bed without using bedrails?: A Lot Help needed moving from lying on your back to sitting on the side of a flat bed without using bedrails?: A Lot Help needed moving to and from a bed to a chair (including a wheelchair)?: A Lot Help needed standing up from a chair using your arms (e.g., wheelchair or bedside chair)?: A Lot Help needed to walk in hospital room?: A Lot Help needed climbing 3-5 steps with a railing? : Total 6 Click Score: 11    End of Session Equipment Utilized During Treatment: Gait belt Activity Tolerance: Patient tolerated treatment  well Patient left: with call bell/phone within reach;in chair;with chair alarm set Nurse Communication: Mobility status       Time: 5670-1410 PT Time Calculation (min) (ACUTE ONLY): 16 min  Charges:  $Gait Training: 8-22 mins                    Chesley Noon, PTA 01/29/20, 4:43 PM

## 2020-01-30 MED ORDER — RISAQUAD PO CAPS: 2.0000 | ORAL_CAPSULE | Freq: Three times a day (TID) | ORAL | Status: DC

## 2020-01-30 NOTE — Care Management Important Message (Signed)
Important Message  Patient Details  Name: BRONCO MCGRORY MRN: [id number] Date of Birth: 1925/05/08   Medicare Important Message Given:  Yes     Juliann Pulse A Rilen Shukla 01/30/2020, 9:12 AM

## 2020-01-30 NOTE — Progress Notes (Signed)
EMS here to transport pt, belongings sent with pt.  Pts wife [name] notified EMS here to transport pt.

## 2020-01-30 NOTE — TOC Progression Note (Signed)
Transition of Care [facility]) - Progression Note    Patient Details  Name: Geoffrey Barber MRN: [id number] Date of Birth: 1925-03-17  Transition of Care [facility]) CM/SW [place], RN Phone Number: 01/30/2020, 10:23 AM   Clinical Narrative:   DC packet on the chart for Patient to go to [facility] room 354, The bedside nurse aware and is to call report, The patient's wife aware of the patient discharging today,TOC CM called EMS for transport    Expected Discharge Plan: [place]    Expected Discharge Plan and Services Expected Discharge Plan: [place] In-house Referral: Clinical Social Work     Living arrangements for the past 2 months: [place] ([place]) Expected Discharge Date: 01/30/20                                     Social Determinants of Health (SDOH) Interventions    Readmission Risk Interventions No flowsheet data found.

## 2020-01-30 NOTE — Progress Notes (Signed)
Report called to [facility], EMS called (by Education officer, museum).

## 2020-02-06 ENCOUNTER — Other Ambulatory Visit
Admission: RE | Admit: 2020-02-06 | Discharge: 2020-02-06 | Disposition: A | Discharge status: Home / Self Care | Payer: No Typology Code available for payment source | Source: Ambulatory Visit | Attending: Internal Medicine | Admitting: Internal Medicine

## 2020-02-06 DIAGNOSIS — N400 Enlarged prostate without lower urinary tract symptoms: Secondary | ICD-10-CM | POA: Insufficient documentation

## 2020-02-06 DIAGNOSIS — I10 Essential (primary) hypertension: Secondary | ICD-10-CM | POA: Insufficient documentation

## 2020-02-06 DIAGNOSIS — I4891 Unspecified atrial fibrillation: Secondary | ICD-10-CM | POA: Insufficient documentation

## 2020-02-06 LAB — CBC WITH DIFFERENTIAL/PLATELET
Abs Immature Granulocytes: 0.03 10*3/uL (ref 0.00–0.07)
Basophils Absolute: 0 10*3/uL (ref 0.0–0.1)
Basophils Relative: 0 %
Eosinophils Absolute: 0.1 10*3/uL (ref 0.0–0.5)
Eosinophils Relative: 2 %
HCT: 28.6 % — ABNORMAL LOW (ref 39.0–52.0)
Hemoglobin: 9.4 g/dL — ABNORMAL LOW (ref 13.0–17.0)
Immature Granulocytes: 1 %
Lymphocytes Relative: 26 %
Lymphs Abs: 1.5 10*3/uL (ref 0.7–4.0)
MCH: 32.8 pg (ref 26.0–34.0)
MCHC: 32.9 g/dL (ref 30.0–36.0)
MCV: 99.7 fL (ref 80.0–100.0)
Monocytes Absolute: 0.8 10*3/uL (ref 0.1–1.0)
Monocytes Relative: 15 %
Neutro Abs: 3.3 10*3/uL (ref 1.7–7.7)
Neutrophils Relative %: 56 %
Platelets: 301 10*3/uL (ref 150–400)
RBC: 2.87 MIL/uL — ABNORMAL LOW (ref 4.22–5.81)
RDW: 16.2 % — ABNORMAL HIGH (ref 11.5–15.5)
WBC: 5.8 10*3/uL (ref 4.0–10.5)
nRBC: 0 % (ref 0.0–0.2)

## 2020-02-06 LAB — BASIC METABOLIC PANEL
Anion gap: 10 (ref 5–15)
BUN: 43 mg/dL — ABNORMAL HIGH (ref 8–23)
CO2: 25 mmol/L (ref 22–32)
Calcium: 8.9 mg/dL (ref 8.9–10.3)
Chloride: 101 mmol/L (ref 98–111)
Creatinine, Ser: 1.12 mg/dL (ref 0.61–1.24)
GFR calc Af Amer: >60 mL/min (ref >60)
GFR calc non Af Amer: 56 mL/min — ABNORMAL LOW (ref >60)
Glucose, Bld: 82 mg/dL (ref 70–99)
Potassium: 3.9 mmol/L (ref 3.5–5.1)
Sodium: 136 mmol/L (ref 135–145)

## 2020-03-26 ENCOUNTER — Encounter
Admission: RE | Admit: 2020-03-26 | Discharge: 2020-03-26 | Disposition: A | Discharge status: Home / Self Care | Payer: Medicare Other | Source: Ambulatory Visit | Attending: Internal Medicine | Admitting: Internal Medicine

## 2020-06-11 ENCOUNTER — Non-Acute Institutional Stay: Payer: Medicare Other | Admitting: Adult Health Nurse Practitioner

## 2020-06-11 ENCOUNTER — Other Ambulatory Visit: Payer: Self-pay

## 2020-06-11 DIAGNOSIS — Z515 Encounter for palliative care: Secondary | ICD-10-CM

## 2020-06-11 DIAGNOSIS — I679 Cerebrovascular disease, unspecified: Secondary | ICD-10-CM

## 2020-06-11 DIAGNOSIS — S82891D Other fracture of right lower leg, subsequent encounter for closed fracture with routine healing: Secondary | ICD-10-CM

## 2020-06-11 DIAGNOSIS — R4189 Other symptoms and signs involving cognitive functions and awareness: Secondary | ICD-10-CM

## 2020-06-11 NOTE — Progress Notes (Signed)
[facility] Consult Note Telephone: [phone]  Fax: [phone]  PATIENT NAME: Geoffrey Barber DOB: 11-27-1924 MRN: [id number]  PRIMARY CARE PROVIDER:   Dr. Frazier Richards  REFERRING PROVIDER:  Dr. Frazier Richards  RESPONSIBLE PARTY:   Foday Cone, spouse [phone] Lenox Bink, son [phone] Archit Leger, son [phone]  Chief complaint: Initial palliative consult    RECOMMENDATIONS and PLAN:  1.  Advanced care planning.  Patient is full code.  Discussed with patient complex medical decision making related to to future medical interventions.  Patient is unsure if he wants CPR done or not.  States that he and his wife have discussed this together.  States that he would like to talk to the chaplain that used to be at the facility.  Have reached out to staff who said that that chaplain is no longer there and that they will reach out to current chaplain for this discussion.  Wife in agreement with him talking to chaplain.  Did spend 20 minutes total discussing ACP with patient and wife.  2.  Cognitive impairment.  Patient is having forgetfulness and memory loss.  He is alert and oriented to person and place.  Does have anxiety and started on Lexapro 5 mg daily.  This is not being effective.  This is just been started a couple weeks ago and recommend titrating up as needed for therapeutic effectiveness.  3.  Right ankle fracture.  Continue follow-up and recommendations by Ortho.  Continue nonweightbearing until cleared by Ortho.  Palliative will continue to monitor for symptom management/decline and make recommendations as needed.  Follow-up in 2 to 4 weeks.  HISTORY OF PRESENT ILLNESS:  Geoffrey Barber is a 85 y.o. year old male with multiple medical problems including cerebrovascular disease.  Family history does include one of his sons that died at 59 years old of massive heart attack in one of his sisters who has history of heart  disease.  Palliative Care was asked to help address goals of care.  Patient had been living in ALF until July of this year.  01/22/2020 he was seen in ED for cystitis.  7/9 through 01/30/2020 he was in hospital after a fall with resultant hip pain.  X-rays show no acute fractures but do show chronic fracture of pubic bodies and adjacent pubic rami with resultant deformities.  After hospitalization patient has been in SNF for short-term rehab and he is now a long-term care resident in SNF.  History obtained from patient, wife, facility staff, chart review.  Of note imaging during hospitalization showed thickening of bladder wall suspicious of malignancy.  Asked patient and wife about this and neither one of them was aware of this.  Due to patient's frailty do not think benefits of further work-up or treatment would outweigh the risks.  Patient has had fall in October resulting in right ankle fracture.  He is nonweightbearing on this at the time and wears a foot immobilizer.  Has follow-up with Ortho later this month.  Staff does report that he was able to use walker when he was in ALF and was able to assist with ADLs.  He now is bed and wheelchair bound requiring assistance with ADLs.  He is incontinent of bowel and bladder.  Patient does not have diagnosis of dementia but is showing signs of cognitive impairment.  Patient is alert and oriented to person and place.  With visualization of calendar can tell you month and year.  Is  unable to tell you day of the week.  Staff reports his appetite is good and weight stable at around 220 pounds with BMI of 29.94.  Staff does report that he has some reddened areas under immobilizer.  This is being managed with Skin-Prep.  Staff reports that he does have some anxiety was recently started on Lexapro 5 mg but staff feels like this has not made any difference.  Rest of 10 point ROS asked and negative.  CODE STATUS: full code  PPS: 30% HOSPICE ELIGIBILITY/DIAGNOSIS:  TBD  PHYSICAL EXAM:  BP 138/72 HR 62 O2 95% on RA General: NAD, frail appearing Eyes: sclera anicteric and non injected with no drainage noted ENMT: moist mucous membranes Cardiovascular: regular rate and rhythm Pulmonary: clear ant fields; normal respiratory rate Abdomen: soft, nontender, + bowel sounds Extremities: no edema, foot immobilizer to right lower leg Skin: no rashes on exposed skin Neurological:  A&O to person and place   PAST MEDICAL HISTORY:  Past Medical History:  Diagnosis Date  . Anemia   . Arthritis   . BPH without obstruction/lower urinary tract symptoms   . Difficulty walking   . GERD (gastroesophageal reflux disease)   . Hypertension   . Irradiation cystitis with hematuria   . Muscle weakness   . PE (pulmonary embolism)   . Pneumonia   . Prostate cancer ([place])   . UTI (lower urinary tract infection)     SOCIAL HX:  Social History   Tobacco Use  . Smoking status: Former Research scientist (life sciences)  . Smokeless tobacco: Never Used  Substance Use Topics  . Alcohol use: Yes    Alcohol/week: 6.0 standard drinks    Types: 6 Glasses of wine per week    ALLERGIES:  Allergies  Allergen Reactions  . Furosemide Rash     PERTINENT MEDICATIONS:  Outpatient Encounter Medications as of 06/11/2020  Medication Sig  . acetaminophen (TYLENOL) 325 MG tablet Take 975 mg by mouth 3 (three) times daily.  Marland Kitchen acidophilus (RISAQUAD) CAPS capsule Take 2 capsules by mouth 3 (three) times daily.  Marland Kitchen amLODipine (NORVASC) 5 MG tablet Take 5 mg by mouth daily.  . calcium carbonate (CALCIUM 600) 600 MG TABS tablet Take 600 mg by mouth 2 (two) times daily with a meal.  . Calcium Carbonate-Vitamin D (CALCIUM 600+D) 600-400 MG-UNIT tablet Take 2 tablets by mouth 2 (two) times daily.  . carboxymethylcellulose (REFRESH PLUS) 0.5 % SOLN Place 1 drop into both eyes 4 (four) times daily.   . clopidogrel (PLAVIX) 75 MG tablet Take 75 mg by mouth daily.  . dapsone 25 MG tablet Take 25 mg by mouth See  admin instructions. Take 1 tablet daily on Sunday, Monday, Wednesday, and Friday.  . diclofenac Sodium (VOLTAREN) 1 % GEL Apply 2 g topically 2 (two) times daily as needed.  . ferrous sulfate 325 (65 FE) MG tablet Take 325 mg by mouth daily with breakfast.  . hydrochlorothiazide (MICROZIDE) 12.5 MG capsule Take 12.5 mg by mouth daily.   Marland Kitchen lidocaine (LIDODERM) 5 % Place 1 patch onto the skin daily. Remove & Discard patch within 12 hours or as directed by MD  . lisinopril (PRINIVIL,ZESTRIL) 20 MG tablet Take 10 mg by mouth daily.   . Multiple Vitamins-Minerals (PRESERVISION AREDS 2+MULTI VIT PO) Take 1 capsule by mouth 2 (two) times daily.   Marland Kitchen omeprazole (PRILOSEC) 20 MG capsule Take 20 mg by mouth daily.  . ondansetron (ZOFRAN ODT) 4 MG disintegrating tablet Take 1 tablet (4 mg total) by  mouth every 8 (eight) hours as needed for nausea or vomiting.  . polyethylene glycol (MIRALAX / GLYCOLAX) 17 g packet Take 17 g by mouth daily as needed for mild constipation.  . tamsulosin (FLOMAX) 0.4 MG CAPS capsule Take 0.8 mg by mouth daily.   . Turmeric, Curcuma Longa, (TURMERIC ROOT) POWD Take 500 mg by mouth daily.  . WHITE PETROLATUM-MINERAL OIL OP Apply 1 application to eye at bedtime.   No facility-administered encounter medications on file as of 06/11/2020.     Jaymison Luber Jenetta Downer, NP

## 2022-07-20 DEATH — deceased
